# Patient Record
Sex: Female | Born: 1950 | Race: Black or African American | Hispanic: No | Marital: Single | Smoking: Never smoker
Health system: Southern US, Community
[De-identification: ages and names within clinical notes are randomized; demographics above are authoritative.]

## PROBLEM LIST (undated history)

## (undated) DIAGNOSIS — R609 Edema, unspecified: Secondary | ICD-10-CM

## (undated) DIAGNOSIS — I10 Essential (primary) hypertension: Secondary | ICD-10-CM

## (undated) DIAGNOSIS — E785 Hyperlipidemia, unspecified: Secondary | ICD-10-CM

## (undated) DIAGNOSIS — E669 Obesity, unspecified: Secondary | ICD-10-CM

## (undated) HISTORY — PX: CATARACT EXTRACTION: SUR2

## (undated) HISTORY — DX: Hyperlipidemia, unspecified: E78.5

## (undated) HISTORY — DX: Essential (primary) hypertension: I10

## (undated) HISTORY — DX: Obesity, unspecified: E66.9

## (undated) HISTORY — DX: Edema, unspecified: R60.9

## (undated) HISTORY — PX: EYE SURGERY: SHX253

---

## 1998-09-21 ENCOUNTER — Ambulatory Visit (HOSPITAL_COMMUNITY): Admission: RE | Admit: 1998-09-21 | Discharge: 1998-09-21 | Payer: Self-pay

## 2002-10-17 ENCOUNTER — Ambulatory Visit (HOSPITAL_COMMUNITY): Admission: RE | Admit: 2002-10-17 | Discharge: 2002-10-17 | Payer: Self-pay | Admitting: Family Medicine

## 2003-12-12 ENCOUNTER — Ambulatory Visit (HOSPITAL_COMMUNITY): Admission: RE | Admit: 2003-12-12 | Discharge: 2003-12-12 | Payer: Self-pay | Admitting: Family Medicine

## 2004-01-07 ENCOUNTER — Ambulatory Visit: Payer: Self-pay | Admitting: Family Medicine

## 2004-01-09 ENCOUNTER — Ambulatory Visit: Payer: Self-pay | Admitting: Family Medicine

## 2004-02-04 ENCOUNTER — Ambulatory Visit: Payer: Self-pay | Admitting: *Deleted

## 2004-04-14 ENCOUNTER — Ambulatory Visit: Payer: Self-pay | Admitting: Family Medicine

## 2004-05-10 ENCOUNTER — Ambulatory Visit: Payer: Self-pay | Admitting: Family Medicine

## 2004-06-15 ENCOUNTER — Emergency Department (HOSPITAL_COMMUNITY): Admission: EM | Admit: 2004-06-15 | Discharge: 2004-06-15 | Payer: Self-pay | Admitting: Emergency Medicine

## 2004-06-18 ENCOUNTER — Ambulatory Visit: Payer: Self-pay | Admitting: Family Medicine

## 2004-08-04 ENCOUNTER — Ambulatory Visit: Payer: Self-pay | Admitting: Family Medicine

## 2005-01-03 ENCOUNTER — Ambulatory Visit: Payer: Self-pay | Admitting: Family Medicine

## 2005-01-04 ENCOUNTER — Ambulatory Visit: Payer: Self-pay | Admitting: Family Medicine

## 2005-02-21 ENCOUNTER — Ambulatory Visit: Payer: Self-pay | Admitting: Family Medicine

## 2005-05-02 ENCOUNTER — Ambulatory Visit: Payer: Self-pay | Admitting: Family Medicine

## 2005-05-11 ENCOUNTER — Ambulatory Visit (HOSPITAL_COMMUNITY): Admission: RE | Admit: 2005-05-11 | Discharge: 2005-05-11 | Payer: Self-pay | Admitting: Family Medicine

## 2005-07-11 ENCOUNTER — Ambulatory Visit: Payer: Self-pay | Admitting: Family Medicine

## 2005-08-23 ENCOUNTER — Ambulatory Visit: Payer: Self-pay | Admitting: Family Medicine

## 2005-11-04 ENCOUNTER — Ambulatory Visit: Payer: Self-pay | Admitting: Family Medicine

## 2006-01-02 ENCOUNTER — Ambulatory Visit: Payer: Self-pay | Admitting: Family Medicine

## 2006-03-22 ENCOUNTER — Ambulatory Visit: Payer: Self-pay | Admitting: Family Medicine

## 2006-05-30 ENCOUNTER — Encounter (INDEPENDENT_AMBULATORY_CARE_PROVIDER_SITE_OTHER): Payer: Self-pay | Admitting: Family Medicine

## 2006-05-30 ENCOUNTER — Ambulatory Visit: Payer: Self-pay | Admitting: Family Medicine

## 2006-05-31 ENCOUNTER — Encounter (INDEPENDENT_AMBULATORY_CARE_PROVIDER_SITE_OTHER): Payer: Self-pay | Admitting: Family Medicine

## 2006-06-20 ENCOUNTER — Ambulatory Visit: Payer: Self-pay | Admitting: Family Medicine

## 2006-06-29 ENCOUNTER — Ambulatory Visit (HOSPITAL_COMMUNITY): Admission: RE | Admit: 2006-06-29 | Discharge: 2006-06-29 | Payer: Self-pay | Admitting: Family Medicine

## 2006-07-22 ENCOUNTER — Emergency Department (HOSPITAL_COMMUNITY): Admission: EM | Admit: 2006-07-22 | Discharge: 2006-07-22 | Payer: Self-pay | Admitting: Emergency Medicine

## 2006-10-02 ENCOUNTER — Ambulatory Visit: Payer: Self-pay | Admitting: Internal Medicine

## 2006-10-09 ENCOUNTER — Encounter (INDEPENDENT_AMBULATORY_CARE_PROVIDER_SITE_OTHER): Payer: Self-pay | Admitting: Family Medicine

## 2006-10-09 DIAGNOSIS — I10 Essential (primary) hypertension: Secondary | ICD-10-CM | POA: Insufficient documentation

## 2006-10-09 DIAGNOSIS — E785 Hyperlipidemia, unspecified: Secondary | ICD-10-CM | POA: Insufficient documentation

## 2006-10-09 DIAGNOSIS — E119 Type 2 diabetes mellitus without complications: Secondary | ICD-10-CM

## 2006-10-09 DIAGNOSIS — G510 Bell's palsy: Secondary | ICD-10-CM | POA: Insufficient documentation

## 2006-10-09 DIAGNOSIS — D509 Iron deficiency anemia, unspecified: Secondary | ICD-10-CM | POA: Insufficient documentation

## 2006-10-09 DIAGNOSIS — E1142 Type 2 diabetes mellitus with diabetic polyneuropathy: Secondary | ICD-10-CM | POA: Insufficient documentation

## 2006-10-09 DIAGNOSIS — E669 Obesity, unspecified: Secondary | ICD-10-CM | POA: Insufficient documentation

## 2006-12-04 ENCOUNTER — Ambulatory Visit: Payer: Self-pay | Admitting: Internal Medicine

## 2006-12-04 ENCOUNTER — Encounter (INDEPENDENT_AMBULATORY_CARE_PROVIDER_SITE_OTHER): Payer: Self-pay | Admitting: Family Medicine

## 2006-12-04 LAB — CONVERTED CEMR LAB
BUN: 16 mg/dL (ref 6–23)
CO2: 22 meq/L (ref 19–32)
Calcium: 9.3 mg/dL (ref 8.4–10.5)
Chloride: 107 meq/L (ref 96–112)
Cholesterol: 149 mg/dL (ref 0–200)
Creatinine, Ser: 0.8 mg/dL (ref 0.40–1.20)
HDL: 79 mg/dL (ref >39)
LDL Cholesterol: 63 mg/dL (ref 0–99)
Total CHOL/HDL Ratio: 1.9
VLDL: 7 mg/dL (ref 0–40)

## 2007-01-10 ENCOUNTER — Encounter (INDEPENDENT_AMBULATORY_CARE_PROVIDER_SITE_OTHER): Payer: Self-pay | Admitting: *Deleted

## 2007-04-16 ENCOUNTER — Ambulatory Visit: Payer: Self-pay | Admitting: Internal Medicine

## 2007-04-16 ENCOUNTER — Encounter (INDEPENDENT_AMBULATORY_CARE_PROVIDER_SITE_OTHER): Payer: Self-pay | Admitting: Family Medicine

## 2007-04-16 LAB — CONVERTED CEMR LAB
BUN: 13 mg/dL (ref 6–23)
CO2: 23 meq/L (ref 19–32)
Cholesterol: 170 mg/dL (ref 0–200)
Glucose, Bld: 105 mg/dL — ABNORMAL HIGH (ref 70–99)
HDL: 83 mg/dL (ref >39)
Potassium: 4.1 meq/L (ref 3.5–5.3)
Sodium: 138 meq/L (ref 135–145)
Total CHOL/HDL Ratio: 2

## 2007-05-11 ENCOUNTER — Encounter (INDEPENDENT_AMBULATORY_CARE_PROVIDER_SITE_OTHER): Payer: Self-pay | Admitting: Internal Medicine

## 2007-05-11 ENCOUNTER — Ambulatory Visit: Payer: Self-pay | Admitting: Family Medicine

## 2007-07-16 ENCOUNTER — Ambulatory Visit (HOSPITAL_COMMUNITY): Admission: RE | Admit: 2007-07-16 | Discharge: 2007-07-16 | Payer: Self-pay | Admitting: Family Medicine

## 2007-08-29 ENCOUNTER — Ambulatory Visit: Payer: Self-pay | Admitting: Internal Medicine

## 2008-02-27 ENCOUNTER — Ambulatory Visit: Payer: Self-pay | Admitting: Internal Medicine

## 2008-02-27 LAB — CONVERTED CEMR LAB
BUN: 14 mg/dL (ref 6–23)
CO2: 24 meq/L (ref 19–32)
Creatinine, Ser: 0.73 mg/dL (ref 0.40–1.20)
Potassium: 4 meq/L (ref 3.5–5.3)

## 2008-08-07 ENCOUNTER — Ambulatory Visit (HOSPITAL_COMMUNITY): Admission: RE | Admit: 2008-08-07 | Discharge: 2008-08-07 | Payer: Self-pay | Admitting: Internal Medicine

## 2008-09-19 ENCOUNTER — Ambulatory Visit: Payer: Self-pay | Admitting: Family Medicine

## 2008-10-28 ENCOUNTER — Ambulatory Visit: Payer: Self-pay | Admitting: Internal Medicine

## 2008-12-15 ENCOUNTER — Ambulatory Visit: Payer: Self-pay | Admitting: Internal Medicine

## 2008-12-15 ENCOUNTER — Encounter (INDEPENDENT_AMBULATORY_CARE_PROVIDER_SITE_OTHER): Payer: Self-pay | Admitting: Adult Health

## 2008-12-15 LAB — CONVERTED CEMR LAB
Albumin: 4 g/dL (ref 3.5–5.2)
Alkaline Phosphatase: 74 units/L (ref 39–117)
BUN: 11 mg/dL (ref 6–23)
CO2: 23 meq/L (ref 19–32)
Chlamydia, DNA Probe: NEGATIVE
Creatinine, Ser: 0.85 mg/dL (ref 0.40–1.20)
Eosinophils Relative: 1 % (ref 0–5)
HDL: 76 mg/dL (ref >39)
MCV: 80.9 fL (ref 78.0–100.0)
Monocytes Relative: 6 % (ref 3–12)
Neutro Abs: 2.7 10*3/uL (ref 1.7–7.7)
Neutrophils Relative %: 55 % (ref 43–77)
Platelets: 269 10*3/uL (ref 150–400)
RBC: 4.6 M/uL (ref 3.87–5.11)
RDW: 16.5 % — ABNORMAL HIGH (ref 11.5–15.5)
Total Bilirubin: 0.3 mg/dL (ref 0.3–1.2)
Total Protein: 7.7 g/dL (ref 6.0–8.3)

## 2009-02-25 ENCOUNTER — Ambulatory Visit: Payer: Self-pay | Admitting: Internal Medicine

## 2009-04-08 ENCOUNTER — Ambulatory Visit: Payer: Self-pay | Admitting: Internal Medicine

## 2009-05-13 ENCOUNTER — Ambulatory Visit: Payer: Self-pay | Admitting: Family Medicine

## 2009-06-08 ENCOUNTER — Ambulatory Visit: Payer: Self-pay | Admitting: Internal Medicine

## 2009-06-08 ENCOUNTER — Encounter (INDEPENDENT_AMBULATORY_CARE_PROVIDER_SITE_OTHER): Payer: Self-pay | Admitting: Adult Health

## 2009-06-08 LAB — CONVERTED CEMR LAB
ALT: 17 units/L (ref 0–35)
AST: 17 units/L (ref 0–37)
Albumin: 4.3 g/dL (ref 3.5–5.2)
Calcium: 10.1 mg/dL (ref 8.4–10.5)
Chloride: 100 meq/L (ref 96–112)
Cholesterol: 149 mg/dL (ref 0–200)
Creatinine, Ser: 0.86 mg/dL (ref 0.40–1.20)
Glucose, Bld: 140 mg/dL — ABNORMAL HIGH (ref 70–99)
HDL: 69 mg/dL (ref >39)
Hgb A1c MFr Bld: 9.6 % — ABNORMAL HIGH (ref 4.6–6.1)
LDL Cholesterol: 67 mg/dL (ref 0–99)
Sodium: 139 meq/L (ref 135–145)
Total Protein: 7.9 g/dL (ref 6.0–8.3)
Triglycerides: 66 mg/dL (ref <150)
VLDL: 13 mg/dL (ref 0–40)

## 2009-07-07 ENCOUNTER — Ambulatory Visit: Payer: Self-pay | Admitting: Internal Medicine

## 2009-09-02 ENCOUNTER — Ambulatory Visit: Payer: Self-pay | Admitting: Internal Medicine

## 2009-09-04 ENCOUNTER — Ambulatory Visit (HOSPITAL_COMMUNITY): Admission: RE | Admit: 2009-09-04 | Discharge: 2009-09-04 | Payer: Self-pay | Admitting: Internal Medicine

## 2009-10-13 ENCOUNTER — Ambulatory Visit: Payer: Self-pay | Admitting: Internal Medicine

## 2009-11-10 ENCOUNTER — Ambulatory Visit: Payer: Self-pay | Admitting: Internal Medicine

## 2009-11-10 ENCOUNTER — Encounter (INDEPENDENT_AMBULATORY_CARE_PROVIDER_SITE_OTHER): Payer: Self-pay | Admitting: Adult Health

## 2009-11-10 LAB — CONVERTED CEMR LAB: Microalb, Ur: 0.5 mg/dL (ref 0.00–1.89)

## 2010-07-08 ENCOUNTER — Encounter (INDEPENDENT_AMBULATORY_CARE_PROVIDER_SITE_OTHER): Payer: Self-pay | Admitting: *Deleted

## 2010-07-08 LAB — CONVERTED CEMR LAB
BUN: 15 mg/dL (ref 6–23)
Basophils Absolute: 0 10*3/uL (ref 0.0–0.1)
CO2: 26 meq/L (ref 19–32)
Calcium: 9.7 mg/dL (ref 8.4–10.5)
Creatinine, Ser: 0.87 mg/dL (ref 0.40–1.20)
Eosinophils Relative: 1 % (ref 0–5)
HCT: 37.5 % (ref 36.0–46.0)
HDL: 76 mg/dL (ref >39)
Lymphocytes Relative: 38 % (ref 12–46)
MCHC: 32 g/dL (ref 30.0–36.0)
Monocytes Absolute: 0.5 10*3/uL (ref 0.1–1.0)
Potassium: 4.5 meq/L (ref 3.5–5.3)
RDW: 15.6 % — ABNORMAL HIGH (ref 11.5–15.5)
TSH: 0.78 microintl units/mL (ref 0.350–4.500)
Total Bilirubin: 0.4 mg/dL (ref 0.3–1.2)
Total Protein: 7.8 g/dL (ref 6.0–8.3)
VLDL: 11 mg/dL (ref 0–40)

## 2010-09-27 ENCOUNTER — Other Ambulatory Visit (HOSPITAL_COMMUNITY): Payer: Self-pay | Admitting: Family Medicine

## 2010-09-27 DIAGNOSIS — Z1231 Encounter for screening mammogram for malignant neoplasm of breast: Secondary | ICD-10-CM

## 2010-10-07 ENCOUNTER — Ambulatory Visit (HOSPITAL_COMMUNITY): Payer: Self-pay

## 2010-10-20 ENCOUNTER — Ambulatory Visit (HOSPITAL_COMMUNITY)
Admission: RE | Admit: 2010-10-20 | Discharge: 2010-10-20 | Disposition: A | Discharge status: Home / Self Care | Payer: Self-pay | Source: Ambulatory Visit | Attending: Family Medicine | Admitting: Family Medicine

## 2010-10-20 DIAGNOSIS — Z1231 Encounter for screening mammogram for malignant neoplasm of breast: Secondary | ICD-10-CM | POA: Insufficient documentation

## 2011-08-07 ENCOUNTER — Emergency Department (HOSPITAL_COMMUNITY): Payer: Self-pay

## 2011-08-07 ENCOUNTER — Emergency Department (HOSPITAL_COMMUNITY)
Admission: EM | Admit: 2011-08-07 | Discharge: 2011-08-07 | Disposition: A | Discharge status: Home / Self Care | Payer: Self-pay | Attending: Emergency Medicine | Admitting: Emergency Medicine

## 2011-08-07 ENCOUNTER — Encounter (HOSPITAL_COMMUNITY): Payer: Self-pay | Admitting: *Deleted

## 2011-08-07 DIAGNOSIS — I10 Essential (primary) hypertension: Secondary | ICD-10-CM | POA: Insufficient documentation

## 2011-08-07 DIAGNOSIS — M25551 Pain in right hip: Secondary | ICD-10-CM

## 2011-08-07 DIAGNOSIS — M25559 Pain in unspecified hip: Secondary | ICD-10-CM | POA: Insufficient documentation

## 2011-08-07 DIAGNOSIS — E119 Type 2 diabetes mellitus without complications: Secondary | ICD-10-CM | POA: Insufficient documentation

## 2011-08-07 HISTORY — DX: Essential (primary) hypertension: I10

## 2011-08-07 LAB — GLUCOSE, CAPILLARY: Glucose-Capillary: 284 mg/dL — ABNORMAL HIGH (ref 70–99)

## 2011-08-07 MED ORDER — IBUPROFEN 800 MG PO TABS: 800.0000 mg | ORAL_TABLET | Freq: Three times a day (TID) | ORAL | Status: AC | PRN

## 2011-08-07 MED ORDER — DIAZEPAM 5 MG PO TABS: 5.0000 mg | ORAL_TABLET | Freq: Three times a day (TID) | ORAL | Status: AC | PRN

## 2011-08-07 NOTE — ED Provider Notes (Signed)
History     CSN: [id number]  Arrival date & time 08/07/11  1016   First MD Initiated Contact with Patient 08/07/11 1221      Chief Complaint  Patient presents with  . Hip Pain    (Consider location/radiation/quality/duration/timing/severity/associated sxs/prior treatment) HPI Comments: Patient reports right hip pain that began while walking 4 days ago.  States the pain is exacerbated with walking and putting pressure on her leg.  States if she is standing still it does not hurt.  Pain is described as sharp and 10/10.  She has taken aspirin without improvement.  Denies fall or other injury.  Has never been diagnosed with osteoporosis.  Denies back pain, abdominal pain, loss of control of bowel or bladder, weakness or numbness of the legs.   Patient is a 61 y.o. female presenting with hip pain. The history is provided by the patient.  Hip Pain Pertinent negatives include no abdominal pain, chest pain, fever, numbness, vomiting or weakness.    Past Medical History  Diagnosis Date  . Diabetes mellitus   . Hypertension     Past Surgical History  Procedure Date  . Cesarean section     No family history on file.  History  Substance Use Topics  . Smoking status: Never Smoker   . Smokeless tobacco: Not on file  . Alcohol Use: No    OB History    Grav Para Term Preterm Abortions TAB SAB Ect Mult Living                  Review of Systems  Constitutional: Negative for fever.  Respiratory: Negative for shortness of breath.   Cardiovascular: Negative for chest pain.  Gastrointestinal: Negative for vomiting, abdominal pain and diarrhea.  Musculoskeletal: Negative for back pain.  Neurological: Negative for weakness and numbness.    Allergies  Hydrochlorothiazide  Home Medications   Current Outpatient Rx  Name Route Sig Dispense Refill  . ASPIRIN EC 81 MG PO TBEC Oral Take 81 mg by mouth daily.    . FUROSEMIDE 40 MG PO TABS Oral Take 40 mg by mouth daily.    Marland Kitchen  GLIPIZIDE ER 10 MG PO TB24 Oral Take 10 mg by mouth daily.    Marland Kitchen LOSARTAN POTASSIUM 100 MG PO TABS Oral Take 100 mg by mouth daily.    Marland Kitchen METFORMIN HCL 1000 MG PO TABS Oral Take 1,000 mg by mouth 2 (two) times daily with a meal.    . METOPROLOL SUCCINATE ER 100 MG PO TB24 Oral Take 100 mg by mouth daily. Take with or immediately following a meal.    . ROSUVASTATIN CALCIUM 5 MG PO TABS Oral Take 5 mg by mouth daily.    Marland Kitchen TERAZOSIN HCL 1 MG PO CAPS Oral Take 1 mg by mouth at bedtime.      BP 175/80  Pulse 88  Temp(Src) 98.6 F (37 C) (Oral)  Resp 20  SpO2 99%  Physical Exam  Nursing note and vitals reviewed. Constitutional: She is oriented to person, place, and time. She appears well-developed and well-nourished.  HENT:  Head: Normocephalic and atraumatic.  Neck: Neck supple.  Pulmonary/Chest: Effort normal.  Musculoskeletal: She exhibits no edema.       Right hip: She exhibits tenderness and bony tenderness. She exhibits no crepitus and no deformity.       Legs:      Right hip tender to palpation laterally.  Exam is limited due to patient's body habitus.  Distal pulses intact,  sensation intact.  Pt stands with no pain or difficulty, pain with flexing hip and walking forward.  Neurological: She is alert and oriented to person, place, and time.    ED Course  Procedures (including critical care time)  Labs Reviewed  GLUCOSE, CAPILLARY - Abnormal; Notable for the following:    Glucose-Capillary 284 (*)    All other components within normal limits   Dg Hip Complete Right  08/07/2011  *RADIOLOGY REPORT*  Clinical Data: Right hip pain.  RIGHT HIP - COMPLETE 2+ VIEW  Comparison: None.  Findings: The right hip is located.  No acute bone or soft tissue abnormality is evident.  Mild degenerative changes are noted in the SI joints bilaterally.  IMPRESSION:  1.  Negative right hip. 2.  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  Original Report Authenticated By: Jamesetta Orleans.  MATTERN, M.D.   Ct Hip Right Wo Contrast  08/07/2011  *RADIOLOGY REPORT*  Clinical Data: Right hip pain with limited weightbearing.  No known injury.  CT OF THE RIGHT HIP WITHOUT CONTRAST  Technique:  Multidetector CT imaging was performed according to the standard protocol. Multiplanar CT image reconstructions were also generated.  Comparison: Radiographs same date.  Findings: Study is limited to the right hip and inferior right hemi pelvis.  The mineralization and alignment are normal.  There is no evidence of acute fracture or dislocation.  There is no evidence of femoral head osteonecrosis.  The hip joint spaces appear adequately maintained.  There are enthesophytes at the greater and lesser trochanters.  Degenerative changes at the symphysis pubis and right sacroiliac joint are partially imaged.  No significant hip joint effusion or periarticular soft tissue abnormality is identified.  IMPRESSION:  1.  No acute right hip osseous findings. 2.  Mild degenerative changes at the symphysis pubis and sacroiliac joints, incompletely imaged.  Further evaluation with MRI should be considered if the patient has persistent unexplained pain, preferred modality in the atraumatic setting.  Original Report Authenticated By: Gerrianne Scale, M.D.     1. Right hip pain       MDM  Afebrile patient with right hip pain x 4 days.  Patient is able to stand and bear weight but has pain with walking and moving around.  Xray and CT of her hip are negative.  Patient has had no known injury.  Likely muscle strain or ligamentous strain.  Pt refused pain medication in the ED.  I d/c her home with ibuprofen and valium, orthopedic and PCP follow up.  Discussed return precautions, discussed fall risk with medications, advised her to avoid staying completely sedentary.  Patient verbalizes understanding and agrees with plan.          Dillard Cannon [place], [place] 08/07/11 1605

## 2011-08-07 NOTE — ED Notes (Signed)
Pt reports right sided hip pain that 10/10 when she puts weight on her right leg. Pt denies pain without movement or in a sitting or laying position.  Pt denies history of injury. Pain began on Thursday.  Pt denies history of back issues or moving/lifting heavy objects. Pt reports a history of pain to leg that was sometimes associated with swelling, but leg is currently not swollen or red.

## 2011-08-07 NOTE — Discharge Instructions (Signed)
Read the information below.  Please call the orthopedist for a follow up appointment.  Return to the ER if you have worsening pain, fevers, or weakness or numbness of your leg.  You may return to the ER at any time for worsening condition or any new symptoms that concern you.  Arthralgia Arthralgia is joint pain. A joint is a place where two bones meet. Joint pain can happen for many reasons. The joint can be bruised, stiff, infected, or weak from aging. Pain usually goes away after resting and taking medicine for soreness.  HOME CARE  Rest the joint as told by your doctor.   Keep the sore joint raised (elevated) for the first 24 hours.   Put ice on the joint area.   Put ice in a plastic bag.   Place a towel between your skin and the bag.   Leave the ice on for 15 to 20 minutes, 3 to 4 times a day.   Wear your splint, casting, elastic bandage, or sling as told by your doctor.   Only take medicine as told by your doctor. Do not take aspirin.   Use crutches as told by your doctor. Do not put weight on the joint until told to by your doctor.  GET HELP RIGHT AWAY IF:   You have bruising, puffiness (swelling), or more pain.   Your fingers or toes turn blue or start to lose feeling (numb).   Your medicine does not lessen the pain.   Your pain becomes severe.   You have a temperature by mouth above 102 F (38.9 C), not controlled by medicine.   You cannot move or use the joint.  MAKE SURE YOU:   Understand these instructions.   Will watch your condition.   Will get help right away if you are not doing well or get worse.  Document Released: 03/30/2009 Document Revised: 03/31/2011 Document Reviewed: 03/30/2009 [facility] Patient Information 2012 [place], [place].  Hip Pain The hips join the upper legs to the lower pelvis. The bones, cartilage, tendons, and muscles of the hip joint perform a lot of work each day holding your body weight and allowing you to move around. Hip pain is  a common symptom. It can range from a minor ache to severe pain on 1 or both hips. Pain may be felt on the inside of the hip joint near the groin, or the outside near the buttocks and upper thigh. There may be swelling or stiffness as well. It occurs more often when a person walks or performs activity. There are many reasons hip pain can develop. CAUSES  It is important to work with your caregiver to identify the cause since many conditions can impact the bones, cartilage, muscles, and tendons of the hips. Causes for hip pain include:  Broken (fractured) bones.   Separation of the thighbone from the hip socket (dislocation).   Torn cartilage of the hip joint.   Swelling (inflammation) of a tendon (tendonitis), the sac within the hip joint (bursitis), or a joint.   A weakening in the abdominal wall (hernia), affecting the nerves to the hip.   Arthritis in the hip joint or lining of the hip joint.   Pinched nerves in the back, hip, or upper thigh.   A bulging disc in the spine (herniated disc).   Rarely, bone infection or cancer.  DIAGNOSIS  The location of your hip pain will help your caregiver understand what may be causing the pain. A diagnosis is based  on your medical history, your symptoms, results from your physical exam, and results from diagnostic tests. Diagnostic tests may include X-ray exams, a computerized magnetic scan (magnetic resonance imaging, MRI), or bone scan. TREATMENT  Treatment will depend on the cause of your hip pain. Treatment may include:  Limiting activities and resting until symptoms improve.   Crutches or other walking supports (a cane or brace).   Ice, elevation, and compression.   Physical therapy or home exercises.   Shoe inserts or special shoes.   Losing weight.   Medications to reduce pain.   Undergoing surgery.  HOME CARE INSTRUCTIONS   Only take over-the-counter or prescription medicines for pain, discomfort, or fever as directed by your  caregiver.   Put ice on the injured area:   Put ice in a plastic bag.   Place a towel between your skin and the bag.   Leave the ice on for 15 to 20 minutes at a time, 3 to 4 times a day.   Keep your leg raised (elevated) when possible to lessen swelling.   Avoid activities that cause pain.   Follow specific exercises as directed by your caregiver.   Sleep with a pillow between your legs on your most comfortable side.   Record how often you have hip pain, the location of the pain, and what it feels like. This information may be helpful to you and your caregiver.   Ask your caregiver about returning to work or sports and whether you should drive.   Follow up with your caregiver for further exams, therapy, or testing as directed.  SEEK MEDICAL CARE IF:   Your pain or swelling continues or worsens after 1 week.   You are feeling unwell or have chills.   You have increasing difficulty with walking.   You have a loss of sensation or other new symptoms.   You have questions or concerns.  SEEK IMMEDIATE MEDICAL CARE IF:   You cannot put weight on the affected hip.   You have fallen.   You have a sudden increase in pain and swelling in your hip.   You have a fever.  MAKE SURE YOU:   Understand these instructions.   Will watch your condition.   Will get help right away if you are not doing well or get worse.  Document Released: 09/29/2009 Document Revised: 03/31/2011 Document Reviewed: 09/29/2009 [facility] Patient Information 2012 [place], [place].

## 2011-08-09 NOTE — ED Provider Notes (Signed)
Medical screening examination/treatment/procedure(s) were performed by non-physician practitioner and as supervising physician I was immediately available for consultation/collaboration.   Geoffery Lyons, MD 08/09/11 [phone]

## 2011-11-14 ENCOUNTER — Other Ambulatory Visit (HOSPITAL_COMMUNITY): Payer: Self-pay | Admitting: Family Medicine

## 2011-11-14 DIAGNOSIS — Z1231 Encounter for screening mammogram for malignant neoplasm of breast: Secondary | ICD-10-CM

## 2011-12-02 ENCOUNTER — Ambulatory Visit (HOSPITAL_COMMUNITY)
Admission: RE | Admit: 2011-12-02 | Discharge: 2011-12-02 | Disposition: A | Discharge status: Home / Self Care | Payer: Self-pay | Source: Ambulatory Visit | Attending: Family Medicine | Admitting: Family Medicine

## 2011-12-02 DIAGNOSIS — Z1231 Encounter for screening mammogram for malignant neoplasm of breast: Secondary | ICD-10-CM | POA: Insufficient documentation

## 2012-04-16 ENCOUNTER — Encounter (HOSPITAL_COMMUNITY): Payer: Self-pay

## 2012-04-16 ENCOUNTER — Emergency Department (HOSPITAL_COMMUNITY)
Admission: EM | Admit: 2012-04-16 | Discharge: 2012-04-16 | Disposition: A | Discharge status: Home / Self Care | Payer: No Typology Code available for payment source | Source: Home / Self Care

## 2012-04-16 DIAGNOSIS — E119 Type 2 diabetes mellitus without complications: Secondary | ICD-10-CM

## 2012-04-16 DIAGNOSIS — E785 Hyperlipidemia, unspecified: Secondary | ICD-10-CM

## 2012-04-16 DIAGNOSIS — I10 Essential (primary) hypertension: Secondary | ICD-10-CM

## 2012-04-16 DIAGNOSIS — E669 Obesity, unspecified: Secondary | ICD-10-CM

## 2012-04-16 MED ORDER — CLONIDINE HCL 0.1 MG PO TABS
ORAL_TABLET | ORAL | Status: AC
  Filled 2012-04-16: qty 1

## 2012-04-16 MED ORDER — ASPIRIN EC 81 MG PO TBEC: 81.0000 mg | DELAYED_RELEASE_TABLET | Freq: Every day | ORAL | Status: DC

## 2012-04-16 MED ORDER — FUROSEMIDE 40 MG PO TABS: 40.0000 mg | ORAL_TABLET | Freq: Every day | ORAL | Status: DC

## 2012-04-16 MED ORDER — TERAZOSIN HCL 1 MG PO CAPS: 2.0000 mg | ORAL_CAPSULE | Freq: Every day | ORAL | Status: DC

## 2012-04-16 MED ORDER — METFORMIN HCL 1000 MG PO TABS: 1000.0000 mg | ORAL_TABLET | Freq: Two times a day (BID) | ORAL | Status: DC

## 2012-04-16 MED ORDER — ASPIRIN EC 81 MG PO TBEC: 81.0000 mg | DELAYED_RELEASE_TABLET | Freq: Every day | ORAL | Status: AC

## 2012-04-16 MED ORDER — BENAZEPRIL HCL 40 MG PO TABS: 40.0000 mg | ORAL_TABLET | Freq: Every day | ORAL | Status: DC

## 2012-04-16 MED ORDER — METOPROLOL TARTRATE 50 MG PO TABS: 50.0000 mg | ORAL_TABLET | Freq: Two times a day (BID) | ORAL | Status: DC

## 2012-04-16 MED ORDER — CLONIDINE HCL 0.1 MG PO TABS
0.1000 mg | ORAL_TABLET | Freq: Once | ORAL | Status: AC
  Administered 2012-04-16: 0.1 mg via ORAL

## 2012-04-16 NOTE — ED Provider Notes (Addendum)
Patient Demographics  Ann Gomez, is a 61 y.o. female  WUJ:[id number]  NFA:[id number]  DOB - [birth year]  Chief Complaint  Patient presents with  . Hypertension        Subjective:   Ann Gomez today is here for a follow up vist/to establish primary care. Patient is supposed to take 4 antihypertensive medications, but she ran out of one and only takes 3 (not clear which she ran out of). Patient has No headache, No chest pain, No abdominal pain - No Nausea, No new weakness tingling or numbness, No Cough - SOB.   Objective:    Filed Vitals:   04/16/12 1032  BP: 206/93  Pulse: 70  Temp: 98 F (36.7 C)  TempSrc: Oral  Resp: 19  SpO2: 100%     ALLERGIES:   Allergies  Allergen Reactions  . Hydrochlorothiazide Other (See Comments)    dizzy    PAST MEDICAL HISTORY: Past Medical History  Diagnosis Date  . Diabetes mellitus   . Hypertension     PAST SURGICAL HISTORY: Past Surgical History  Procedure Date  . Cesarean section     FAMILY HISTORY: No family history of coronary artery disease or CVA Mother-bronchial asthma  MEDICATIONS AT HOME: Prior to Admission medications   Medication Sig Start Date End Date Taking? Authorizing Provider  aspirin EC 81 MG tablet Take 1 tablet (81 mg total) by mouth daily. 04/16/12   Shanker Levora Dredge, MD  benazepril (LOTENSIN) 40 MG tablet Take 1 tablet (40 mg total) by mouth daily. 04/16/12   Shanker Levora Dredge, MD  furosemide (LASIX) 40 MG tablet Take 1 tablet (40 mg total) by mouth daily. 04/16/12   Shanker Levora Dredge, MD  metFORMIN (GLUCOPHAGE) 1000 MG tablet Take 1 tablet (1,000 mg total) by mouth 2 (two) times daily with a meal. 04/16/12   Shanker Levora Dredge, MD  metoprolol (LOPRESSOR) 50 MG tablet Take 1 tablet (50 mg total) by mouth 2 (two) times daily. 04/16/12   Shanker Levora Dredge, MD  terazosin (HYTRIN) 1 MG capsule Take 2 capsules (2 mg total) by mouth at bedtime. 04/16/12   Shanker Levora Dredge, MD    REVIEW OF SYSTEMS:   Constitutional:   No   Fevers, chills, fatigue.  HEENT:    No headaches, Sore throat,   Cardio-vascular: No chest pain,  Orthopnea, swelling in lower extremities, anasarca, palpitations  GI:  No abdominal pain, nausea, vomiting, diarrhea  Resp: No shortness of breath,  No coughing up of blood.No cough.No wheezing.  Skin:  no rash or lesions.  GU:  no dysuria, change in color of urine, no urgency or frequency.  No flank pain.  Musculoskeletal: No joint pain or swelling.  No decreased range of motion.  No back pain.  Psych: No change in mood or affect. No depression or anxiety.  No memory loss.   Exam  General appearance :Awake, alert, not in any distress. Speech Clear. Not toxic Looking HEENT: Atraumatic and Normocephalic, pupils equally reactive to light and accomodation Neck: supple, no JVD. No cervical lymphadenopathy.  Chest:Good air entry bilaterally, no added sounds  CVS: S1 S2 regular, no murmurs.  Abdomen: Bowel sounds present, Non tender and not distended with no gaurding, rigidity or rebound. Extremities: B/L Lower Ext shows no edema, both legs are warm to touch Neurology: Awake alert, and oriented X 3, CN II-XII intact, Non focal Skin:No Rash Wounds:N/A    Data Review   CBC No results found for this basename: WBC:5,HGB:5,HCT:5,PLT:5,MCV:5,MCH:5,MCHC:5,RDW:5,NEUTRABS:5,LYMPHSABS:5,MONOABS:5,EOSABS:5,BASOSABS:5,BANDABS:5,BANDSABD:5 in the  last 168 hours  Chemistries   No results found for this basename: NA:5,K:5,CL:5,CO2:5,GLUCOSE:5,BUN:5,CREATININE:5,GFRCGP,:5,CALCIUM:5,MG:5,AST:5,ALT:5,ALKPHOS:5,BILITOT:5 in the last 168 hours ------------------------------------------------------------------------------------------------------------------ No results found for this basename: HGBA1C:2 in the last 72 hours ------------------------------------------------------------------------------------------------------------------ No results found for this  basename: CHOL:2,HDL:2,LDLCALC:2,TRIG:2,CHOLHDL:2,LDLDIRECT:2 in the last 72 hours ------------------------------------------------------------------------------------------------------------------ No results found for this basename: TSH,T4TOTAL,FREET3,T3FREE,THYROIDAB in the last 72 hours ------------------------------------------------------------------------------------------------------------------ No results found for this basename: VITAMINB12:2,FOLATE:2,FERRITIN:2,TIBC:2,IRON:2,RETICCTPCT:2 in the last 72 hours  Coagulation profile  No results found for this basename: INR:5,PROTIME:5 in the last 168 hours    Assessment & Plan   #1 uncontrolled hypertension - She's only taking three out of four for medications - Will give one dose of clonidine-recheck of BP- systolic blood pressure down in the 170s. She will discharge. - Patient on numerous medications-that are not on the 4 dollar list, we'll change her medications are the following  - Benazepril 40 mg daily (previously on Cozaar)  - Metoprolol 50 mg twice daily ( previously on Toprol-XL 100 mg daily)   - Terazosin 2 mg daily (previously on 1 mg)  - Lasix 40 mg daily Followup in one week - to recheck BP and further adjust medications   #2 diabetes - Continue with Lantus and metformin. Patient claims she has enough supply of Lantus last a month or so. - Check A1c next visit  #3 dyslipidemia pravastatin 40 mg daily (previously on Crestor 5 mg daily-calledd pharmacy- Equivalent dosing of Prilosec that is 40 mg of Pravachol)  Followup in one week to recheck blood pressure. Hopefully she can get her labs prior to next visit   Follow-up Information    Follow up with [facility]. Schedule an appointment as soon as possible for a visit in 7 days.          Maretta Bees, MD 04/16/12 1116  Dewayne Shorter Levora Dredge, MD 04/16/12 1229

## 2012-04-16 NOTE — ED Notes (Signed)
Medication refill- history of hypertension and DM

## 2012-05-14 ENCOUNTER — Emergency Department (HOSPITAL_COMMUNITY)
Admission: EM | Admit: 2012-05-14 | Discharge: 2012-05-14 | Disposition: A | Discharge status: Home / Self Care | Payer: No Typology Code available for payment source | Source: Home / Self Care

## 2012-05-14 ENCOUNTER — Encounter (HOSPITAL_COMMUNITY): Payer: Self-pay

## 2012-05-14 NOTE — ED Notes (Signed)
Here for blood pressure check only

## 2013-03-09 ENCOUNTER — Other Ambulatory Visit: Payer: Self-pay | Admitting: Internal Medicine

## 2013-10-16 ENCOUNTER — Encounter: Payer: Self-pay | Admitting: Cardiology

## 2013-10-16 DIAGNOSIS — R609 Edema, unspecified: Secondary | ICD-10-CM | POA: Insufficient documentation

## 2013-10-16 DIAGNOSIS — I10 Essential (primary) hypertension: Secondary | ICD-10-CM | POA: Insufficient documentation

## 2013-10-23 ENCOUNTER — Ambulatory Visit: Payer: No Typology Code available for payment source | Admitting: *Deleted

## 2013-11-03 IMAGING — CT CT HIP*R* W/O CM
1 series · 15 of 32 positions shown, 19 images · non-contrast
Comparison: Radiographs same date.

CLINICAL DATA: Right hip pain with limited weightbearing.  No known
injury.

CT OF THE RIGHT HIP WITHOUT CONTRAST
TECHNIQUE: Multidetector CT imaging was performed according to the
standard protocol. Multiplanar CT image reconstructions were also
generated.

[Series 3: hip st · axial · 0.46mm/px · z∈[+887,+1077]mm · 15 of 43 slices shown, 19 images]
[im 3/43  soft-tissue]
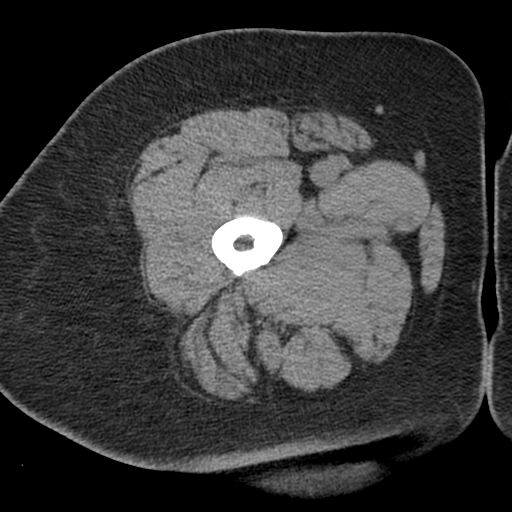
[im 3/43  bone]
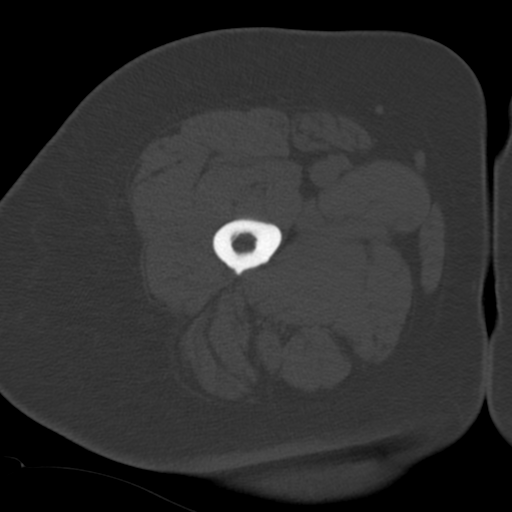
[im 6/43  soft-tissue]
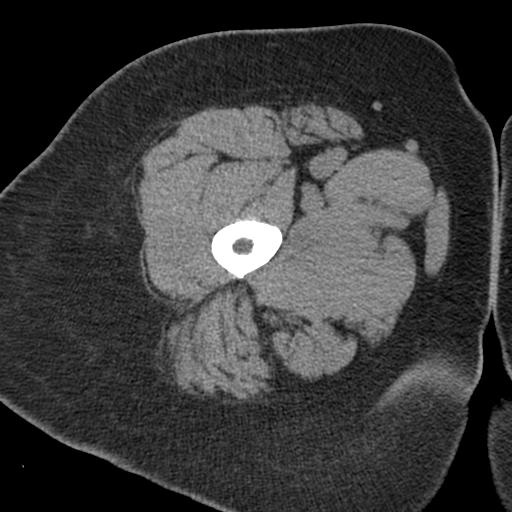
[im 9/43  soft-tissue]
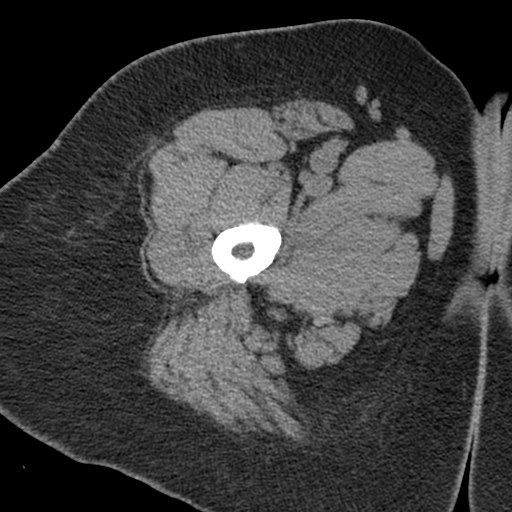
[im 13/43  soft-tissue]
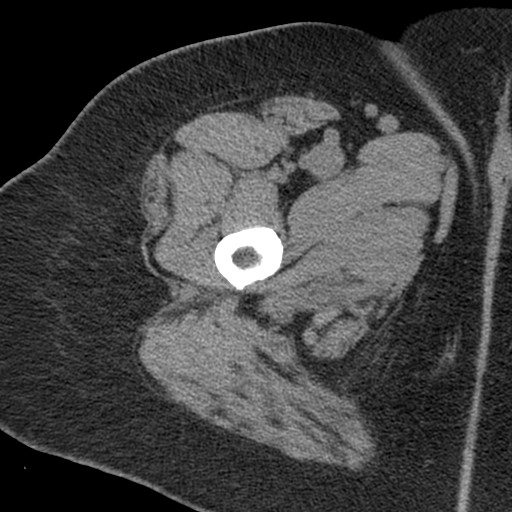
[im 15/43  soft-tissue]
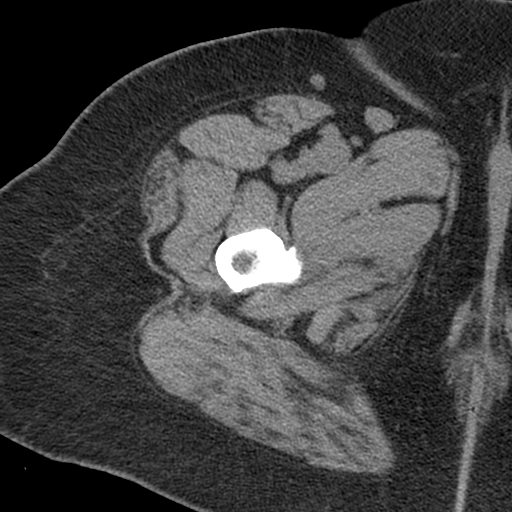
[im 18/43  soft-tissue]
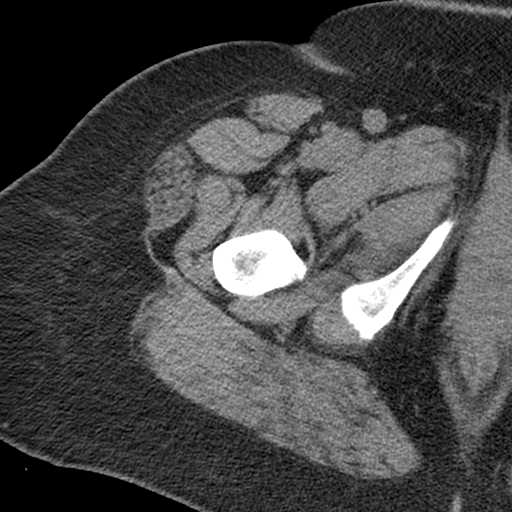
[im 22/43  soft-tissue]
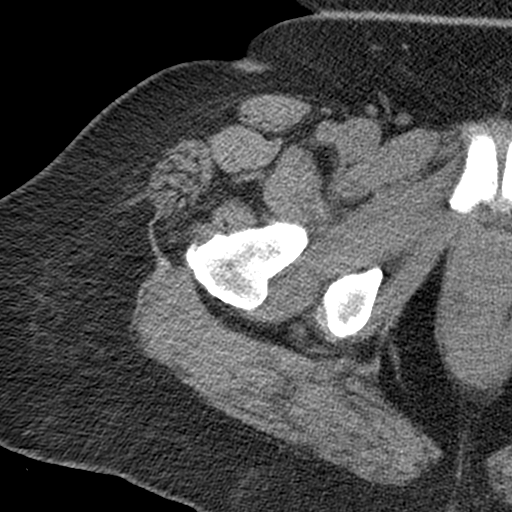
[im 25/43  soft-tissue]
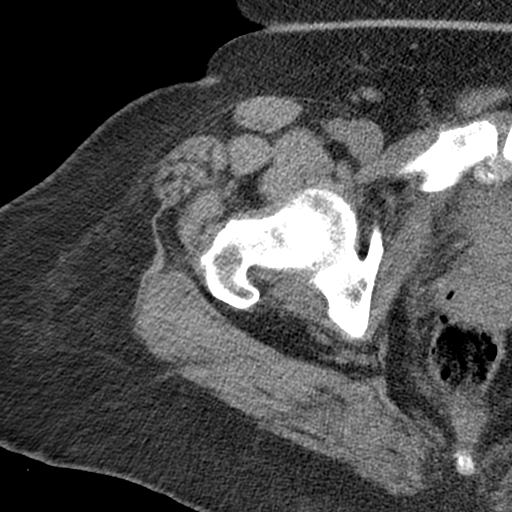
[im 28/43  soft-tissue]
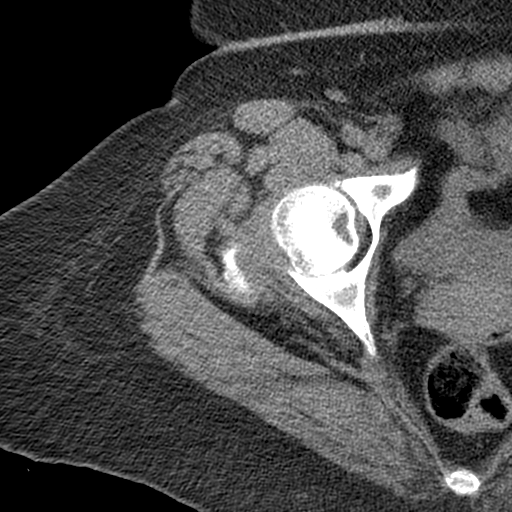
[im 28/43  bone]
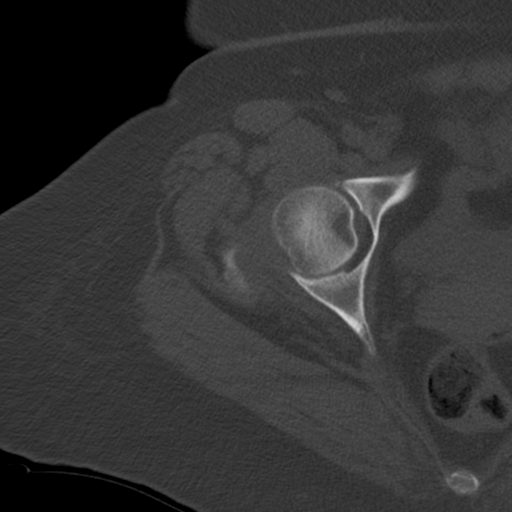
[im 30/43  soft-tissue]
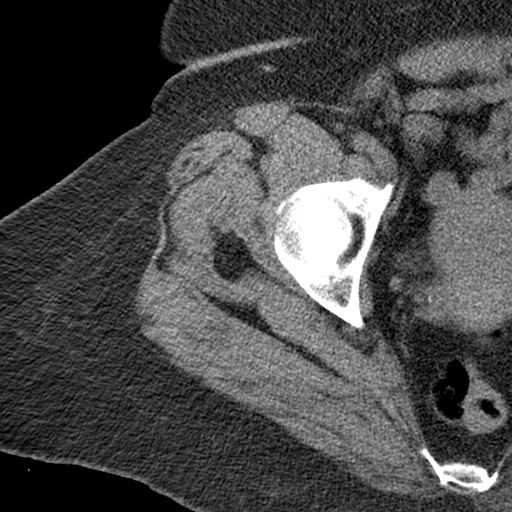
[im 34/43  soft-tissue]
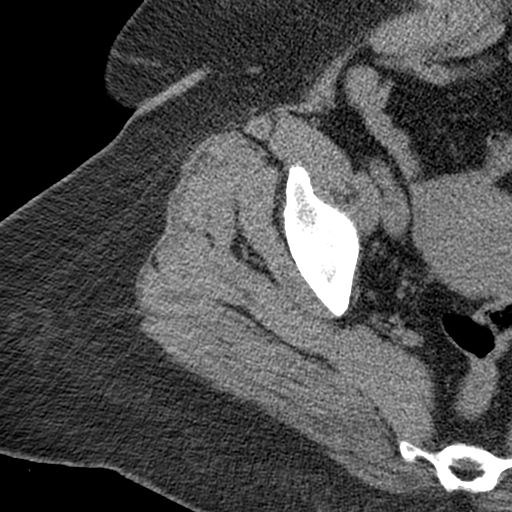
[im 37/43  soft-tissue]
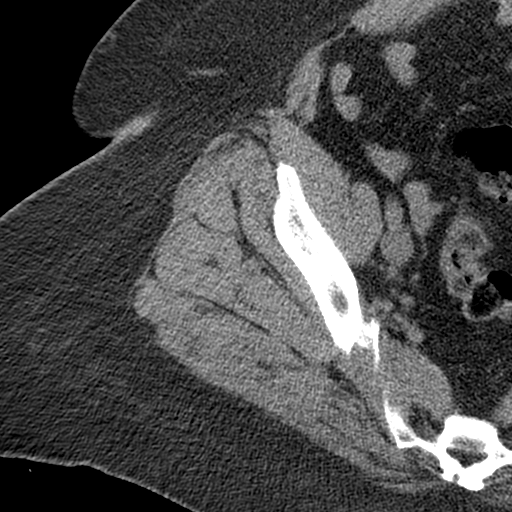
[im 37/43  lung]
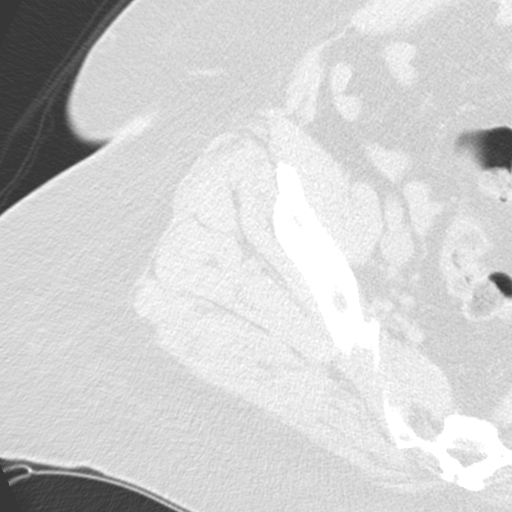
[im 38/43  lung]
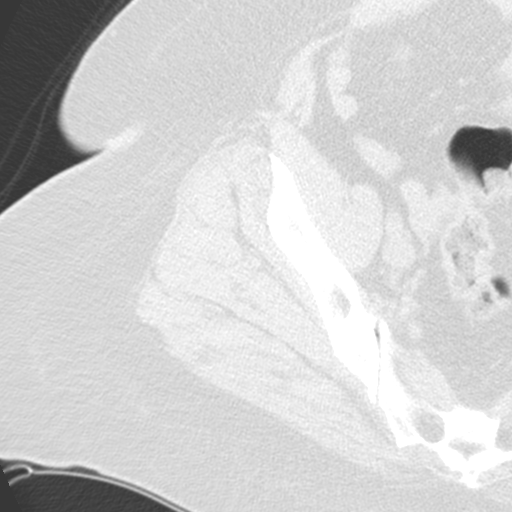
[im 40/43  soft-tissue]
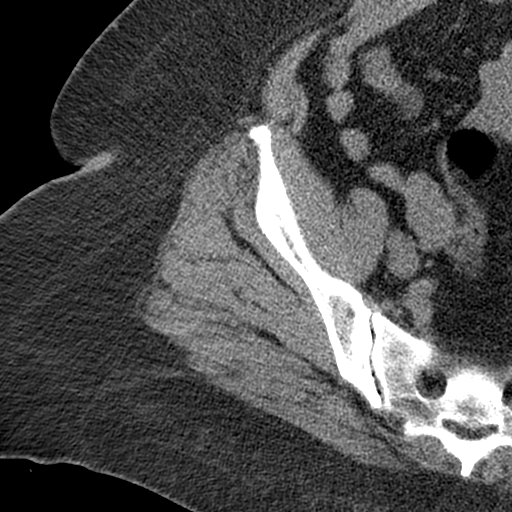
[im 40/43  lung]
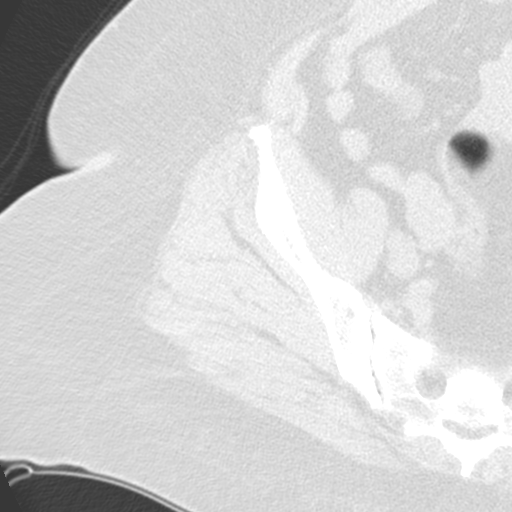
[im 41/43  lung]
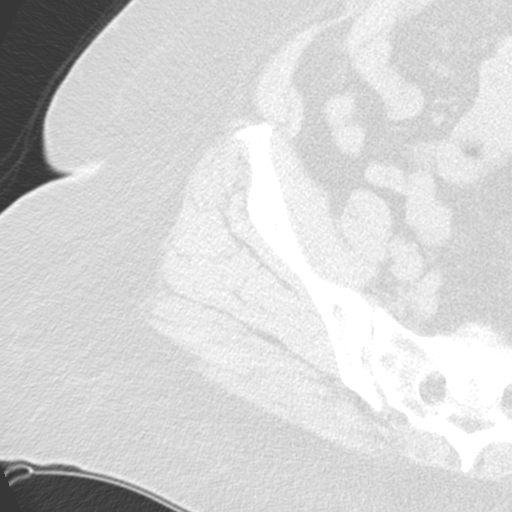

[15 of 32 positions shown; findings below may reference images not displayed]

FINDINGS: Study is limited to the right hip and inferior right hemi
pelvis.  The mineralization and alignment are normal.  There is no
evidence of acute fracture or dislocation.  There is no evidence of
femoral head osteonecrosis.  The hip joint spaces appear adequately
maintained.  There are enthesophytes at the greater and lesser
trochanters.

Degenerative changes at the symphysis pubis and right sacroiliac
joint are partially imaged.

No significant hip joint effusion or periarticular soft tissue
abnormality is identified.
IMPRESSION: 1.  No acute right hip osseous findings.
2.  Mild degenerative changes at the symphysis pubis and sacroiliac
joints, incompletely imaged.

Further evaluation with MRI should be considered if the patient has
persistent unexplained pain, preferred modality in the atraumatic
setting.

## 2013-12-04 ENCOUNTER — Ambulatory Visit: Payer: No Typology Code available for payment source | Admitting: *Deleted

## 2014-01-02 ENCOUNTER — Ambulatory Visit: Payer: No Typology Code available for payment source | Admitting: Dietician

## 2015-07-12 ENCOUNTER — Encounter (HOSPITAL_COMMUNITY): Payer: Self-pay | Admitting: Adult Health

## 2015-07-12 ENCOUNTER — Emergency Department (HOSPITAL_COMMUNITY)
Admission: EM | Admit: 2015-07-12 | Discharge: 2015-07-12 | Disposition: A | Discharge status: Home / Self Care | Payer: BLUE CROSS/BLUE SHIELD | Attending: Physician Assistant | Admitting: Physician Assistant

## 2015-07-12 DIAGNOSIS — E119 Type 2 diabetes mellitus without complications: Secondary | ICD-10-CM | POA: Insufficient documentation

## 2015-07-12 DIAGNOSIS — Z7984 Long term (current) use of oral hypoglycemic drugs: Secondary | ICD-10-CM | POA: Diagnosis not present

## 2015-07-12 DIAGNOSIS — I10 Essential (primary) hypertension: Secondary | ICD-10-CM | POA: Insufficient documentation

## 2015-07-12 DIAGNOSIS — Z794 Long term (current) use of insulin: Secondary | ICD-10-CM | POA: Insufficient documentation

## 2015-07-12 DIAGNOSIS — Z79899 Other long term (current) drug therapy: Secondary | ICD-10-CM | POA: Diagnosis not present

## 2015-07-12 DIAGNOSIS — R197 Diarrhea, unspecified: Secondary | ICD-10-CM | POA: Insufficient documentation

## 2015-07-12 DIAGNOSIS — E669 Obesity, unspecified: Secondary | ICD-10-CM | POA: Diagnosis not present

## 2015-07-12 LAB — CBC
HEMATOCRIT: 38.7 % (ref 36.0–46.0)
HEMOGLOBIN: 12.7 g/dL (ref 12.0–15.0)
MCH: 26.8 pg (ref 26.0–34.0)
MCHC: 32.8 g/dL (ref 30.0–36.0)
MCV: 81.6 fL (ref 78.0–100.0)
Platelets: 182 10*3/uL (ref 150–400)
RBC: 4.74 MIL/uL (ref 3.87–5.11)
RDW: 12.9 % (ref 11.5–15.5)
WBC: 6.3 10*3/uL (ref 4.0–10.5)

## 2015-07-12 LAB — COMPREHENSIVE METABOLIC PANEL
ALBUMIN: 2.9 g/dL — AB (ref 3.5–5.0)
ALT: 14 U/L (ref 14–54)
ANION GAP: 12 (ref 5–15)
AST: 15 U/L (ref 15–41)
Alkaline Phosphatase: 64 U/L (ref 38–126)
BILIRUBIN TOTAL: 0.4 mg/dL (ref 0.3–1.2)
BUN: 8 mg/dL (ref 6–20)
CO2: 25 mmol/L (ref 22–32)
Calcium: 9.3 mg/dL (ref 8.9–10.3)
Chloride: 96 mmol/L — ABNORMAL LOW (ref 101–111)
Creatinine, Ser: 0.7 mg/dL (ref 0.44–1.00)
GFR calc non Af Amer: >60 mL/min (ref >60)
GLUCOSE: 307 mg/dL — AB (ref 65–99)
POTASSIUM: 3.6 mmol/L (ref 3.5–5.1)
Sodium: 133 mmol/L — ABNORMAL LOW (ref 135–145)
TOTAL PROTEIN: 7.5 g/dL (ref 6.5–8.1)

## 2015-07-12 LAB — URINALYSIS, ROUTINE W REFLEX MICROSCOPIC
Bilirubin Urine: NEGATIVE
Glucose, UA: >1000 mg/dL — AB
Hgb urine dipstick: NEGATIVE
Ketones, ur: 15 mg/dL — AB
LEUKOCYTES UA: NEGATIVE
NITRITE: NEGATIVE
PH: 5.5 (ref 5.0–8.0)
Protein, ur: 30 mg/dL — AB
SPECIFIC GRAVITY, URINE: 1.034 — AB (ref 1.005–1.030)

## 2015-07-12 LAB — URINE MICROSCOPIC-ADD ON
Bacteria, UA: NONE SEEN
RBC / HPF: NONE SEEN RBC/hpf (ref 0–5)
WBC UA: NONE SEEN WBC/hpf (ref 0–5)

## 2015-07-12 LAB — LIPASE, BLOOD: Lipase: 19 U/L (ref 11–51)

## 2015-07-12 MED ORDER — LOPERAMIDE HCL 2 MG PO CAPS: 2.0000 mg | ORAL_CAPSULE | Freq: Two times a day (BID) | ORAL | Status: DC | PRN

## 2015-07-12 MED ORDER — ONDANSETRON HCL 4 MG PO TABS: 4.0000 mg | ORAL_TABLET | Freq: Three times a day (TID) | ORAL | Status: DC | PRN

## 2015-07-12 MED ORDER — ONDANSETRON HCL 4 MG PO TABS
4.0000 mg | ORAL_TABLET | Freq: Once | ORAL | Status: AC
  Administered 2015-07-12: 4 mg via ORAL
  Filled 2015-07-12: qty 1

## 2015-07-12 NOTE — ED Notes (Signed)
Pt requesting to leave prior to giving a stool sample.  Stable.  Denies pain.

## 2015-07-12 NOTE — ED Provider Notes (Signed)
CSN: [id number]     Arrival date & time 07/12/15  1046 History   First MD Initiated Contact with Patient 07/12/15 1317     Chief Complaint  Patient presents with  . Diarrhea     (Consider location/radiation/quality/duration/timing/severity/associated sxs/prior Treatment) HPI  Patient is 65 year old female with history of diabetes hypertension and obesity. She is presenting for a work note for work. Patient had diarrhea for the last week. Patient report that when she eats she has diarrhea. Patient works in a daycare. Concern for noravirus. Patient has no vomiting. No nausea. Patient has no abdominal pain. No fevers noted and has been able take normal by mouth.  Patient has no blood in her stool.  Past Medical History  Diagnosis Date  . Diabetes mellitus   . Hypertension   . Essential hypertension, benign   . Obesity, unspecified   . Edema    Past Surgical History  Procedure Laterality Date  . Cesarean section     History reviewed. No pertinent family history. Social History  Substance Use Topics  . Smoking status: Never Smoker   . Smokeless tobacco: None  . Alcohol Use: No   OB History    No data available     Review of Systems  Constitutional: Negative for fever and fatigue.  HENT: Negative for congestion.   Eyes: Negative for discharge.  Respiratory: Negative for cough.   Cardiovascular: Negative for chest pain.  Gastrointestinal: Positive for diarrhea. Negative for nausea, vomiting, constipation and abdominal distention.  Genitourinary: Negative for dysuria.  Musculoskeletal: Negative for joint swelling.  Skin: Negative for rash.  Psychiatric/Behavioral: Negative for agitation.      Allergies  Hydrochlorothiazide  Home Medications   Prior to Admission medications   Medication Sig Start Date End Date Taking? Authorizing Provider  aspirin EC 81 MG tablet Take 1 tablet (81 mg total) by mouth daily. 04/16/12   Shanker Levora Dredge, MD  benazepril (LOTENSIN) 40  MG tablet Take 1 tablet (40 mg total) by mouth daily. 04/16/12   Shanker Levora Dredge, MD  furosemide (LASIX) 40 MG tablet Take 1 tablet (40 mg total) by mouth daily. 04/16/12   Shanker Levora Dredge, MD  insulin glargine (LANTUS) 100 UNIT/ML injection Inject 30 Units into the skin every morning. 25 units in the pm    Historical Provider, MD  loperamide (IMODIUM) 2 MG capsule Take 1 capsule (2 mg total) by mouth 2 (two) times daily as needed for diarrhea or loose stools. 07/12/15   Melecio Cueto Lyn Lateya Dauria, MD  metFORMIN (GLUCOPHAGE) 1000 MG tablet Take 1 tablet (1,000 mg total) by mouth 2 (two) times daily with a meal. 04/16/12   Shanker Levora Dredge, MD  metoprolol (LOPRESSOR) 50 MG tablet Take 1 tablet (50 mg total) by mouth 2 (two) times daily. 04/16/12   Shanker Levora Dredge, MD  ondansetron (ZOFRAN) 4 MG tablet Take 1 tablet (4 mg total) by mouth every 8 (eight) hours as needed for nausea or vomiting. 07/12/15   Eaden Hettinger Lyn Shuan Statzer, MD  terazosin (HYTRIN) 1 MG capsule Take 2 capsules (2 mg total) by mouth at bedtime. 04/16/12   Shanker Levora Dredge, MD   BP 130/76 mmHg  Pulse 68  Temp(Src) 98.1 F (36.7 C) (Oral)  Resp 18  Wt 236 lb (107.049 kg)  SpO2 99% Physical Exam  Constitutional: She is oriented to person, place, and time. She appears well-developed and well-nourished.  HENT:  Head: Normocephalic and atraumatic.  Moist mucous membranes.  Eyes: Conjunctivae are normal. Right  eye exhibits no discharge.  Neck: Neck supple.  Cardiovascular: Normal rate, regular rhythm and normal heart sounds.   No murmur heard. Pulmonary/Chest: Effort normal and breath sounds normal. She has no wheezes. She has no rales.  Abdominal: Soft. She exhibits no distension. There is no tenderness.  No abdominal tenderness.  Musculoskeletal: Normal range of motion. She exhibits no edema.  Neurological: She is oriented to person, place, and time. No cranial nerve deficit.  Skin: Skin is warm and dry. No rash noted. She is  not diaphoretic.  Psychiatric: She has a normal mood and affect. Her behavior is normal.  Nursing note and vitals reviewed.   ED Course  Procedures (including critical care time) Labs Review Labs Reviewed  COMPREHENSIVE METABOLIC PANEL - Abnormal; Notable for the following:    Sodium 133 (*)    Chloride 96 (*)    Glucose, Bld 307 (*)    Albumin 2.9 (*)    All other components within normal limits  URINALYSIS, ROUTINE W REFLEX MICROSCOPIC (NOT AT [facility]) - Abnormal; Notable for the following:    Specific Gravity, Urine 1.034 (*)    Glucose, UA >1000 (*)    Ketones, ur 15 (*)    Protein, ur 30 (*)    All other components within normal limits  URINE MICROSCOPIC-ADD ON - Abnormal; Notable for the following:    Squamous Epithelial / LPF 0-5 (*)    All other components within normal limits  GASTROINTESTINAL PANEL BY PCR, STOOL (REPLACES STOOL CULTURE)  LIPASE, BLOOD  CBC    Imaging Review No results found. I have personally reviewed and evaluated these images and lab results as part of my medical decision-making.   EKG Interpretation None      MDM   Final diagnoses:  Diarrhea, unspecified type    Patient is a very pleasant obese 65 year old female with history of diabetes presenting with diarrhea for the last 4-5 days. Patient reports that she works in a daycare. 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 She's had no nausea no vomiting. No blood in her stools. Patient's been able take by mouth adequately. Patient has not had any diarrhea since arrival here, 3 hours.  We will give Zofran, have patient eat. Will have patient try to collect stool here and sent for cultures and C. Difficile.  I suspect that she likely has norovirus given that she works in a daycare setting. We will give her many days off work and have her follow-up with the primary care physicianand give her cup for stool to collect and deliver to primary care physician to  better elucidate whether there is any needed antibiotics treatment for this diarrhea.   Patient's duaghter demanding to leave before adequete PO challenge or collection of stool. I think this is safe given normal vitals and plans to follow up with PCP.     Ramy Greth Randall AnLyn Alisia Vanengen, MD 07/14/15 1510

## 2015-07-12 NOTE — Discharge Instructions (Signed)
We wanted you to stay to collect your stool and send it. However you would like to go.. Please follow-up with your primary care physician. Please come back with any blood in your stools, or increase in stooling. Or feelings of dehydration. Please bring stool to your primary to have it tested.

## 2015-07-12 NOTE — ED Notes (Signed)
Presents with diarrhea since Tuesday and nausea. denis vomiting. Diarrhea is constant and occurs right after eating. Unsure how many times going a day but "its a lot" alert and oreinted. Denies fevers. Denies blood in stools.

## 2019-05-08 ENCOUNTER — Ambulatory Visit: Payer: Medicare Other | Attending: Internal Medicine

## 2019-05-08 ENCOUNTER — Ambulatory Visit: Payer: Self-pay | Attending: Internal Medicine

## 2019-05-08 DIAGNOSIS — Z20822 Contact with and (suspected) exposure to covid-19: Secondary | ICD-10-CM

## 2019-05-08 DIAGNOSIS — U071 COVID-19: Secondary | ICD-10-CM | POA: Insufficient documentation

## 2019-05-09 LAB — NOVEL CORONAVIRUS, NAA: SARS-CoV-2, NAA: DETECTED — AB

## 2019-05-10 ENCOUNTER — Telehealth: Payer: Self-pay | Admitting: Nurse Practitioner

## 2019-05-10 NOTE — Telephone Encounter (Signed)
Called to Discuss with patient about Covid symptoms and the use of bamlanivimab, a monoclonal antibody infusion for those with mild to moderate Covid symptoms and at a high risk of hospitalization.     Pt is qualified for this infusion at the [facility] infusion center due to co-morbid conditions and/or a member of an at-risk group.     Patient states that she is not having any symptoms at this time. Symptoms tier reviewed as well as criteria for ending isolation. Preventative practices reviewed. Patient verbalized understanding.    Patient advised to call back if she does develop symptoms and decides that she does want to get infusion. Callback number to the infusion center given. Patient advised to go to Urgent care or ED with severe symptoms.

## 2019-05-23 ENCOUNTER — Telehealth: Payer: Self-pay | Admitting: *Deleted

## 2019-05-23 NOTE — Telephone Encounter (Signed)
Phone call to pt.  Discussed that a letter can be mailed to her home that indicates date of positive COVID test and the quarantine guidelines.  (Pt. stated she does not have an email, internet access, or a cell phone.)  Advised the letter will be mailed tomorrow.  Also advised the letter will strictly outline the quarantine guidelines and will not actually give authorization to return to work.  Advised as long as she has met the criteria to come out of quarantine, she should be able to resume her normal activity.  Pt. Denied having any symptoms.  Stated she has quarantined since 05/08/19.  Pt. Agreed with plan to mail letter to her home.

## 2019-05-23 NOTE — Telephone Encounter (Signed)
Patient called and is requesting a letter that will allow her to go back to work, after testing positive  . The patient states she spoke with someone on 05/19/19 that agreed to mail her this information ,please call at [phone]

## 2019-05-24 NOTE — Telephone Encounter (Signed)
Per pt. request, mailed letter containing positive test date with quarantine guidelines, and COVID test result to her home address.

## 2019-06-24 ENCOUNTER — Ambulatory Visit: Payer: Medicare Other | Attending: Internal Medicine

## 2019-12-19 ENCOUNTER — Encounter (INDEPENDENT_AMBULATORY_CARE_PROVIDER_SITE_OTHER): Payer: Medicare Other | Admitting: Ophthalmology

## 2020-01-08 ENCOUNTER — Encounter (INDEPENDENT_AMBULATORY_CARE_PROVIDER_SITE_OTHER): Payer: Self-pay | Admitting: Ophthalmology

## 2020-01-08 ENCOUNTER — Ambulatory Visit (INDEPENDENT_AMBULATORY_CARE_PROVIDER_SITE_OTHER): Payer: Medicare Other | Admitting: Ophthalmology

## 2020-01-08 ENCOUNTER — Other Ambulatory Visit: Payer: Self-pay

## 2020-01-08 DIAGNOSIS — H401132 Primary open-angle glaucoma, bilateral, moderate stage: Secondary | ICD-10-CM

## 2020-01-08 DIAGNOSIS — E113511 Type 2 diabetes mellitus with proliferative diabetic retinopathy with macular edema, right eye: Secondary | ICD-10-CM

## 2020-01-08 DIAGNOSIS — Z794 Long term (current) use of insulin: Secondary | ICD-10-CM

## 2020-01-08 DIAGNOSIS — E083512 Diabetes mellitus due to underlying condition with proliferative diabetic retinopathy with macular edema, left eye: Secondary | ICD-10-CM

## 2020-01-08 DIAGNOSIS — E113512 Type 2 diabetes mellitus with proliferative diabetic retinopathy with macular edema, left eye: Secondary | ICD-10-CM | POA: Diagnosis not present

## 2020-01-08 DIAGNOSIS — E113391 Type 2 diabetes mellitus with moderate nonproliferative diabetic retinopathy without macular edema, right eye: Secondary | ICD-10-CM

## 2020-01-08 DIAGNOSIS — E083591 Diabetes mellitus due to underlying condition with proliferative diabetic retinopathy without macular edema, right eye: Secondary | ICD-10-CM

## 2020-01-08 DIAGNOSIS — E113312 Type 2 diabetes mellitus with moderate nonproliferative diabetic retinopathy with macular edema, left eye: Secondary | ICD-10-CM | POA: Insufficient documentation

## 2020-01-08 MED ORDER — BEVACIZUMAB CHEMO INJECTION 1.25MG/0.05ML SYRINGE FOR KALEIDOSCOPE
1.2500 mg | INTRAVITREAL | Status: AC | PRN
  Administered 2020-01-08: 1.25 mg via INTRAVITREAL

## 2020-01-08 NOTE — Assessment & Plan Note (Signed)
Need to schedule intravitreal Avastin OD for CSME noncenter involved

## 2020-01-08 NOTE — Assessment & Plan Note (Signed)
Need intravitreal Avastin to halt CSME and treat underlying neovascularization of the optic nerve.   Reports poor blood sugar control in recent months.  And "do I need to restart my insulin" per her primary care physician in order to answer this important question

## 2020-01-08 NOTE — Progress Notes (Signed)
01/08/2020     CHIEF COMPLAINT Patient presents for Retina Follow Up   HISTORY OF PRESENT ILLNESS: Ann Gomez is a 69 y.o. female who presents to the clinic today for:   HPI    Retina Follow Up    Patient presents with  Diabetic Retinopathy.  In both eyes.  Severity is moderate.  Duration of 2 years.  Since onset it is stable.  I, the attending physician,  performed the HPI with the patient and updated documentation appropriately.          Comments    2 Year NPDR f\u OU. OCT  Pt states no changes in vision. Pt using gtts prescribed by Dr. Dione Booze. Denies any complaints.       Last edited by Elyse Jarvis on 01/08/2020  9:25 AM. (History)      Referring physician: No referring provider defined for this encounter.  HISTORICAL INFORMATION:   Selected notes from the MEDICAL RECORD NUMBER    Lab Results  Component Value Date   HGBA1C 9.6 (H) 06/08/2009     CURRENT MEDICATIONS: Current Outpatient Medications (Ophthalmic Drugs)  Medication Sig  . dorzolamide-timolol (COSOPT) 22.3-6.8 MG/ML ophthalmic solution 1 drop 2 (two) times daily.  Marland Kitchen latanoprost (XALATAN) 0.005 % ophthalmic solution 1 drop daily.   No current facility-administered medications for this visit. (Ophthalmic Drugs)   Current Outpatient Medications (Other)  Medication Sig  . amLODipine (NORVASC) 10 MG tablet Take 10 mg by mouth daily.  Marland Kitchen aspirin EC 81 MG tablet Take 1 tablet (81 mg total) by mouth daily.  . benazepril (LOTENSIN) 40 MG tablet Take 1 tablet (40 mg total) by mouth daily.  . furosemide (LASIX) 40 MG tablet Take 1 tablet (40 mg total) by mouth daily.  . insulin glargine (LANTUS) 100 UNIT/ML injection Inject 30 Units into the skin every morning. 25 units in the pm  . loperamide (IMODIUM) 2 MG capsule Take 1 capsule (2 mg total) by mouth 2 (two) times daily as needed for diarrhea or loose stools.  Marland Kitchen losartan (COZAAR) 100 MG tablet Take 100 mg by mouth daily.  . metFORMIN (GLUCOPHAGE)  1000 MG tablet Take 1 tablet (1,000 mg total) by mouth 2 (two) times daily with a meal.  . metoprolol (LOPRESSOR) 50 MG tablet Take 1 tablet (50 mg total) by mouth 2 (two) times daily.  . ondansetron (ZOFRAN) 4 MG tablet Take 1 tablet (4 mg total) by mouth every 8 (eight) hours as needed for nausea or vomiting.  . pravastatin (PRAVACHOL) 20 MG tablet Take 20 mg by mouth at bedtime.  Marland Kitchen terazosin (HYTRIN) 1 MG capsule Take 2 capsules (2 mg total) by mouth at bedtime.   No current facility-administered medications for this visit. (Other)      REVIEW OF SYSTEMS:    ALLERGIES Allergies  Allergen Reactions  . Hydrochlorothiazide Other (See Comments)    dizzy    PAST MEDICAL HISTORY Past Medical History:  Diagnosis Date  . Diabetes mellitus   . Edema   . Essential hypertension, benign   . Hypertension   . Obesity, unspecified    Past Surgical History:  Procedure Laterality Date  . CESAREAN SECTION      FAMILY HISTORY History reviewed. No pertinent family history.  SOCIAL HISTORY Social History   Tobacco Use  . Smoking status: Never Smoker  Substance Use Topics  . Alcohol use: No  . Drug use: No         OPHTHALMIC EXAM:  Base Eye Exam    Visual Acuity (Snellen - Linear)      Right Left   Dist [place] 20/20 -2 20/40 -2   Dist ph [place]  NI       Tonometry (Tonopen, 9:30 AM)      Right Left   Pressure 28 21       Pupils      Pupils Dark Light Shape React APD   Right PERRL 3 2 Round Slow None   Left PERRL 3 2 Round Slow None       Visual Fields (Counting fingers)      Left Right    Full Full       Neuro/Psych    Mood/Affect: Normal       Dilation    Left eye: 1.0% Mydriacyl, 2.5% Phenylephrine @ 9:30 AM        Slit Lamp and Fundus Exam    External Exam      Right Left   External Normal Normal       Slit Lamp Exam      Right Left   Lids/Lashes Normal Normal   Conjunctiva/Sclera White and quiet White and quiet   Cornea Clear Clear   Anterior  Chamber Deep and quiet Deep and quiet   Iris Round and reactive Round and reactive   Anterior Vitreous Normal Normal       Fundus Exam      Right Left   Posterior Vitreous Normal Normal   Disc Neovascularization Neovascularization   C/D Ratio 0.45 0.45   Macula Microaneurysms, Mild clinically significant macular edema Microaneurysms, Mild clinically significant macular edema   Vessels PDR-active PDR-active   Periphery Dot-blot hemorrhage Dot-blot hemorrhage          IMAGING AND PROCEDURES  Imaging and Procedures for 01/08/20  OCT, Retina - OU - Both Eyes       Right Eye Quality was good. Scan locations included subfoveal. Central Foveal Thickness: 245. Progression has worsened.   Left Eye Central Foveal Thickness: 384. Progression has worsened.   Notes Massive new CSME  superior to the fovea OD,  OS, with epiretinal membrane and CSME Retinal thickening       Intravitreal Injection, Pharmacologic Agent - OS - Left Eye       Time Out 01/08/2020. 11:26 AM. Confirmed correct patient, procedure, site, and patient consented.   Anesthesia Topical anesthesia was used. Anesthetic medications included Akten 3.5%.   Procedure Preparation included Tobramycin 0.3%, 10% betadine to eyelids, 5% betadine to ocular surface. A supplied needle was used.   Injection:  1.25 mg Bevacizumab (AVASTIN) SOLN   NDC: [address]-4, Lot: [id number]   Route: Intravitreal, Site: Left Eye, Waste: 0 mg  Post-op Post injection exam found visual acuity of at least counting fingers. The patient tolerated the procedure well. There were no complications. The patient received written and verbal post procedure care education. Post injection medications were not given.                 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   Reports poor blood sugar control in recent months.  And "do I need to restart my insulin" per her primary care physician in order to answer this important question  Proliferative diabetic retinopathy of right eye (HCC) Need to schedule intravitreal Avastin OD for CSME  noncenter involved      ICD-10-CM   1. 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 Primary open angle glaucoma of both eyes, moderate stage  H40.1132   5. Proliferative diabetic retinopathy of right eye with macular edema associated with type 2 diabetes mellitus (HCC)  E11.3511     1.  Benefits reviewed with the patient including profound vision loss without therapy for neovascularization of the optic nerve in each eye which is high risk characteristics for PDR.  Moreover the active CSME can become permanent without therapy.  Discussed at length the nature of injection of intravitreal antivegF medications to halt this progression.  She understands that these injections may be required every 5 to 6 weeks until the disease is under control in both eyes.  She also understands that laser photocoagulation may be delivered later on to more permanently halt the disease and also hopefully decrease the number of injections in the future.  2.  3.  Ophthalmic Meds Ordered this visit:  Meds ordered this encounter    Medications  . Bevacizumab (AVASTIN) SOLN 1.25 mg       Return in about 1 week (around 01/15/2020), or Injection intravitreal Avastin OS operative today, for AVASTIN OCT, OD.  There are no Patient Instructions on file for this visit.   Explained the diagnoses, plan, and follow up with the patient and they expressed understanding.  Patient expressed understanding of the importance of proper follow up care.   Alford Highland Demetrius Barrell M.D. Diseases & Surgery of the Retina and Vitreous [facility] 01/08/20     Abbreviations: M myopia (nearsighted); A astigmatism; H hyperopia (farsighted); P presbyopia; Mrx spectacle prescription;  CTL contact lenses; OD right eye; OS left eye; OU both eyes  XT exotropia; ET esotropia; PEK punctate epithelial keratitis; PEE punctate epithelial erosions; DES dry eye syndrome; MGD meibomian gland dysfunction; ATs artificial tears; PFAT's preservative free artificial tears; NSC nuclear sclerotic cataract; PSC posterior subcapsular cataract; ERM epi-retinal membrane; PVD posterior vitreous detachment; RD retinal detachment; DM diabetes mellitus; DR diabetic retinopathy; NPDR non-proliferative diabetic retinopathy; PDR proliferative diabetic retinopathy; CSME clinically significant macular edema; DME diabetic macular edema; dbh dot blot hemorrhages; CWS cotton wool spot; POAG primary open angle glaucoma; C/D cup-to-disc ratio; HVF humphrey visual field; GVF goldmann visual field; OCT optical coherence tomography; IOP intraocular pressure; BRVO Branch retinal vein occlusion; CRVO central retinal vein occlusion; CRAO central retinal artery occlusion; BRAO branch retinal artery occlusion; RT retinal tear; SB scleral buckle; PPV pars plana vitrectomy; VH Vitreous hemorrhage; PRP panretinal laser photocoagulation; IVK intravitreal kenalog; VMT vitreomacular traction; MH Macular hole;  NVD neovascularization of the disc; NVE neovascularization elsewhere; AREDS age  related eye disease study; ARMD age related macular degeneration; POAG primary open angle glaucoma; EBMD epithelial/anterior basement membrane dystrophy; ACIOL anterior chamber intraocular lens; IOL intraocular lens; PCIOL posterior chamber intraocular lens; Phaco/IOL phacoemulsification with  intraocular lens placement; [place] photorefractive keratectomy; LASIK laser assisted in situ keratomileusis; HTN hypertension; DM diabetes mellitus; COPD chronic obstructive pulmonary disease

## 2020-01-14 ENCOUNTER — Ambulatory Visit (INDEPENDENT_AMBULATORY_CARE_PROVIDER_SITE_OTHER): Payer: Medicare Other | Admitting: Ophthalmology

## 2020-01-14 ENCOUNTER — Other Ambulatory Visit: Payer: Self-pay

## 2020-01-14 ENCOUNTER — Encounter (INDEPENDENT_AMBULATORY_CARE_PROVIDER_SITE_OTHER): Payer: Self-pay | Admitting: Ophthalmology

## 2020-01-14 DIAGNOSIS — E113511 Type 2 diabetes mellitus with proliferative diabetic retinopathy with macular edema, right eye: Secondary | ICD-10-CM | POA: Diagnosis not present

## 2020-01-14 DIAGNOSIS — E113391 Type 2 diabetes mellitus with moderate nonproliferative diabetic retinopathy without macular edema, right eye: Secondary | ICD-10-CM

## 2020-01-14 DIAGNOSIS — E113512 Type 2 diabetes mellitus with proliferative diabetic retinopathy with macular edema, left eye: Secondary | ICD-10-CM

## 2020-01-14 DIAGNOSIS — E113312 Type 2 diabetes mellitus with moderate nonproliferative diabetic retinopathy with macular edema, left eye: Secondary | ICD-10-CM

## 2020-01-14 DIAGNOSIS — Z794 Long term (current) use of insulin: Secondary | ICD-10-CM | POA: Diagnosis not present

## 2020-01-14 MED ORDER — BEVACIZUMAB CHEMO INJECTION 1.25MG/0.05ML SYRINGE FOR KALEIDOSCOPE
1.2500 mg | INTRAVITREAL | Status: AC | PRN
  Administered 2020-01-14: 1.25 mg via INTRAVITREAL

## 2020-01-14 NOTE — Progress Notes (Signed)
01/14/2020     CHIEF COMPLAINT Patient presents for Retina Follow Up   HISTORY OF PRESENT ILLNESS: Ann Gomez is a 69 y.o. female who presents to the clinic today for:   HPI    Retina Follow Up    Patient presents with  Diabetic Retinopathy.  In right eye.  Severity is moderate.  Duration of 1 week.  Since onset it is stable.  I, the attending physician,  performed the HPI with the patient and updated documentation appropriately.          Comments    1 Week NPDR f\u OD. Avastin OD. OCT  Pt feels vision has improved since last week. Denies any complaints. BGL: did not check       Last edited by Elyse Jarvis on 01/14/2020  9:07 AM. (History)      Referring physician: No referring provider defined for this encounter.  HISTORICAL INFORMATION:   Selected notes from the MEDICAL RECORD NUMBER    Lab Results  Component Value Date   HGBA1C 9.6 (H) 06/08/2009     CURRENT MEDICATIONS: Current Outpatient Medications (Ophthalmic Drugs)  Medication Sig  . dorzolamide-timolol (COSOPT) 22.3-6.8 MG/ML ophthalmic solution 1 drop 2 (two) times daily.  Marland Kitchen latanoprost (XALATAN) 0.005 % ophthalmic solution 1 drop daily.   No current facility-administered medications for this visit. (Ophthalmic Drugs)   Current Outpatient Medications (Other)  Medication Sig  . amLODipine (NORVASC) 10 MG tablet Take 10 mg by mouth daily.  Marland Kitchen aspirin EC 81 MG tablet Take 1 tablet (81 mg total) by mouth daily.  . benazepril (LOTENSIN) 40 MG tablet Take 1 tablet (40 mg total) by mouth daily.  . furosemide (LASIX) 40 MG tablet Take 1 tablet (40 mg total) by mouth daily.  . insulin glargine (LANTUS) 100 UNIT/ML injection Inject 30 Units into the skin every morning. 25 units in the pm  . loperamide (IMODIUM) 2 MG capsule Take 1 capsule (2 mg total) by mouth 2 (two) times daily as needed for diarrhea or loose stools.  Marland Kitchen losartan (COZAAR) 100 MG tablet Take 100 mg by mouth daily.  . metFORMIN  (GLUCOPHAGE) 1000 MG tablet Take 1 tablet (1,000 mg total) by mouth 2 (two) times daily with a meal.  . metoprolol (LOPRESSOR) 50 MG tablet Take 1 tablet (50 mg total) by mouth 2 (two) times daily.  . ondansetron (ZOFRAN) 4 MG tablet Take 1 tablet (4 mg total) by mouth every 8 (eight) hours as needed for nausea or vomiting.  . pravastatin (PRAVACHOL) 20 MG tablet Take 20 mg by mouth at bedtime.  Marland Kitchen terazosin (HYTRIN) 1 MG capsule Take 2 capsules (2 mg total) by mouth at bedtime.   No current facility-administered medications for this visit. (Other)      REVIEW OF SYSTEMS: ROS    Positive for: Endocrine   Last edited by Elyse Jarvis on 01/14/2020  9:07 AM. (History)       ALLERGIES Allergies  Allergen Reactions  . Hydrochlorothiazide Other (See Comments)    dizzy    PAST MEDICAL HISTORY Past Medical History:  Diagnosis Date  . Diabetes mellitus   . Edema   . Essential hypertension, benign   . Hypertension   . Obesity, unspecified    Past Surgical History:  Procedure Laterality Date  . CESAREAN SECTION      FAMILY HISTORY History reviewed. No pertinent family history.  SOCIAL HISTORY Social History   Tobacco Use  . Smoking status: Never Smoker  Substance Use Topics  . Alcohol use: No  . Drug use: No         OPHTHALMIC EXAM: Base Eye Exam    Visual Acuity (Snellen - Linear)      Right Left   Dist [place] 20/25 -2 20/40   Dist ph [place]  20/30 -1       Tonometry (Tonopen, 9:12 AM)      Right Left   Pressure 22 22       Pupils      Dark Light Shape React APD   Right 3 2 Round Sluggish None   Left 3 2 Round Slow None       Neuro/Psych    Mood/Affect: Normal       Dilation    Right eye: 1.0% Mydriacyl, 2.5% Phenylephrine @ 9:12 AM        Slit Lamp and Fundus Exam    External Exam      Right Left   External Normal Normal       Slit Lamp Exam      Right Left   Lids/Lashes Normal Normal   Conjunctiva/Sclera White and quiet White and quiet     Cornea Clear Clear   Anterior Chamber Deep and quiet Deep and quiet   Iris Round and reactive Round and reactive   Anterior Vitreous Normal Normal       Fundus Exam      Right Left   Posterior Vitreous Normal    Disc Neovascularization    C/D Ratio 0.45    Macula Microaneurysms, Mild clinically significant macular edema    Vessels PDR-active    Periphery Dot-blot hemorrhage           IMAGING AND PROCEDURES  Imaging and Procedures for 01/14/20  OCT, Retina - OU - Both Eyes       Right Eye Quality was good. Scan locations included subfoveal. Central Foveal Thickness: 265. Progression has been stable.   Left Eye Quality was good. Scan locations included subfoveal. Central Foveal Thickness: 300. Progression has improved. Findings include vitreomacular adhesion .   Notes OD, CSME superior to the fovea.  OS, much improved CSME 5 days post intravitreal Avastin.  Residual vitreal macular adhesion is noted incidentally       Intravitreal Injection, Pharmacologic Agent - OD - Right Eye       Time Out 01/14/2020. 9:57 AM. Confirmed correct patient, procedure, site, and patient consented.   Anesthesia Topical anesthesia was used. Anesthetic medications included Akten 3.5%.   Procedure Preparation included Tobramycin 0.3%, 10% betadine to eyelids, 5% betadine to ocular surface. A supplied needle was used.   Injection:  1.25 mg Bevacizumab (AVASTIN) SOLN   NDC: [address]-5, Lot: [id number]   Route: Intravitreal, Site: Right Eye, Waste: 0 mg  Post-op Post injection exam found visual acuity of at least counting fingers. The patient tolerated the procedure well. There were no complications. The patient received written and verbal post procedure care education. Post injection medications were not given.                 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 type 2 diabetes mellitus (HCC) CSME OS has  improved rather remarkably in 5 days status post intravitreal Avastin, will continue to monitor  Diabetic macular edema of right eye with proliferative retinopathy associated with type 2 diabetes mellitus (HCC) We will commence intravitreal Avastin OD today for CSME  OD PRP in the future  to calm the peripheral retinal nonperfusion      ICD-10-CM   1. Diabetic macular edema of right eye with proliferative retinopathy associated with type 2 diabetes mellitus (HCC)  Y40.3474 Intravitreal Injection, Pharmacologic Agent - OD - Right Eye    Bevacizumab (AVASTIN) SOLN 1.25 mg  2. 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 Controlled type 2 diabetes mellitus with left eye affected by moderate nonproliferative retinopathy and macular edema, with long-term current use of insulin (HCC)  Q59.5638    Z79.4   4. 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  Will commence with intravitreal Avastin OD today for active CSME with PDR  2.  OS, vastly improved 5 days status post injection intravitreal Avastin  3.  Each eye will need need PRP in the future  Ophthalmic Meds Ordered this visit:  Meds ordered this encounter  Medications  . Bevacizumab (AVASTIN) SOLN 1.25 mg       Return in about 5 weeks (around 02/18/2020) for dilate, OD, AVASTIN OCT.  There are no Patient Instructions on file for this visit.   Explained the diagnoses, plan, and follow up with the patient and they expressed understanding.  Patient expressed understanding of the importance of proper follow up care.   Alford Highland Sayf Kerner M.D. Diseases & Surgery of the Retina and Vitreous [facility] 01/14/20     Abbreviations: M myopia (nearsighted); A astigmatism; H  hyperopia (farsighted); P presbyopia; Mrx spectacle prescription;  CTL contact lenses; OD right eye; OS left eye; OU both eyes  XT exotropia; ET esotropia; PEK punctate epithelial keratitis; PEE punctate epithelial erosions; DES dry eye syndrome; MGD meibomian gland dysfunction; ATs artificial tears; PFAT's preservative free artificial tears; NSC nuclear sclerotic cataract; PSC posterior subcapsular cataract; ERM epi-retinal membrane; PVD posterior vitreous detachment; RD retinal detachment; DM diabetes mellitus; DR diabetic retinopathy; NPDR non-proliferative diabetic retinopathy; PDR proliferative diabetic retinopathy; CSME clinically significant macular edema; DME diabetic macular edema; dbh dot blot hemorrhages; CWS cotton wool spot; POAG primary open angle glaucoma; C/D cup-to-disc ratio; HVF humphrey visual field; GVF goldmann visual field; OCT optical coherence tomography; IOP intraocular pressure; BRVO Branch retinal vein occlusion; CRVO central retinal vein occlusion; CRAO central retinal artery occlusion; BRAO branch retinal artery occlusion; RT retinal tear; SB scleral buckle; PPV pars plana vitrectomy; VH Vitreous hemorrhage; PRP panretinal laser photocoagulation; IVK intravitreal kenalog; VMT vitreomacular traction; MH Macular hole;  NVD neovascularization of the disc; NVE neovascularization elsewhere; AREDS age related eye disease study; ARMD age related macular degeneration; POAG primary open angle glaucoma; EBMD epithelial/anterior basement membrane dystrophy; ACIOL anterior chamber intraocular lens; IOL intraocular lens; PCIOL posterior chamber intraocular lens; Phaco/IOL phacoemulsification with intraocular lens placement; PRK photorefractive keratectomy; LASIK laser assisted in situ keratomileusis; HTN hypertension; DM diabetes mellitus; COPD chronic obstructive pulmonary disease

## 2020-01-14 NOTE — Assessment & Plan Note (Signed)
CSME OS has improved rather remarkably in 5 days status post intravitreal Avastin, will continue to monitor

## 2020-01-14 NOTE — Assessment & Plan Note (Signed)
We will commence intravitreal Avastin OD today for CSME  OD PRP in the future to calm the peripheral retinal nonperfusion

## 2020-01-14 NOTE — Patient Instructions (Signed)
Patient was asked to report if new onset visual acuity decline or distortions or floaters

## 2020-01-23 ENCOUNTER — Encounter (INDEPENDENT_AMBULATORY_CARE_PROVIDER_SITE_OTHER): Payer: Medicare Other | Admitting: Ophthalmology

## 2020-02-13 ENCOUNTER — Other Ambulatory Visit: Payer: Self-pay

## 2020-02-13 ENCOUNTER — Ambulatory Visit (INDEPENDENT_AMBULATORY_CARE_PROVIDER_SITE_OTHER): Payer: Medicare Other | Admitting: Ophthalmology

## 2020-02-13 ENCOUNTER — Encounter (INDEPENDENT_AMBULATORY_CARE_PROVIDER_SITE_OTHER): Payer: Self-pay | Admitting: Ophthalmology

## 2020-02-13 DIAGNOSIS — Z794 Long term (current) use of insulin: Secondary | ICD-10-CM

## 2020-02-13 DIAGNOSIS — H43822 Vitreomacular adhesion, left eye: Secondary | ICD-10-CM | POA: Diagnosis not present

## 2020-02-13 DIAGNOSIS — E113512 Type 2 diabetes mellitus with proliferative diabetic retinopathy with macular edema, left eye: Secondary | ICD-10-CM | POA: Diagnosis not present

## 2020-02-13 DIAGNOSIS — E113511 Type 2 diabetes mellitus with proliferative diabetic retinopathy with macular edema, right eye: Secondary | ICD-10-CM | POA: Diagnosis not present

## 2020-02-13 DIAGNOSIS — E113312 Type 2 diabetes mellitus with moderate nonproliferative diabetic retinopathy with macular edema, left eye: Secondary | ICD-10-CM

## 2020-02-13 DIAGNOSIS — H35372 Puckering of macula, left eye: Secondary | ICD-10-CM

## 2020-02-13 MED ORDER — BEVACIZUMAB CHEMO INJECTION 1.25MG/0.05ML SYRINGE FOR KALEIDOSCOPE
1.2500 mg | INTRAVITREAL | Status: AC | PRN
  Administered 2020-02-13: 1.25 mg via INTRAVITREAL

## 2020-02-13 NOTE — Assessment & Plan Note (Signed)
Follow-up OD as scheduled °

## 2020-02-13 NOTE — Progress Notes (Signed)
02/13/2020     CHIEF COMPLAINT Patient presents for Retina Follow Up   HISTORY OF PRESENT ILLNESS: Ann Gomez is a 69 y.o. female who presents to the clinic today for:   HPI    Retina Follow Up    Patient presents with  Diabetic Retinopathy.  In left eye.  This started 5 weeks ago.  Severity is mild.  Duration of 5 weeks.  Since onset it is gradually improving.          Comments    5 Week Diabetic F/U OS, poss Avastin OS  Pt sts VA in both eyes is "great" and improved. Pt denies ocular pain, flashes of light, or floaters OU.  LBS: 190 "something" checked 4 days ago        Last edited by Ileana Roup, COA on 02/13/2020  8:50 AM. (History)      Referring physician: No referring provider defined for this encounter.  HISTORICAL INFORMATION:   Selected notes from the MEDICAL RECORD NUMBER    Lab Results  Component Value Date   HGBA1C 9.6 (H) 06/08/2009     CURRENT MEDICATIONS: Current Outpatient Medications (Ophthalmic Drugs)  Medication Sig  . dorzolamide-timolol (COSOPT) 22.3-6.8 MG/ML ophthalmic solution 1 drop 2 (two) times daily.  Marland Kitchen latanoprost (XALATAN) 0.005 % ophthalmic solution 1 drop daily.   No current facility-administered medications for this visit. (Ophthalmic Drugs)   Current Outpatient Medications (Other)  Medication Sig  . amLODipine (NORVASC) 10 MG tablet Take 10 mg by mouth daily.  Marland Kitchen aspirin EC 81 MG tablet Take 1 tablet (81 mg total) by mouth daily.  . benazepril (LOTENSIN) 40 MG tablet Take 1 tablet (40 mg total) by mouth daily.  . furosemide (LASIX) 40 MG tablet Take 1 tablet (40 mg total) by mouth daily.  . insulin glargine (LANTUS) 100 UNIT/ML injection Inject 30 Units into the skin every morning. 25 units in the pm  . loperamide (IMODIUM) 2 MG capsule Take 1 capsule (2 mg total) by mouth 2 (two) times daily as needed for diarrhea or loose stools.  Marland Kitchen losartan (COZAAR) 100 MG tablet Take 100 mg by mouth daily.  . metFORMIN  (GLUCOPHAGE) 1000 MG tablet Take 1 tablet (1,000 mg total) by mouth 2 (two) times daily with a meal.  . metoprolol (LOPRESSOR) 50 MG tablet Take 1 tablet (50 mg total) by mouth 2 (two) times daily.  . ondansetron (ZOFRAN) 4 MG tablet Take 1 tablet (4 mg total) by mouth every 8 (eight) hours as needed for nausea or vomiting.  . pravastatin (PRAVACHOL) 20 MG tablet Take 20 mg by mouth at bedtime.  Marland Kitchen terazosin (HYTRIN) 1 MG capsule Take 2 capsules (2 mg total) by mouth at bedtime.   No current facility-administered medications for this visit. (Other)      REVIEW OF SYSTEMS:    ALLERGIES Allergies  Allergen Reactions  . Hydrochlorothiazide Other (See Comments)    dizzy    PAST MEDICAL HISTORY Past Medical History:  Diagnosis Date  . Diabetes mellitus   . Edema   . Essential hypertension, benign   . Hypertension   . Obesity, unspecified    Past Surgical History:  Procedure Laterality Date  . CESAREAN SECTION      FAMILY HISTORY History reviewed. No pertinent family history.  SOCIAL HISTORY Social History   Tobacco Use  . Smoking status: Never Smoker  . Smokeless tobacco: Never Used  Substance Use Topics  . Alcohol use: No  . Drug use:  No         OPHTHALMIC EXAM:  Base Eye Exam    Visual Acuity (ETDRS)      Right Left   Dist [place] 20/25 +1 20/30 -1   Dist ph [place]  20/30 +2       Tonometry (Tonopen, 8:47 AM)      Right Left   Pressure 24 23       Pupils      Pupils Dark Light Shape React APD   Right PERRL 3 2 Round Sluggish None   Left PERRL 3 2 Round Sluggish None       Visual Fields (Counting fingers)      Left Right    Full Full       Extraocular Movement      Right Left    Full Full       Neuro/Psych    Mood/Affect: Normal       Dilation    Left eye: 1.0% Mydriacyl, 2.5% Phenylephrine @ 8:54 AM        Slit Lamp and Fundus Exam    External Exam      Right Left   External Normal Normal       Slit Lamp Exam      Right Left    Lids/Lashes Normal Normal   Conjunctiva/Sclera White and quiet White and quiet   Cornea Clear Clear   Anterior Chamber Deep and quiet Deep and quiet   Iris Round and reactive Round and reactive   Anterior Vitreous Normal Normal       Fundus Exam      Right Left   Posterior Vitreous  Normal   Disc  Neovascularization   C/D Ratio  0.45   Macula  Microaneurysms, Mild clinically significant macular edema   Vessels  PDR-active   Periphery  Dot-blot hemorrhage          IMAGING AND PROCEDURES  Imaging and Procedures for 02/13/20  OCT, Retina - OU - Both Eyes       Right Eye Quality was good. Scan locations included subfoveal. Progression has improved. Findings include abnormal foveal contour, cystoid macular edema.   Left Eye Quality was good. Scan locations included subfoveal. Central Foveal Thickness: 303. Progression has improved. Findings include vitreomacular adhesion , vitreous traction, epiretinal membrane, cystoid macular edema, abnormal foveal contour.   Notes OD, much less CSME superior to the fovea, will consider focal laser treatment to the residual edema to decrease treatment burden.  OS, much less CSME as compared to 6 weeks prior, vitreal retinal traction on the macula as well as epiretinal membrane do persist however.  We will repeat injection Avastin OS today and examination in 7 weeks OS       Intravitreal Injection, Pharmacologic Agent - OS - Left Eye       Time Out 02/13/2020. 9:51 AM. Confirmed correct patient, procedure, site, and patient consented.   Anesthesia Topical anesthesia was used. Anesthetic medications included Akten 3.5%.   Procedure Preparation included Tobramycin 0.3%, 10% betadine to eyelids, 5% betadine to ocular surface. A supplied needle was used.   Injection:  1.25 mg Bevacizumab (AVASTIN) SOLN   NDC: 70360-001-02, Lot: [id number]   Route: Intravitreal, Site: Left Eye, Waste: 0 mg  Post-op Post injection exam found visual  acuity of at least counting fingers. The patient tolerated the procedure well. There were no complications. The patient received written and verbal post procedure care education. Post injection medications were not given.  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 type 2 diabetes mellitus (HCC) OS, much less CSME as compared to 6 weeks prior, vitreal retinal traction on the macula as well as epiretinal membrane do persist however.  We will repeat injection Avastin OS today and examination in 7 weeks OS  Diabetic macular edema of right eye with proliferative retinopathy associated with type 2 diabetes mellitus (HCC) Follow-up OD as scheduled  Vitreomacular adhesion of left eye We will continue to monitor  Macular pucker, left eye OS, minor overall at this time will observe      ICD-10-CM   1. Controlled type 2 diabetes mellitus with left eye affected by moderate nonproliferative retinopathy and macular edema, with long-term current use of insulin (HCC)  E11.3312 OCT, Retina - OU - Both Eyes   Z79.4 Intravitreal Injection, Pharmacologic Agent - OS - Left Eye    Bevacizumab (AVASTIN) SOLN 1.25 mg  2. Proliferative diabetic retinopathy of left eye with macular edema associated with type 2 diabetes mellitus (HCC)  K93.8182   3. 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 Vitreomacular adhesion of left eye  H43.822   5. Macular pucker, left eye  H35.372     1.  CSME OS vastly improved status post intravitreal antivegF (Avastin #1 ).  2.  Vitreal macular traction and epiretinal membrane left eye do persist.  Will repeat injection Avastin today and examination left eye in 7 weeks  3.  OD follow-up as scheduled  Ophthalmic Meds Ordered this visit:  Meds ordered this encounter  Medications  . Bevacizumab (AVASTIN) SOLN 1.25 mg         Return in about 7 weeks (around 04/02/2020) for dilate, OS, AVASTIN OCT, COLOR FP.  Patient Instructions  Contact the office promptly for new onset visual acuity declines or difficulties    Explained the diagnoses, plan, and follow up with the patient and they expressed understanding.  Patient expressed understanding of the importance of proper follow up care.   Alford Highland Ferris Tally M.D. Diseases & Surgery of the Retina and Vitreous [facility] 02/13/20     Abbreviations: M myopia (nearsighted); A astigmatism; H hyperopia (farsighted); P presbyopia; Mrx spectacle prescription;  CTL contact lenses; OD right eye; OS left eye; OU both eyes  XT exotropia; ET esotropia; PEK punctate epithelial keratitis; PEE punctate epithelial erosions; DES dry eye syndrome; MGD meibomian gland dysfunction; ATs artificial tears; PFAT's preservative free artificial tears; NSC nuclear sclerotic cataract; PSC posterior subcapsular cataract; ERM epi-retinal membrane; PVD posterior vitreous detachment; RD retinal detachment; DM diabetes mellitus; DR diabetic retinopathy; NPDR non-proliferative diabetic retinopathy; PDR proliferative diabetic retinopathy; CSME clinically significant macular edema; DME diabetic macular edema; dbh dot blot hemorrhages; CWS cotton wool spot; POAG primary open angle glaucoma; C/D cup-to-disc ratio; HVF humphrey visual field; GVF goldmann visual field; OCT optical coherence tomography; IOP intraocular pressure; BRVO Branch retinal vein occlusion; CRVO central retinal vein occlusion; CRAO central retinal artery occlusion; BRAO branch retinal artery occlusion; RT retinal tear; SB scleral buckle; PPV pars plana vitrectomy; VH Vitreous hemorrhage; PRP panretinal laser photocoagulation; IVK intravitreal kenalog; VMT vitreomacular traction; MH Macular hole;  NVD neovascularization of the disc; NVE neovascularization elsewhere; AREDS age related eye disease study; ARMD age related  macular degeneration; POAG primary open angle glaucoma; EBMD epithelial/anterior basement membrane dystrophy; ACIOL anterior chamber intraocular lens; IOL intraocular lens; PCIOL posterior chamber intraocular  lens; Phaco/IOL phacoemulsification with intraocular lens placement; [place] photorefractive keratectomy; LASIK laser assisted in situ keratomileusis; HTN hypertension; DM diabetes mellitus; COPD chronic obstructive pulmonary disease

## 2020-02-13 NOTE — Assessment & Plan Note (Signed)
OS, much less CSME as compared to 6 weeks prior, vitreal retinal traction on the macula as well as epiretinal membrane do persist however.  We will repeat injection Avastin OS today and examination in 7 weeks OS

## 2020-02-13 NOTE — Assessment & Plan Note (Signed)
We will continue to monitor.

## 2020-02-13 NOTE — Assessment & Plan Note (Signed)
OS, minor overall at this time will observe

## 2020-02-13 NOTE — Patient Instructions (Signed)
Contact the office promptly for new onset visual acuity declines or difficulties

## 2020-02-18 ENCOUNTER — Encounter (INDEPENDENT_AMBULATORY_CARE_PROVIDER_SITE_OTHER): Payer: Medicare Other | Admitting: Ophthalmology

## 2020-02-27 ENCOUNTER — Other Ambulatory Visit: Payer: Self-pay

## 2020-02-27 ENCOUNTER — Encounter (INDEPENDENT_AMBULATORY_CARE_PROVIDER_SITE_OTHER): Payer: Self-pay | Admitting: Ophthalmology

## 2020-02-27 ENCOUNTER — Ambulatory Visit (INDEPENDENT_AMBULATORY_CARE_PROVIDER_SITE_OTHER): Payer: Medicare Other | Admitting: Ophthalmology

## 2020-02-27 DIAGNOSIS — E113511 Type 2 diabetes mellitus with proliferative diabetic retinopathy with macular edema, right eye: Secondary | ICD-10-CM

## 2020-02-27 DIAGNOSIS — E113512 Type 2 diabetes mellitus with proliferative diabetic retinopathy with macular edema, left eye: Secondary | ICD-10-CM | POA: Diagnosis not present

## 2020-02-27 MED ORDER — BEVACIZUMAB CHEMO INJECTION 1.25MG/0.05ML SYRINGE FOR KALEIDOSCOPE
1.2500 mg | INTRAVITREAL | Status: AC | PRN
  Administered 2020-02-27: 1.25 mg via INTRAVITREAL

## 2020-02-27 NOTE — Progress Notes (Signed)
02/27/2020     CHIEF COMPLAINT Patient presents for Retina Follow Up   HISTORY OF PRESENT ILLNESS: Ann Gomez is a 69 y.o. female who presents to the clinic today for:   HPI    Retina Follow Up    Patient presents with  Diabetic Retinopathy.  In right eye.  Severity is moderate.  Duration of 6 weeks.  Since onset it is stable.          Comments    6 Week PDR f\u OD. Possible Avastin OD. OCT  Pt states no changes in vision. Denies any complaints. BGL: did not check       Last edited by Elyse Jarvis on 02/27/2020  9:43 AM. (History)      Referring physician: No referring provider defined for this encounter.  HISTORICAL INFORMATION:   Selected notes from the MEDICAL RECORD NUMBER    Lab Results  Component Value Date   HGBA1C 9.6 (H) 06/08/2009     CURRENT MEDICATIONS: Current Outpatient Medications (Ophthalmic Drugs)  Medication Sig  . dorzolamide-timolol (COSOPT) 22.3-6.8 MG/ML ophthalmic solution 1 drop 2 (two) times daily.  Marland Kitchen latanoprost (XALATAN) 0.005 % ophthalmic solution 1 drop daily.   No current facility-administered medications for this visit. (Ophthalmic Drugs)   Current Outpatient Medications (Other)  Medication Sig  . amLODipine (NORVASC) 10 MG tablet Take 10 mg by mouth daily.  Marland Kitchen aspirin EC 81 MG tablet Take 1 tablet (81 mg total) by mouth daily.  . benazepril (LOTENSIN) 40 MG tablet Take 1 tablet (40 mg total) by mouth daily.  . furosemide (LASIX) 40 MG tablet Take 1 tablet (40 mg total) by mouth daily.  . insulin glargine (LANTUS) 100 UNIT/ML injection Inject 30 Units into the skin every morning. 25 units in the pm  . loperamide (IMODIUM) 2 MG capsule Take 1 capsule (2 mg total) by mouth 2 (two) times daily as needed for diarrhea or loose stools.  Marland Kitchen losartan (COZAAR) 100 MG tablet Take 100 mg by mouth daily.  . metFORMIN (GLUCOPHAGE) 1000 MG tablet Take 1 tablet (1,000 mg total) by mouth 2 (two) times daily with a meal.  . metoprolol  (LOPRESSOR) 50 MG tablet Take 1 tablet (50 mg total) by mouth 2 (two) times daily.  . ondansetron (ZOFRAN) 4 MG tablet Take 1 tablet (4 mg total) by mouth every 8 (eight) hours as needed for nausea or vomiting.  . pravastatin (PRAVACHOL) 20 MG tablet Take 20 mg by mouth at bedtime.  Marland Kitchen terazosin (HYTRIN) 1 MG capsule Take 2 capsules (2 mg total) by mouth at bedtime.   No current facility-administered medications for this visit. (Other)      REVIEW OF SYSTEMS: ROS    Positive for: Endocrine   Last edited by Elyse Jarvis on 02/27/2020  9:43 AM. (History)       ALLERGIES Allergies  Allergen Reactions  . Hydrochlorothiazide Other (See Comments)    dizzy    PAST MEDICAL HISTORY Past Medical History:  Diagnosis Date  . Diabetes mellitus   . Edema   . Essential hypertension, benign   . Hypertension   . Obesity, unspecified    Past Surgical History:  Procedure Laterality Date  . CESAREAN SECTION      FAMILY HISTORY History reviewed. No pertinent family history.  SOCIAL HISTORY Social History   Tobacco Use  . Smoking status: Never Smoker  . Smokeless tobacco: Never Used  Substance Use Topics  . Alcohol use: No  .  Drug use: No         OPHTHALMIC EXAM:  Base Eye Exam    Visual Acuity (Snellen - Linear)      Right Left   Dist [place] 20/25 + 20/25 -1       Tonometry (Tonopen, 9:48 AM)      Right Left   Pressure 21 23       Pupils      Pupils Dark Light Shape React APD   Right PERRL 3 2 Round Sluggish None   Left PERRL 3 2 Round Sluggish None       Visual Fields (Counting fingers)      Left Right    Full Full       Neuro/Psych    Mood/Affect: Normal       Dilation    Right eye: 1.0% Mydriacyl, 2.5% Phenylephrine @ 9:48 AM        Slit Lamp and Fundus Exam    External Exam      Right Left   External Normal Normal       Slit Lamp Exam      Right Left   Lids/Lashes Normal Normal   Conjunctiva/Sclera White and quiet White and quiet    Cornea Clear Clear   Anterior Chamber Deep and quiet Deep and quiet   Iris Round and reactive Round and reactive   Lens Centered posterior chamber intraocular lens Centered posterior chamber intraocular lens   Anterior Vitreous Normal Normal       Fundus Exam      Right Left   Posterior Vitreous Normal    Disc Neovascularization    C/D Ratio 0.45    Macula Microaneurysms, Mild clinically significant macular edema    Vessels PDR-quiet    Periphery Dot-blot hemorrhage           IMAGING AND PROCEDURES  Imaging and Procedures for 02/27/20  OCT, Retina - OU - Both Eyes       Right Eye Quality was good. Scan locations included subfoveal. Central Foveal Thickness: 250. Progression has improved.   Left Eye Quality was good. Scan locations included subfoveal. Central Foveal Thickness: 291.   Notes CSME superior the fovea continues to improve nicely on intravitreal Avastin, currently 5-week interval.  We will repeat injection today, and follow-up in 2 to 3 weeks for focal laser treatment to increase the treatment burden of noncenter involved CSME       Intravitreal Injection, Pharmacologic Agent - OD - Right Eye       Time Out 02/27/2020. 10:57 AM. Confirmed correct patient, procedure, site, and patient consented.   Anesthesia Topical anesthesia was used. Anesthetic medications included [place] 3.5%.   Procedure Preparation included Tobramycin 0.3%, 10% betadine to eyelids, 5% betadine to ocular surface. A supplied needle was used.   Injection:  1.25 mg Bevacizumab (AVASTIN) SOLN   NDC: 70360-001-02, Lot: [id number]   Route: Intravitreal, Site: Right Eye, Waste: 0 mg  Post-op Post injection exam found visual acuity of at least counting fingers. The patient tolerated the procedure well. There were no complications. The patient received written and verbal post procedure care education. Post injection medications were not given.                  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  CSME OD vastly improved on intravitreal Avastin.  We will repeat injection today follow-up in 2 weeks for focal laser superior to the fovea right eye for noncenter involved etiology of CSME in order to  decrease treatment burden  2.  3.  Ophthalmic Meds Ordered this visit:  Meds ordered this encounter  Medications  . Bevacizumab (AVASTIN) SOLN 1.25 mg       Return in about 2 weeks (around 03/12/2020) for dilate, OCT, DILATE OU, FOCAL, OD.  There are no Patient Instructions on file for this visit.   Explained the diagnoses, plan, and follow up with the patient and they expressed understanding.  Patient expressed understanding of the importance of proper follow up care.   Alford Highland Truett Mcfarlan M.D. Diseases & Surgery of the Retina and Vitreous [facility] 02/27/20     Abbreviations: M myopia (nearsighted); A astigmatism; H hyperopia (farsighted); P presbyopia; Mrx spectacle prescription;  CTL contact lenses; OD right  eye; OS left eye; OU both eyes  XT exotropia; ET esotropia; PEK punctate epithelial keratitis; PEE punctate epithelial erosions; DES dry eye syndrome; MGD meibomian gland dysfunction; ATs artificial tears; PFAT's preservative free artificial tears; NSC nuclear sclerotic cataract; PSC posterior subcapsular cataract; ERM epi-retinal membrane; PVD posterior vitreous detachment; RD retinal detachment; DM diabetes mellitus; DR diabetic retinopathy; NPDR non-proliferative diabetic retinopathy; PDR proliferative diabetic retinopathy; CSME clinically significant macular edema; DME diabetic macular edema; dbh dot blot hemorrhages; CWS cotton wool spot; POAG primary open angle glaucoma; C/D cup-to-disc ratio; HVF humphrey visual field; GVF goldmann visual field; OCT optical coherence tomography; IOP intraocular pressure; BRVO Branch retinal vein occlusion; CRVO central retinal vein occlusion; CRAO central retinal artery occlusion; BRAO branch retinal artery occlusion; RT retinal tear; SB scleral buckle; PPV pars plana vitrectomy; VH Vitreous hemorrhage; PRP panretinal laser photocoagulation; IVK intravitreal kenalog; VMT vitreomacular traction; MH Macular hole;  NVD neovascularization of the disc; NVE neovascularization elsewhere; AREDS age related eye disease study; ARMD age related macular degeneration; POAG primary open angle glaucoma; EBMD epithelial/anterior basement membrane dystrophy; ACIOL anterior chamber intraocular lens; IOL intraocular lens; PCIOL posterior chamber intraocular lens; Phaco/IOL phacoemulsification with intraocular lens placement; PRK photorefractive keratectomy; LASIK laser assisted in situ keratomileusis; HTN hypertension; DM diabetes mellitus; COPD chronic obstructive pulmonary disease

## 2020-02-27 NOTE — Assessment & Plan Note (Signed)
CSME superior the fovea continues to improve nicely on intravitreal Avastin, currently 5-week interval.  We will repeat injection today, and follow-up in 2 to 3 weeks for focal laser treatment to increase the treatment burden of noncenter involved CSME

## 2020-02-27 NOTE — Assessment & Plan Note (Signed)
No active CSME left eye at this time, will observe

## 2020-03-12 ENCOUNTER — Ambulatory Visit (INDEPENDENT_AMBULATORY_CARE_PROVIDER_SITE_OTHER): Payer: Medicare Other | Admitting: Ophthalmology

## 2020-03-12 ENCOUNTER — Encounter (INDEPENDENT_AMBULATORY_CARE_PROVIDER_SITE_OTHER): Payer: Self-pay | Admitting: Ophthalmology

## 2020-03-12 ENCOUNTER — Other Ambulatory Visit: Payer: Self-pay

## 2020-03-12 DIAGNOSIS — E113511 Type 2 diabetes mellitus with proliferative diabetic retinopathy with macular edema, right eye: Secondary | ICD-10-CM | POA: Diagnosis not present

## 2020-03-12 DIAGNOSIS — E113512 Type 2 diabetes mellitus with proliferative diabetic retinopathy with macular edema, left eye: Secondary | ICD-10-CM

## 2020-03-12 NOTE — Assessment & Plan Note (Signed)
Follow-up OS for CSME as scheduled

## 2020-03-12 NOTE — Progress Notes (Signed)
03/12/2020     CHIEF COMPLAINT Patient presents for Retina Follow Up   HISTORY OF PRESENT ILLNESS: Ann Gomez is a 69 y.o. female who presents to the clinic today for:   HPI    Retina Follow Up    Patient presents with  Diabetic Retinopathy.  In both eyes.  This started 2 weeks ago.  Severity is mild.  Duration of 2 weeks.  Since onset it is stable.          Comments    2 Week F/U OU, Focal OD  Pt denies noticeable changes to [place] OU since last visit. Pt denies ocular pain, flashes of light, or floaters OU.  LBS: did not check       Last edited by Ileana Roup, COA on 03/12/2020  9:32 AM. (History)      Referring physician: Sherral Hammers, FNP [phone] [address] [place],  [place] [address]  HISTORICAL INFORMATION:   Selected notes from the MEDICAL RECORD NUMBER    Lab Results  Component Value Date   HGBA1C 9.6 (H) 06/08/2009     CURRENT MEDICATIONS: Current Outpatient Medications (Ophthalmic Drugs)  Medication Sig  . dorzolamide-timolol (COSOPT) 22.3-6.8 MG/ML ophthalmic solution 1 drop 2 (two) times daily.  Marland Kitchen latanoprost (XALATAN) 0.005 % ophthalmic solution 1 drop daily.   No current facility-administered medications for this visit. (Ophthalmic Drugs)   Current Outpatient Medications (Other)  Medication Sig  . amLODipine (NORVASC) 10 MG tablet Take 10 mg by mouth daily.  Marland Kitchen aspirin EC 81 MG tablet Take 1 tablet (81 mg total) by mouth daily.  . benazepril (LOTENSIN) 40 MG tablet Take 1 tablet (40 mg total) by mouth daily.  . furosemide (LASIX) 40 MG tablet Take 1 tablet (40 mg total) by mouth daily.  . insulin glargine (LANTUS) 100 UNIT/ML injection Inject 30 Units into the skin every morning. 25 units in the pm  . loperamide (IMODIUM) 2 MG capsule Take 1 capsule (2 mg total) by mouth 2 (two) times daily as needed for diarrhea or loose stools.  Marland Kitchen losartan (COZAAR) 100 MG tablet Take 100 mg by mouth daily.  . metFORMIN (GLUCOPHAGE) 1000 MG tablet  Take 1 tablet (1,000 mg total) by mouth 2 (two) times daily with a meal.  . metoprolol (LOPRESSOR) 50 MG tablet Take 1 tablet (50 mg total) by mouth 2 (two) times daily.  . ondansetron (ZOFRAN) 4 MG tablet Take 1 tablet (4 mg total) by mouth every 8 (eight) hours as needed for nausea or vomiting.  . pravastatin (PRAVACHOL) 20 MG tablet Take 20 mg by mouth at bedtime.  Marland Kitchen terazosin (HYTRIN) 1 MG capsule Take 2 capsules (2 mg total) by mouth at bedtime.   No current facility-administered medications for this visit. (Other)      REVIEW OF SYSTEMS:    ALLERGIES Allergies  Allergen Reactions  . Hydrochlorothiazide Other (See Comments)    dizzy    PAST MEDICAL HISTORY Past Medical History:  Diagnosis Date  . Diabetes mellitus   . Edema   . Essential hypertension, benign   . Hypertension   . Obesity, unspecified    Past Surgical History:  Procedure Laterality Date  . CESAREAN SECTION      FAMILY HISTORY History reviewed. No pertinent family history.  SOCIAL HISTORY Social History   Tobacco Use  . Smoking status: Never Smoker  . Smokeless tobacco: Never Used  Substance Use Topics  . Alcohol use: No  . Drug use: No  OPHTHALMIC EXAM: Base Eye Exam    Visual Acuity (ETDRS)      Right Left   Dist [place] 20/20 20/25 +2       Tonometry (Tonopen, 9:32 AM)      Right Left   Pressure 28 21       Tonometry #2 (Tonopen, 9:37 AM)      Right Left   Pressure 29        Tonometry #3 (Tonopen, 9:38 AM)      Right Left   Pressure 17        Tonometry Comments   Pt holding breath       Pupils      Pupils Dark Light Shape React APD   Right PERRL 3 2 Round Sluggish None   Left PERRL 3 2 Round Sluggish None       Visual Fields (Counting fingers)      Left Right    Full Full       Extraocular Movement      Right Left    Full Full       Neuro/Psych    Oriented x3: Yes   Mood/Affect: Normal       Dilation    Both eyes: 1.0% Mydriacyl, 2.5%  Phenylephrine @ 9:37 AM        Slit Lamp and Fundus Exam    External Exam      Right Left   External Normal Normal       Slit Lamp Exam      Right Left   Lids/Lashes Normal Normal   Conjunctiva/Sclera White and quiet White and quiet   Cornea Clear Clear   Anterior Chamber Deep and quiet Deep and quiet   Iris Round and reactive Round and reactive   Lens Centered posterior chamber intraocular lens Centered posterior chamber intraocular lens   Anterior Vitreous Normal Normal       Fundus Exam      Right Left   Posterior Vitreous Normal    Disc Neovascularization    C/D Ratio 0.45    Macula Microaneurysms, Mild clinically significant macular edema    Vessels PDR-quiet    Periphery Dot-blot hemorrhage           IMAGING AND PROCEDURES  Imaging and Procedures for 03/12/20  OCT, Retina - OU - Both Eyes       Right Eye Quality was good. Scan locations included subfoveal. Central Foveal Thickness: 253. Progression has improved. Findings include abnormal foveal contour.   Left Eye Quality was good. Scan locations included subfoveal. Central Foveal Thickness: 284. Progression has been stable. Findings include normal foveal contour.   Notes CSME OD much improved superior to the fovea, will deliver focal laser treatment today To this region to prevent noncentral involved CSME recurrence  OS remained stable       Focal Laser - OD - Right Eye       Time Out Confirmed correct patient, procedure, site, and patient consented.   Anesthesia Topical anesthesia was used. Anesthetic medications included Proparacaine 0.5%.   Laser Information The type of laser was diode. Color was yellow. The duration in seconds was 0.1. The spot size was 100 microns. Laser power was 50. Total spots was 104.   Post-op The patient tolerated the procedure well. There were no complications. The patient received written and verbal post procedure care education.                   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 Focal laser applied OD today superiorly, without complication and attempt to decrease treatment burden and visits  2. Left eye is scheduled approximately 1 month for consideration of Avastin OS  3.  Ophthalmic Meds Ordered this visit:  No orders of the defined types were placed in this encounter.      Return for As scheduled, dilate, OS, AVASTIN OCT.  There are no Patient Instructions on file for this visit.   Explained the diagnoses, plan, and follow up with the patient and they expressed understanding.  Patient expressed understanding of the importance of proper follow up care.   Alford Highland Robet Crutchfield M.D. Diseases & Surgery of the Retina and Vitreous [facility] 03/12/20     Abbreviations: M myopia (nearsighted); A astigmatism; H hyperopia (farsighted); P presbyopia; Mrx spectacle prescription;  CTL contact lenses; OD right eye; OS left eye; OU both eyes  XT exotropia; ET esotropia; PEK punctate epithelial keratitis; PEE punctate epithelial erosions; DES dry eye syndrome; MGD meibomian gland dysfunction; ATs artificial tears; PFAT's  preservative free artificial tears; NSC nuclear sclerotic cataract; PSC posterior subcapsular cataract; ERM epi-retinal membrane; PVD posterior vitreous detachment; RD retinal detachment; DM diabetes mellitus; DR diabetic retinopathy; NPDR non-proliferative diabetic retinopathy; PDR proliferative diabetic retinopathy; CSME clinically significant macular edema; DME diabetic macular edema; dbh dot blot hemorrhages; CWS cotton wool spot; POAG primary open angle glaucoma; C/D cup-to-disc ratio; HVF humphrey visual field; GVF goldmann visual field; OCT optical coherence tomography; IOP intraocular pressure; BRVO Branch retinal vein occlusion; CRVO central retinal vein occlusion; CRAO central retinal artery occlusion; BRAO branch retinal artery occlusion; RT retinal tear; SB scleral buckle; PPV pars plana vitrectomy; VH Vitreous hemorrhage; PRP panretinal laser photocoagulation; IVK intravitreal kenalog; VMT vitreomacular traction; MH Macular hole;  NVD neovascularization of the disc; NVE neovascularization elsewhere; AREDS age related eye disease study; ARMD age related macular degeneration; POAG primary open angle glaucoma; EBMD epithelial/anterior basement membrane dystrophy; ACIOL anterior chamber intraocular lens; IOL intraocular lens; PCIOL posterior chamber intraocular lens; Phaco/IOL phacoemulsification with intraocular lens placement; PRK photorefractive keratectomy; LASIK laser assisted in situ keratomileusis; HTN hypertension; DM diabetes mellitus; COPD chronic obstructive pulmonary disease

## 2020-03-12 NOTE — Assessment & Plan Note (Signed)
CSME component of disease superior to the fovea improved now 2 weeks post intravitreal Avastin will deliver focal laser today to prevent recurrence of CSME and to decrease treatment burden

## 2020-03-16 ENCOUNTER — Other Ambulatory Visit: Payer: Self-pay

## 2020-03-16 ENCOUNTER — Encounter (HOSPITAL_COMMUNITY): Payer: Self-pay | Admitting: Emergency Medicine

## 2020-03-16 ENCOUNTER — Ambulatory Visit (HOSPITAL_COMMUNITY)
Admission: EM | Admit: 2020-03-16 | Discharge: 2020-03-16 | Disposition: A | Discharge status: Home / Self Care | Payer: Medicare Other | Attending: Emergency Medicine | Admitting: Emergency Medicine

## 2020-03-16 ENCOUNTER — Ambulatory Visit (INDEPENDENT_AMBULATORY_CARE_PROVIDER_SITE_OTHER): Payer: Medicare Other

## 2020-03-16 DIAGNOSIS — S4991XA Unspecified injury of right shoulder and upper arm, initial encounter: Secondary | ICD-10-CM | POA: Diagnosis not present

## 2020-03-16 DIAGNOSIS — W19XXXA Unspecified fall, initial encounter: Secondary | ICD-10-CM

## 2020-03-16 DIAGNOSIS — S42294A Other nondisplaced fracture of upper end of right humerus, initial encounter for closed fracture: Secondary | ICD-10-CM

## 2020-03-16 NOTE — ED Triage Notes (Signed)
Pt presents with right shoulder pain after a fall off a curb today.

## 2020-03-16 NOTE — Discharge Instructions (Addendum)
May use ice and tylenol as needed for pain  Will need to follow up with ortho with in the next few weeks  If pain becomes worse may need to re xray  Keep splint on and elevated to help with swelling

## 2020-03-16 NOTE — ED Provider Notes (Signed)
[facility]    CSN: [id number] Arrival date & time: 03/16/20  1614      History   Chief Complaint Chief Complaint  Patient presents with   Fall    HPI Ann Gomez is a 69 y.o. female.   Larey Seat earlier today while walking him. States that she tripped on shoes and fell hit rt upper arm. Denies any loc, did not hit her head. Went back to work after the fall. Has pain to rt arm not able to lift without using lt arm to assist. Pain when touching upper part of arm . Did not take anything pta.      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      Home Medications    Prior to Admission medications   Medication Sig Start Date End Date Taking? Authorizing Provider  amLODipine (NORVASC) 10 MG tablet Take 10 mg by mouth daily. 11/28/19   [provider]  aspirin EC 81 MG tablet Take 1 tablet (81 mg total) by mouth daily. 04/16/12   Ghimire, Werner Lean, MD  benazepril (LOTENSIN) 40 MG tablet Take 1 tablet (40 mg total) by mouth daily. 04/16/12    Ghimire, Werner Lean, MD  dorzolamide-timolol (COSOPT) 22.3-6.8 MG/ML ophthalmic solution 1 drop 2 (two) times daily. 11/28/19   [provider]  furosemide (LASIX) 40 MG tablet Take 1 tablet (40 mg total) by mouth daily. 04/16/12   Ghimire, Werner Lean, MD  insulin glargine (LANTUS) 100 UNIT/ML injection Inject 30 Units into the skin every morning. 25 units in the pm    [provider]  latanoprost (XALATAN) 0.005 % ophthalmic solution 1 drop daily. 12/17/19   [provider]  loperamide (IMODIUM) 2 MG capsule Take 1 capsule (2 mg total) by mouth 2 (two) times daily as needed for diarrhea or loose stools. 07/12/15   Mackuen, Courteney Lyn, MD  losartan (COZAAR) 100 MG tablet Take 100 mg by mouth daily. 11/28/19   [provider]  metFORMIN (GLUCOPHAGE) 1000 MG tablet Take 1 tablet (1,000 mg total) by mouth 2 (two) times daily with a meal. 04/16/12   Ghimire, Werner Lean, MD  metoprolol (LOPRESSOR) 50 MG tablet Take 1 tablet (50 mg total) by mouth 2 (two) times daily. 04/16/12   Ghimire, Werner Lean, MD  ondansetron (ZOFRAN) 4 MG tablet Take 1 tablet (4 mg total) by mouth every 8 (eight) hours as needed for nausea or vomiting. 07/12/15   Mackuen, Courteney Lyn, MD  pravastatin (PRAVACHOL) 20 MG  tablet Take 20 mg by mouth at bedtime. 11/28/19   [provider]  terazosin (HYTRIN) 1 MG capsule Take 2 capsules (2 mg total) by mouth at bedtime. 04/16/12   Ghimire, Werner Lean, MD  glipiZIDE (GLUCOTROL XL) 10 MG 24 hr tablet Take 10 mg by mouth daily.  04/16/12  [provider]  metoprolol succinate (TOPROL-XL) 100 MG 24 hr tablet Take 100 mg by mouth daily. Take with or immediately following a meal.  04/16/12  [provider]  rosuvastatin (CRESTOR) 5 MG tablet Take 5 mg by mouth daily.  04/16/12  [provider]    Family History Family History  Family history unknown: Yes    Social History Social History   Tobacco Use   Smoking status:  Never Smoker   Smokeless tobacco: Never Used  Substance Use Topics   Alcohol use: No   Drug use: No     Allergies   Hydrochlorothiazide   Review of Systems Review of Systems  Constitutional: Negative.   Respiratory: Negative.   Cardiovascular: Negative.   Gastrointestinal: Negative.   Musculoskeletal: Positive for joint swelling.       Rt upper arm pain , not able to lift   Skin: Negative.   Neurological: Negative.      Physical Exam Triage Vital Signs ED Triage Vitals  Enc Vitals Group     BP 03/16/20 1745 (!) 156/101     Pulse Rate 03/16/20 1745 100     Resp 03/16/20 1745 18     Temp 03/16/20 1745 98.7 F (37.1 C)     Temp Source 03/16/20 1745 Oral     SpO2 03/16/20 1745 100 %     Weight --      Height --      Head Circumference --      Peak Flow --      Pain Score 03/16/20 1744 8     Pain Loc --      Pain Edu? --      Excl. in GC? --    No data found.  Updated Vital Signs BP (!) 156/101 (BP Location: Left Arm)    Pulse 100    Temp 98.7 F (37.1 C) (Oral)    Resp 18    SpO2 100%   Visual Acuity     Physical Exam Eyes:     Pupils: Pupils are equal, round, and reactive to light.  Cardiovascular:     Rate and Rhythm: Normal rate.  Pulmonary:     Effort: Pulmonary effort is normal.  Musculoskeletal:        General: Tenderness and signs of injury present.     Comments: Decrease rom, not able to lift lateral, no obvious deformity, pain  To palpation. Strong pulses.   Skin:    General: Skin is warm.     Capillary Refill: Capillary refill takes less than 2 seconds.  Neurological:     General: No focal deficit present.     Mental Status: She is alert.      UC Treatments / Results  Labs (all labs ordered are listed, but only abnormal results are displayed) Labs Reviewed - No data to display  EKG   Radiology DG Shoulder Right  Result Date: 03/16/2020 CLINICAL DATA:  Fall EXAM: RIGHT SHOULDER - 2+ VIEW COMPARISON:  None. FINDINGS: AC  joint is intact. Acute comminuted fracture involving the right humeral neck and greater tuberosity with displaced greater tuberosity fracture fragments. Humeral head appears to project over  glenoid. Slight inferior migration of the humeral head may be due to positioning or possible effusion. Moderate glenohumeral degenerative change with questionable small fracture fragments at the inferior glenoid. IMPRESSION: Acute comminuted and displaced fracture involving the right humeral neck and greater tuberosity. Slight inferior migration of the humeral head may be due to positioning or possible effusion. Questionable small fracture fragments adjacent to the inferior glenoid Electronically Signed   By: Jasmine Pang M.D.   On: 03/16/2020 19:08   DG Humerus Right  Result Date: 03/16/2020 CLINICAL DATA:  Pain EXAM: RIGHT HUMERUS - 2+ VIEW COMPARISON:  None. FINDINGS: There is an acute, comminuted fracture of the proximal right humerus. The fracture appears to be impacted likely through the surgical neck and involves the greater tuberosity. There are advanced degenerative changes of the right glenohumeral joint with inferior subluxation of the humeral head. No definite evidence for frank dislocation. IMPRESSION: 1. Acute, comminuted fracture of the proximal right humerus. 2. Advanced degenerative changes of the right glenohumeral joint with inferior subluxation of the humeral head. Electronically Signed   By: Katherine Mantle M.D.   On: 03/16/2020 19:07    Procedures Procedures (including critical care time)  Medications Ordered in UC Medications - No data to display  Initial Impression / Assessment and Plan / UC Course  I have reviewed the triage vital signs and the nursing notes.  Pertinent labs & imaging results that were available during my care of the patient were reviewed by me and considered in my medical decision making (see chart for details).     May use ice and tylenol as needed for pain    Will need to follow up with ortho with in the next few weeks  If pain becomes worse may need to re xray  Keep splint on and elevated to help with swelling  Final Clinical Impressions(s) / UC Diagnoses   Final diagnoses:  None   Discharge Instructions   None    ED Prescriptions    None     PDMP not reviewed this encounter.   Coralyn Mark, NP 03/21/20 1134

## 2020-03-30 DIAGNOSIS — S42201A Unspecified fracture of upper end of right humerus, initial encounter for closed fracture: Secondary | ICD-10-CM | POA: Insufficient documentation

## 2020-04-02 ENCOUNTER — Encounter (INDEPENDENT_AMBULATORY_CARE_PROVIDER_SITE_OTHER): Payer: Medicare Other | Admitting: Ophthalmology

## 2020-05-05 ENCOUNTER — Ambulatory Visit (INDEPENDENT_AMBULATORY_CARE_PROVIDER_SITE_OTHER): Payer: Medicare Other | Admitting: Ophthalmology

## 2020-05-05 ENCOUNTER — Other Ambulatory Visit: Payer: Self-pay

## 2020-05-05 ENCOUNTER — Encounter (INDEPENDENT_AMBULATORY_CARE_PROVIDER_SITE_OTHER): Payer: Self-pay | Admitting: Ophthalmology

## 2020-05-05 DIAGNOSIS — E113512 Type 2 diabetes mellitus with proliferative diabetic retinopathy with macular edema, left eye: Secondary | ICD-10-CM

## 2020-05-05 DIAGNOSIS — H43822 Vitreomacular adhesion, left eye: Secondary | ICD-10-CM | POA: Diagnosis not present

## 2020-05-05 DIAGNOSIS — H35372 Puckering of macula, left eye: Secondary | ICD-10-CM | POA: Diagnosis not present

## 2020-05-05 DIAGNOSIS — E113511 Type 2 diabetes mellitus with proliferative diabetic retinopathy with macular edema, right eye: Secondary | ICD-10-CM

## 2020-05-05 MED ORDER — BEVACIZUMAB 2.5 MG/0.1ML IZ SOSY
2.5000 mg | PREFILLED_SYRINGE | INTRAVITREAL | Status: AC | PRN
  Administered 2020-05-05: 2.5 mg via INTRAVITREAL

## 2020-05-05 NOTE — Assessment & Plan Note (Signed)
Not visually impactful at this time.  We will continue to monitor

## 2020-05-05 NOTE — Assessment & Plan Note (Addendum)
Minor OS, stable,  no impact on acuity

## 2020-05-05 NOTE — Assessment & Plan Note (Signed)
Noncenter involved CSME, improved some 2 months post focal laser treatment we will continue to observe

## 2020-05-05 NOTE — Assessment & Plan Note (Signed)
Center involved CSME, improved and stable currently 11-week interval.  We will continue to treat and extend interval follow-up now to 3 months

## 2020-05-05 NOTE — Progress Notes (Signed)
05/05/2020     CHIEF COMPLAINT Patient presents for Retina Follow Up (12 Week PDR f\u OS. Possible Avastin OS. OCT/Pt states vision has been stable. Denies any changes. Pt missed last appt due to a fall. Using gtts as directed/BGL: did not check)   HISTORY OF PRESENT ILLNESS: Ann Gomez is a 70 y.o. female who presents to the clinic today for:   HPI    Retina Follow Up    Patient presents with  Diabetic Retinopathy.  In left eye.  Severity is moderate.  Duration of 12 weeks.  Since onset it is stable.  I, the attending physician,  performed the HPI with the patient and updated documentation appropriately. Additional comments: 12 Week PDR f\u OS. Possible Avastin OS. OCT Pt states vision has been stable. Denies any changes. Pt missed last appt due to a fall. Using gtts as directed BGL: did not check       Last edited by Elyse Jarvis on 05/05/2020  9:05 AM. (History)      Referring physician: Sherral Hammers, FNP [address] [place],  [place] [address]  HISTORICAL INFORMATION:   Selected notes from the MEDICAL RECORD NUMBER    Lab Results  Component Value Date   HGBA1C 9.6 (H) 06/08/2009     CURRENT MEDICATIONS: Current Outpatient Medications (Ophthalmic Drugs)  Medication Sig  . dorzolamide-timolol (COSOPT) 22.3-6.8 MG/ML ophthalmic solution 1 drop 2 (two) times daily.  Marland Kitchen latanoprost (XALATAN) 0.005 % ophthalmic solution 1 drop daily.   No current facility-administered medications for this visit. (Ophthalmic Drugs)   Current Outpatient Medications (Other)  Medication Sig  . amLODipine (NORVASC) 10 MG tablet Take 10 mg by mouth daily.  Marland Kitchen aspirin EC 81 MG tablet Take 1 tablet (81 mg total) by mouth daily.  . benazepril (LOTENSIN) 40 MG tablet Take 1 tablet (40 mg total) by mouth daily.  . furosemide (LASIX) 40 MG tablet Take 1 tablet (40 mg total) by mouth daily.  . insulin glargine (LANTUS) 100 UNIT/ML injection Inject 30 Units into the skin every  morning. 25 units in the pm  . loperamide (IMODIUM) 2 MG capsule Take 1 capsule (2 mg total) by mouth 2 (two) times daily as needed for diarrhea or loose stools.  Marland Kitchen losartan (COZAAR) 100 MG tablet Take 100 mg by mouth daily.  . metFORMIN (GLUCOPHAGE) 1000 MG tablet Take 1 tablet (1,000 mg total) by mouth 2 (two) times daily with a meal.  . metoprolol (LOPRESSOR) 50 MG tablet Take 1 tablet (50 mg total) by mouth 2 (two) times daily.  . ondansetron (ZOFRAN) 4 MG tablet Take 1 tablet (4 mg total) by mouth every 8 (eight) hours as needed for nausea or vomiting.  . pravastatin (PRAVACHOL) 20 MG tablet Take 20 mg by mouth at bedtime.  Marland Kitchen terazosin (HYTRIN) 1 MG capsule Take 2 capsules (2 mg total) by mouth at bedtime.   No current facility-administered medications for this visit. (Other)      REVIEW OF SYSTEMS: ROS    Positive for: Endocrine   Last edited by Elyse Jarvis on 05/05/2020  8:58 AM. (History)       ALLERGIES Allergies  Allergen Reactions  . Hydrochlorothiazide Other (See Comments)    dizzy    PAST MEDICAL HISTORY Past Medical History:  Diagnosis Date  . Diabetes mellitus   . Edema   . Essential hypertension, benign   . Hypertension   . Obesity, unspecified    Past Surgical History:  Procedure Laterality Date  . CESAREAN SECTION      FAMILY HISTORY Family History  Family history unknown: Yes    SOCIAL HISTORY Social History   Tobacco Use  . Smoking status: Never Smoker  . Smokeless tobacco: Never Used  Substance Use Topics  . Alcohol use: No  . Drug use: No         OPHTHALMIC EXAM:  Base Eye Exam    Visual Acuity (Snellen - Linear)      Right Left   Dist [place] 20/25 -1 20/40 -1   Dist ph [place]  20/40 +2       Tonometry (Tonopen, 9:05 AM)      Right Left   Pressure 17 24       Pupils      Pupils Dark Light Shape React APD   Right PERRL 3 3 Round Minimal None   Left PERRL 3 3 Round Minimal None       Visual Fields (Counting fingers)       Left Right    Full Full       Neuro/Psych    Mood/Affect: Normal       Dilation    Left eye: 1.0% Mydriacyl, 2.5% Phenylephrine @ 9:05 AM        Slit Lamp and Fundus Exam    External Exam      Right Left   External Normal Normal       Slit Lamp Exam      Right Left   Lids/Lashes Normal Normal   Conjunctiva/Sclera White and quiet White and quiet   Cornea Clear Clear   Anterior Chamber Deep and quiet Deep and quiet   Iris Round and reactive Round and reactive   Anterior Vitreous Normal Normal       Fundus Exam      Right Left   Posterior Vitreous  Normal   Disc  Neovascularization   C/D Ratio  0.45   Macula  Microaneurysms, Mild clinically significant macular edema   Vessels  PDR-active   Periphery  Dot-blot hemorrhage          IMAGING AND PROCEDURES  Imaging and Procedures for 05/05/20  OCT, Retina - OU - Both Eyes       Right Eye Quality was good. Scan locations included subfoveal. Progression has improved. Findings include abnormal foveal contour.   Left Eye Quality was good. Scan locations included subfoveal.   Notes Focal CSME OD superior to the fovea has improved slightly post recent focal laser November 2021.  OS with juxta foveal and center involved CSME Stable at current interval postinjection intravitreal Avastin, 11 weeks previous.         Intravitreal Injection, Pharmacologic Agent - OS - Left Eye       Time Out 05/05/2020. 9:36 AM. Confirmed correct patient, procedure, site, and patient consented.   Anesthesia Topical anesthesia was used. Anesthetic medications included Akten 3.5%.   Procedure Preparation included Tobramycin 0.3%, 10% betadine to eyelids, 5% betadine to ocular surface. A supplied needle was used.   Injection:  2.5 mg Bevacizumab (AVASTIN) 2.5mg /0.63mL SOSY   NDC: 47096-283-66, Lot: [id number]   Route: Intravitreal, Site: Left Eye  Post-op Post injection exam found visual acuity of at least counting fingers.  The patient tolerated the procedure well. There were no complications. The patient received written and verbal post procedure care education. Post injection medications were not given.  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  We will continue to treat and extend interval follow-up now to 3 months  Vitreomacular adhesion of left eye Not visually impactful at this time.  We will continue to monitor  Macular pucker, left eye Minor OS, stable,  no impact on acuity      ICD-10-CM   1. 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 Vitreomacular adhesion of left eye  H43.822   4. Macular pucker, left eye  H35.372     1.  2.  3.  Ophthalmic Meds Ordered this visit:  Meds ordered this encounter  Medications  . bevacizumab (AVASTIN) SOSY 2.5 mg       Return in about 3 months (around 08/03/2020) for DILATE OU, AVASTIN OCT, OS.  There are no Patient Instructions on file for this visit.   Explained the diagnoses, plan, and follow up with the patient and they expressed understanding.  Patient expressed understanding of the importance of proper follow up care.   Alford Highland Evelette Hollern M.D. Diseases & Surgery of the Retina and Vit[facility]ous [facility] 05/05/20     Abbreviations: M myopia (nearsighted); A astigmatism; H hyperopia (farsighted); P presbyopia; Mrx spectacle prescription;  CTL contact lenses; OD right eye; OS left eye; OU both eyes  XT exotropia; ET esotropia; PEK punctate epithelial keratitis; PEE punctate epithelial erosions; DES dry eye syndrome; MGD meibomian gland dysfunction; ATs artificial tears; PFAT's preservative free artificial tears; NSC nuclear sclerotic cataract; PSC posterior subcapsular cataract; ERM epi-retinal membrane; PVD posterior vitreous detachment; RD retinal detachment; DM diabetes mellitus; DR diabetic retinopathy; NPDR non-proliferative diabetic retinopathy; PDR proliferative diabetic retinopathy; CSME clinically significant macular edema; DME diabetic macular edema; dbh dot blot hemorrhages; CWS cotton wool spot; POAG primary open angle glaucoma; C/D cup-to-disc ratio; HVF humphrey visual field; GVF goldmann visual field; OCT optical coherence tomography; IOP intraocular pressure; BRVO Branch retinal vein occlusion; CRVO central retinal vein occlusion; CRAO central retinal artery occlusion; BRAO branch retinal artery occlusion; RT retinal tear; SB scleral buckle; PPV pars plana vitrectomy; VH Vitreous hemorrhage; PRP panretinal laser photocoagulation; IVK intravitreal kenalog; VMT vitreomacular traction; MH Macular hole;  NVD neovascularization of the disc; NVE neovascularization elsewhere; AREDS age related eye disease study; ARMD age related macular degeneration; POAG primary open angle glaucoma; EBMD epithelial/anterior basement membrane dystrophy; ACIOL anterior chamber intraocular lens; IOL intraocular lens; PCIOL posterior chamber intraocular lens; Phaco/IOL phacoemulsification with intraocular lens placement; PRK photorefractive keratectomy; LASIK laser assisted in situ keratomileusis; HTN hypertension; DM diabetes mellitus; COPD chronic obstructive pulmonary disease

## 2020-07-01 ENCOUNTER — Emergency Department (HOSPITAL_COMMUNITY): Payer: Medicare Other

## 2020-07-01 ENCOUNTER — Emergency Department (HOSPITAL_COMMUNITY)
Admission: EM | Admit: 2020-07-01 | Discharge: 2020-07-01 | Disposition: A | Discharge status: Home / Self Care | Payer: Medicare Other | Attending: Emergency Medicine | Admitting: Emergency Medicine

## 2020-07-01 ENCOUNTER — Encounter (HOSPITAL_COMMUNITY): Payer: Self-pay

## 2020-07-01 DIAGNOSIS — W19XXXA Unspecified fall, initial encounter: Secondary | ICD-10-CM | POA: Insufficient documentation

## 2020-07-01 DIAGNOSIS — Z794 Long term (current) use of insulin: Secondary | ICD-10-CM | POA: Insufficient documentation

## 2020-07-01 DIAGNOSIS — I10 Essential (primary) hypertension: Secondary | ICD-10-CM | POA: Diagnosis not present

## 2020-07-01 DIAGNOSIS — R55 Syncope and collapse: Secondary | ICD-10-CM | POA: Insufficient documentation

## 2020-07-01 DIAGNOSIS — Z7982 Long term (current) use of aspirin: Secondary | ICD-10-CM | POA: Insufficient documentation

## 2020-07-01 DIAGNOSIS — E113513 Type 2 diabetes mellitus with proliferative diabetic retinopathy with macular edema, bilateral: Secondary | ICD-10-CM | POA: Insufficient documentation

## 2020-07-01 DIAGNOSIS — Z7984 Long term (current) use of oral hypoglycemic drugs: Secondary | ICD-10-CM | POA: Insufficient documentation

## 2020-07-01 DIAGNOSIS — Z79899 Other long term (current) drug therapy: Secondary | ICD-10-CM | POA: Insufficient documentation

## 2020-07-01 DIAGNOSIS — Y990 Civilian activity done for income or pay: Secondary | ICD-10-CM | POA: Insufficient documentation

## 2020-07-01 DIAGNOSIS — R739 Hyperglycemia, unspecified: Secondary | ICD-10-CM

## 2020-07-01 DIAGNOSIS — E1165 Type 2 diabetes mellitus with hyperglycemia: Secondary | ICD-10-CM | POA: Insufficient documentation

## 2020-07-01 LAB — BASIC METABOLIC PANEL
Anion gap: 6 (ref 5–15)
BUN: 15 mg/dL (ref 8–23)
CO2: 22 mmol/L (ref 22–32)
Calcium: 9.2 mg/dL (ref 8.9–10.3)
Chloride: 105 mmol/L (ref 98–111)
Creatinine, Ser: 0.95 mg/dL (ref 0.44–1.00)
GFR, Estimated: >60 mL/min (ref >60)
Glucose, Bld: 260 mg/dL — ABNORMAL HIGH (ref 70–99)
Potassium: 4.2 mmol/L (ref 3.5–5.1)
Sodium: 133 mmol/L — ABNORMAL LOW (ref 135–145)

## 2020-07-01 LAB — CBC WITH DIFFERENTIAL/PLATELET
Abs Immature Granulocytes: 0.01 10*3/uL (ref 0.00–0.07)
Basophils Absolute: 0 10*3/uL (ref 0.0–0.1)
Basophils Relative: 0 %
Eosinophils Absolute: 0.1 10*3/uL (ref 0.0–0.5)
Eosinophils Relative: 2 %
HCT: 36.8 % (ref 36.0–46.0)
Hemoglobin: 11.7 g/dL — ABNORMAL LOW (ref 12.0–15.0)
Immature Granulocytes: 0 %
Lymphocytes Relative: 32 %
Lymphs Abs: 1.4 10*3/uL (ref 0.7–4.0)
MCH: 27.3 pg (ref 26.0–34.0)
MCHC: 31.8 g/dL (ref 30.0–36.0)
MCV: 85.8 fL (ref 80.0–100.0)
Monocytes Absolute: 0.5 10*3/uL (ref 0.1–1.0)
Monocytes Relative: 11 %
Neutro Abs: 2.5 10*3/uL (ref 1.7–7.7)
Neutrophils Relative %: 55 %
Platelets: 221 10*3/uL (ref 150–400)
RBC: 4.29 MIL/uL (ref 3.87–5.11)
RDW: 13.6 % (ref 11.5–15.5)
WBC: 4.5 10*3/uL (ref 4.0–10.5)
nRBC: 0 % (ref 0.0–0.2)

## 2020-07-01 LAB — CBG MONITORING, ED
Glucose-Capillary: 248 mg/dL — ABNORMAL HIGH (ref 70–99)
Glucose-Capillary: 193 mg/dL — ABNORMAL HIGH (ref 70–99)

## 2020-07-01 MED ORDER — SODIUM CHLORIDE 0.9 % IV BOLUS
500.0000 mL | Freq: Once | INTRAVENOUS | Status: AC
  Administered 2020-07-01: 500 mL via INTRAVENOUS

## 2020-07-01 NOTE — ED Triage Notes (Signed)
Pt BIB [facility] d/t an unwitnessed fall at her work. She was found on the floor very confused. EMS reports she does not remember the fall, denies HA or blurred vision, her speech is clear, CBG was 420 and then 350 when checked again. BP was 230/120 & was irregular and A-Fib on the monitor with noted PVC's.

## 2020-07-01 NOTE — Discharge Instructions (Addendum)
Your blood sugar was elevated today.  Please continue take your blood sugar medications such as insulin.  Please follow-up closely with your primary care doctor.  Your blood pressure was also elevated today.  Your work-up today was reassuring.  I strongly recommend calling your primary care doctor's office either right this morning to schedule appointment within the next week or tomorrow morning first thing.

## 2020-07-01 NOTE — ED Provider Notes (Signed)
[facility] Provider Note   CSN: [id number] Arrival date & time: 07/01/20  1348     History Chief Complaint  Patient presents with  . Fall    Ann Gomez is a 70 y.o. female.  HPI Patient is a 70 year old female presented today with EMS after a witnessed fall at work.  She was found on the ground seemed somewhat dazed after her fall but was apparently at her mental baseline ANO x3 and found.  Per EMS she does not recall the fall or the events of the fall.  At time of my evaluation patient states that she continues to not recall the fall.  She states that she does not member having any chest pain, abdominal pain, lightheadedness, dizziness or any other symptoms but woke up on the floor feeling somewhat tired but not unwell.  She denies any current pain.  Per EMS she was in an irregular rhythm which their electronic EKG read interpreted as atrial fibrillation.  She has no history of this.  She denies any other symptoms presently other than feeling slightly fatigued.  She denies any headache, chest pain or shortness of breath.      Past Medical History:  Diagnosis Date  . Diabetes mellitus   . Edema   . Essential hypertension, benign   . Hypertension   . Obesity, unspecified     Patient Active Problem List   Diagnosis Date Noted  . Vitreomacular adhesion of left eye 02/13/2020  . Macular pucker, left eye 02/13/2020  . 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 Proliferative diabetic retinopathy of left eye with macular edema associated with type 2 diabetes mellitus (HCC) 01/08/2020  . Primary open angle glaucoma of both eyes, moderate stage 01/08/2020  . Essential hypertension, benign   . Edema   . DM 10/09/2006  . DYSLIPIDEMIA 10/09/2006  . OBESITY NOS 10/09/2006  . ANEMIA, IRON DEFICIENCY NOS 10/09/2006  . BELL'S PALSY 10/09/2006  .  HYPERTENSION, ESSENTIAL NOS 10/09/2006    Past Surgical History:  Procedure Laterality Date  . CESAREAN SECTION       OB History   No obstetric history on file.     Family History  Family history unknown: Yes    Social History   Tobacco Use  . Smoking status: Never Smoker  . Smokeless tobacco: Never Used  Substance Use Topics  . Alcohol use: No  . Drug use: No    Home Medications Prior to Admission medications   Medication Sig Start Date End Date Taking? Authorizing Provider  amLODipine (NORVASC) 10 MG tablet Take 10 mg by mouth daily. 11/28/19   [provider]  aspirin EC 81 MG tablet Take 1 tablet (81 mg total) by mouth daily. 04/16/12   Ghimire, Werner LeanShanker M, MD  benazepril (LOTENSIN) 40 MG tablet Take 1 tablet (40 mg total) by mouth daily. 04/16/12   Ghimire, Werner LeanShanker M, MD  dorzolamide-timolol (COSOPT) 22.3-6.8 MG/ML ophthalmic solution 1 drop 2 (two) times daily. 11/28/19   [provider]  furosemide (LASIX) 40 MG tablet Take 1 tablet (40 mg total) by mouth daily. 04/16/12   Ghimire, Werner LeanShanker M, MD  insulin glargine (LANTUS) 100 UNIT/ML injection Inject 30 Units into the skin every morning. 25 units in the pm    [provider]  latanoprost (XALATAN) 0.005 % ophthalmic solution 1 drop daily. 12/17/19   [provider]  loperamide (IMODIUM) 2  MG capsule Take 1 capsule (2 mg total) by mouth 2 (two) times daily as needed for diarrhea or loose stools. 07/12/15   Mackuen, Courteney Lyn, MD  losartan (COZAAR) 100 MG tablet Take 100 mg by mouth daily. 11/28/19   [provider]  metFORMIN (GLUCOPHAGE) 1000 MG tablet Take 1 tablet (1,000 mg total) by mouth 2 (two) times daily with a meal. 04/16/12   Ghimire, Werner Lean, MD  metoprolol (LOPRESSOR) 50 MG tablet Take 1 tablet (50 mg total) by mouth 2 (two) times daily. 04/16/12   Ghimire, Werner Lean, MD  ondansetron (ZOFRAN) 4 MG tablet Take 1 tablet (4 mg total) by mouth every 8 (eight) hours as  needed for nausea or vomiting. 07/12/15   Mackuen, Courteney Lyn, MD  pravastatin (PRAVACHOL) 20 MG tablet Take 20 mg by mouth at bedtime. 11/28/19   [provider]  terazosin (HYTRIN) 1 MG capsule Take 2 capsules (2 mg total) by mouth at bedtime. 04/16/12   Ghimire, Werner Lean, MD  glipiZIDE (GLUCOTROL XL) 10 MG 24 hr tablet Take 10 mg by mouth daily.  04/16/12  [provider]  metoprolol succinate (TOPROL-XL) 100 MG 24 hr tablet Take 100 mg by mouth daily. Take with or immediately following a meal.  04/16/12  [provider]  rosuvastatin (CRESTOR) 5 MG tablet Take 5 mg by mouth daily.  04/16/12  [provider]    Allergies    Hydrochlorothiazide  Review of Systems   Review of Systems  Constitutional: Negative for chills and fever.  HENT: Negative for congestion.   Eyes: Negative for pain.  Respiratory: Negative for cough and shortness of breath.   Cardiovascular: Negative for chest pain and leg swelling.  Gastrointestinal: Negative for abdominal pain and vomiting.  Genitourinary: Negative for dysuria.  Musculoskeletal: Negative for myalgias.  Skin: Negative for rash.  Neurological: Positive for syncope (does not recall). Negative for dizziness and headaches.    Physical Exam Updated Vital Signs BP (!) 184/99   Pulse 87   Temp 98.1 F (36.7 C)   Resp (!) 21   SpO2 100%   Physical Exam Vitals and nursing note reviewed.  Constitutional:      General: She is not in acute distress. HENT:     Nose: Nose normal.  Eyes:     General: No scleral icterus. Cardiovascular:     Rate and Rhythm: Normal rate and regular rhythm.     Pulses: Normal pulses.     Heart sounds: Normal heart sounds.  Pulmonary:     Effort: Pulmonary effort is normal. No respiratory distress.     Breath sounds: No wheezing.  Abdominal:     Palpations: Abdomen is soft.     Tenderness: There is no abdominal tenderness.  Musculoskeletal:     Cervical back: Normal range  of motion.     Right lower leg: No edema.     Left lower leg: No edema.     Comments: No bony tenderness over joints or long bones of the upper and lower extremities.     No neck or back midline tenderness, step-off, deformity, or bruising. Able to turn head left and right 45 degrees without difficulty.  Full range of motion of upper and lower extremity joints shown after palpation was conducted; with 5/5 symmetrical strength in upper and lower extremities. No chest wall tenderness, no facial or cranial tenderness.   Patient has intact sensation grossly in lower and upper extremities. Intact patellar and ankle reflexes. Patient  able to ambulate without difficulty.  Radial and DP pulses palpated BL.   Skin:    General: Skin is warm and dry.     Capillary Refill: Capillary refill takes less than 2 seconds.  Neurological:     Mental Status: She is alert. Mental status is at baseline.  Psychiatric:        Mood and Affect: Mood normal.        Behavior: Behavior normal.     ED Results / Procedures / Treatments   Labs (all labs ordered are listed, but only abnormal results are displayed) Labs Reviewed  BASIC METABOLIC PANEL - Abnormal; Notable for the following components:      Result Value   Sodium 133 (*)    Glucose, Bld 260 (*)    All other components within normal limits  CBC WITH DIFFERENTIAL/PLATELET - Abnormal; Notable for the following components:   Hemoglobin 11.7 (*)    All other components within normal limits  CBG MONITORING, ED - Abnormal; Notable for the following components:   Glucose-Capillary 248 (*)    All other components within normal limits  CBG MONITORING, ED - Abnormal; Notable for the following components:   Glucose-Capillary 193 (*)    All other components within normal limits    EKG EKG Interpretation  Date/Time:  Wednesday July 01 2020 13:58:38 EST Ventricular Rate:  90 PR Interval:    QRS Duration: 101 QT Interval:  363 QTC Calculation: 445 R  Axis:   33 Text Interpretation: Sinus rhythm Multiple ventricular premature complexes ST-t wave abnormality Artifact Abnormal ECG Confirmed by Gerhard Munch [phone]) on 07/01/2020 2:09:04 PM   Radiology CT Head Wo Contrast  Result Date: 07/01/2020 CLINICAL DATA:  Syncope EXAM: CT HEAD WITHOUT CONTRAST TECHNIQUE: Contiguous axial images were obtained from the base of the skull through the vertex without intravenous contrast. COMPARISON:  None. FINDINGS: Brain: There is no acute intracranial hemorrhage, mass effect, or edema. Gray-white differentiation is preserved. There is no extra-axial fluid collection. Ventricles and sulci are within normal limits in size and configuration. Minimal hypoattenuation in the supratentorial white matter is nonspecific but may reflect minor chronic microvascular ischemic changes. Vascular: There is atherosclerotic calcification at the skull base. Skull: Calvarium is unremarkable. Sinuses/Orbits: No acute finding. Other: None. IMPRESSION: No acute intracranial abnormality. Electronically Signed   By: Guadlupe Spanish M.D.   On: 07/01/2020 15:21   DG Chest Portable 1 View  Result Date: 07/01/2020 CLINICAL DATA:  Syncope EXAM: PORTABLE CHEST 1 VIEW COMPARISON:  None. FINDINGS: Minimal atelectasis at the left lung base. No pleural effusion. Top-normal heart size. Sequelae of prior right humerus fracture. IMPRESSION: Minimal atelectasis at the left lung base. Electronically Signed   By: Guadlupe Spanish M.D.   On: 07/01/2020 15:12    Procedures Procedures   Medications Ordered in ED Medications  sodium chloride 0.9 % bolus 500 mL (0 mLs Intravenous Stopped 07/01/20 1609)    ED Course  I have reviewed the triage vital signs and the nursing notes.  Pertinent labs & imaging results that were available during my care of the patient were reviewed by me and considered in my medical decision making (see chart for details).  Patient here after fall that occurred earlier today. It  appeared that she syncopized.  She has no significant history of cardiac disease or sudden cardiac death in her family.  She had no prodromal symptoms and has no complaints at this time.  She states she feels well.  Patient is  notably hyperglycemic but otherwise lab work is reassuring today.  Vital signs notable for hypertension.  She will follow up with her PCP for recheck of blood pressure and additional medications.  The cardiac monitor revealed NSR as interpreted by me.  The cardiac monitor was ordered secondary to the patient's history of syncope and to monitor the patient for dysrhythmia.   Clinical Course as of 07/02/20 1348  Wed Jul 01, 2020  1548 Sodium(!): 133 Sodium corrects to normal with hyperglycemia. [WF]  1548 CBC with Differential/Platelet(!) CBC without leukocytosis or anemia [WF]  1548 CT head without abnormality, chest x-ray without abnormality [WF]    Clinical Course User Index [WF] Gailen Shelter, PA   MDM Rules/Calculators/A&P                          I discussed this case with my attending physician who cosigned this note including patient's presenting symptoms, physical exam, and planned diagnostics and interventions. Attending physician stated agreement with plan or made changes to plan which were implemented.   Attending physician assessed patient at bedside.   Notably there was an episode recorded on pulse oximeter with a heart rate of 33 however I correlated the time stamp on this pulse oximeter reading with patient's cardiac monitor and her heart rate was greater than 60.  Patient discharge at this time.  Close return precautions given and follow-up instructions were given as well.  Patient is agreeable to plan. Has been monitored on telemetry without any abnormalities during her stay here.   Final Clinical Impression(s) / ED Diagnoses Final diagnoses:  Fall, initial encounter  Syncope, unspecified syncope type  Hyperglycemia    Rx / DC  Orders ED Discharge Orders    None       Gailen Shelter, [place] 07/02/20 2143    Gerhard Munch, MD 07/03/20 2250

## 2020-08-03 ENCOUNTER — Encounter (INDEPENDENT_AMBULATORY_CARE_PROVIDER_SITE_OTHER): Payer: Medicare Other | Admitting: Ophthalmology

## 2020-08-04 ENCOUNTER — Ambulatory Visit: Payer: Medicare Other | Admitting: Podiatry

## 2020-08-25 ENCOUNTER — Ambulatory Visit: Payer: Medicare Other | Admitting: Podiatry

## 2020-09-01 ENCOUNTER — Ambulatory Visit: Payer: Medicare Other | Admitting: Podiatry

## 2020-09-08 ENCOUNTER — Other Ambulatory Visit: Payer: Self-pay

## 2020-09-08 ENCOUNTER — Ambulatory Visit: Payer: Medicare Other | Admitting: Podiatry

## 2020-09-08 ENCOUNTER — Encounter: Payer: Self-pay | Admitting: Podiatry

## 2020-09-08 DIAGNOSIS — R52 Pain, unspecified: Secondary | ICD-10-CM | POA: Diagnosis not present

## 2020-09-08 DIAGNOSIS — M79674 Pain in right toe(s): Secondary | ICD-10-CM

## 2020-09-08 DIAGNOSIS — M79675 Pain in left toe(s): Secondary | ICD-10-CM

## 2020-09-08 DIAGNOSIS — L84 Corns and callosities: Secondary | ICD-10-CM

## 2020-09-08 DIAGNOSIS — B351 Tinea unguium: Secondary | ICD-10-CM

## 2020-09-08 DIAGNOSIS — E1142 Type 2 diabetes mellitus with diabetic polyneuropathy: Secondary | ICD-10-CM | POA: Diagnosis not present

## 2020-09-08 NOTE — Progress Notes (Signed)
  Subjective:  Patient ID: Ann Gomez, female    DOB: March 26, 1951,  MRN: [id number]  Chief Complaint  Patient presents with  . Diabetes      np - diabetic foot care, A1C  9.6    70 y.o. female presents with the above complaint. History confirmed with patient.  Her A1c is 9.6 she has been working it down.  She has significant swelling in both feet.  Her nails are thickened elongated and painful.  She is corns on both fifth toes  Objective:  Physical Exam: warm, good capillary refill, no trophic changes or ulcerative lesions and normal DP and PT pulses.  Loss of protective sensation and abnormal sensation.  Onychomycosis x10 with thickened elongated yellow nail plates with subungual debris and hyperkeratosis bilateral fifth PIPJ Assessment:   1. Pain due to onychomycosis of toenails of both feet   2. Type 2 diabetes mellitus with polyneuropathy (HCC)   3. Callus of foot   4. Pain      Plan:  Patient was evaluated and treated and all questions answered.  Patient educated on diabetes. Discussed proper diabetic foot care and discussed risks and complications of disease. Educated patient in depth on reasons to return to the office immediately should he/she discover anything concerning or new on the feet. All questions answered. Discussed proper shoes as well.   Discussed the etiology and treatment options for the condition in detail with the patient. Educated patient on the topical and oral treatment options for mycotic nails. Recommended debridement of the nails today. Sharp and mechanical debridement performed of all painful and mycotic nails today. Nails debrided in length and thickness using a nail nipper to level of comfort. Discussed treatment options including appropriate shoe gear. Follow up as needed for painful nails.  All symptomatic hyperkeratoses were safely debrided with a sterile #15 blade to patient's level of comfort without incident. We discussed preventative and  palliative care of these lesions including supportive and accommodative shoegear, padding, prefabricated and custom molded accommodative orthoses, use of a pumice stone and lotions/creams daily.   Return in about 3 months (around 12/09/2020) for at risk diabetic foot care.

## 2020-09-17 ENCOUNTER — Encounter (INDEPENDENT_AMBULATORY_CARE_PROVIDER_SITE_OTHER): Payer: Self-pay | Admitting: Ophthalmology

## 2020-09-17 ENCOUNTER — Other Ambulatory Visit: Payer: Self-pay

## 2020-09-17 ENCOUNTER — Ambulatory Visit (INDEPENDENT_AMBULATORY_CARE_PROVIDER_SITE_OTHER): Payer: Medicare Other | Admitting: Ophthalmology

## 2020-09-17 DIAGNOSIS — H35031 Hypertensive retinopathy, right eye: Secondary | ICD-10-CM

## 2020-09-17 DIAGNOSIS — E113511 Type 2 diabetes mellitus with proliferative diabetic retinopathy with macular edema, right eye: Secondary | ICD-10-CM | POA: Diagnosis not present

## 2020-09-17 DIAGNOSIS — H35372 Puckering of macula, left eye: Secondary | ICD-10-CM

## 2020-09-17 DIAGNOSIS — H43822 Vitreomacular adhesion, left eye: Secondary | ICD-10-CM | POA: Diagnosis not present

## 2020-09-17 DIAGNOSIS — E113512 Type 2 diabetes mellitus with proliferative diabetic retinopathy with macular edema, left eye: Secondary | ICD-10-CM

## 2020-09-17 MED ORDER — BEVACIZUMAB 2.5 MG/0.1ML IZ SOSY
2.5000 mg | PREFILLED_SYRINGE | INTRAVITREAL | Status: AC | PRN
  Administered 2020-09-17: 2.5 mg via INTRAVITREAL

## 2020-09-17 NOTE — Assessment & Plan Note (Signed)
Recommend improved blood pressure monitoring and control

## 2020-09-17 NOTE — Assessment & Plan Note (Signed)
Minor no impact on acuity 

## 2020-09-17 NOTE — Assessment & Plan Note (Signed)
Minor, not a major component of CME left eye

## 2020-09-17 NOTE — Progress Notes (Signed)
09/17/2020     CHIEF COMPLAINT Patient presents for Retina Follow Up (4 month fu ou and Avastin OS /Pt states, "My va is doing good but I do have trouble sometime with feeling like I have sand in my eyes."/Pt reports using Cosopt BID OU and Latanoprost QHS OU/A1C: unknown/LBS: 148)   HISTORY OF PRESENT ILLNESS: Ann Gomez is a 70 y.o. female who presents to the clinic today for:   HPI    Retina Follow Up    Diagnosis: Diabetic Retinopathy   Laterality: both eyes   Onset: 4 months ago   Severity: mild   Duration: 4 months   Course: stable   Comments: 4 month fu ou and Avastin OS  Pt states, "My va is doing good but I do have trouble sometime with feeling like I have sand in my eyes." Pt reports using Cosopt BID OU and Latanoprost QHS OU A1C: unknown LBS: 148       Last edited by Demetrios Loll, COA on 09/17/2020  9:01 AM. (History)      Referring physician: Sherral Hammers, FNP [address] [place],  [place] [address]  HISTORICAL INFORMATION:   Selected notes from the MEDICAL RECORD NUMBER    Lab Results  Component Value Date   HGBA1C 9.6 (H) 06/08/2009     CURRENT MEDICATIONS: Current Outpatient Medications (Ophthalmic Drugs)  Medication Sig  . dorzolamide-timolol (COSOPT) 22.3-6.8 MG/ML ophthalmic solution 1 drop 2 (two) times daily.  Marland Kitchen latanoprost (XALATAN) 0.005 % ophthalmic solution 1 drop daily.   No current facility-administered medications for this visit. (Ophthalmic Drugs)   Current Outpatient Medications (Other)  Medication Sig  . amLODipine (NORVASC) 10 MG tablet Take 10 mg by mouth daily.  Marland Kitchen aspirin EC 81 MG tablet Take 1 tablet (81 mg total) by mouth daily.  . benazepril (LOTENSIN) 40 MG tablet Take 1 tablet (40 mg total) by mouth daily.  . furosemide (LASIX) 40 MG tablet Take 1 tablet (40 mg total) by mouth daily.  . insulin glargine (LANTUS) 100 UNIT/ML injection Inject 30 Units into the skin every morning. 25 units in the pm  .  loperamide (IMODIUM) 2 MG capsule Take 1 capsule (2 mg total) by mouth 2 (two) times daily as needed for diarrhea or loose stools.  Marland Kitchen losartan (COZAAR) 100 MG tablet Take 100 mg by mouth daily.  . metFORMIN (GLUCOPHAGE) 1000 MG tablet Take 1 tablet (1,000 mg total) by mouth 2 (two) times daily with a meal.  . metoprolol (LOPRESSOR) 50 MG tablet Take 1 tablet (50 mg total) by mouth 2 (two) times daily.  . ondansetron (ZOFRAN) 4 MG tablet Take 1 tablet (4 mg total) by mouth every 8 (eight) hours as needed for nausea or vomiting.  . pravastatin (PRAVACHOL) 20 MG tablet Take 20 mg by mouth at bedtime.  Marland Kitchen terazosin (HYTRIN) 1 MG capsule Take 2 capsules (2 mg total) by mouth at bedtime.   No current facility-administered medications for this visit. (Other)      REVIEW OF SYSTEMS:    ALLERGIES Allergies  Allergen Reactions  . Hydrochlorothiazide Other (See Comments)    dizzy    PAST MEDICAL HISTORY Past Medical History:  Diagnosis Date  . Diabetes mellitus   . Edema   . Essential hypertension, benign   . Hypertension   . Obesity, unspecified    Past Surgical History:  Procedure Laterality Date  . CESAREAN SECTION      FAMILY HISTORY Family History  Family history unknown: Yes    SOCIAL HISTORY Social History   Tobacco Use  . Smoking status: Never Smoker  . Smokeless tobacco: Never Used  Substance Use Topics  . Alcohol use: No  . Drug use: No         OPHTHALMIC EXAM:  Base Eye Exam    Visual Acuity (ETDRS)      Right Left   Dist [place] 20/200 -1 20/50   Dist ph [place] 20/30 -2 20/30 +2       Tonometry (Tonopen, 9:06 AM)      Right Left   Pressure 28 22       Tonometry #2 (Tonopen, 9:06 AM)      Right Left   Pressure 28        Pupils      Pupils Dark Light Shape React APD   Right PERRL 3 3 Round Minimal None   Left PERRL 3 3 Round Minimal None       Visual Fields (Counting fingers)      Left Right    Full Full       Extraocular Movement       Right Left    Full Full       Neuro/Psych    Oriented x3: Yes   Mood/Affect: Normal       Dilation    Both eyes:         Slit Lamp and Fundus Exam    External Exam      Right Left   External Normal Normal       Slit Lamp Exam      Right Left   Lids/Lashes Normal Normal   Conjunctiva/Sclera White and quiet White and quiet   Cornea Clear Clear   Anterior Chamber Deep and quiet Deep and quiet   Iris Round and reactive Round and reactive   Lens Centered posterior chamber intraocular lens Centered posterior chamber intraocular lens   Anterior Vitreous Normal Normal       Fundus Exam      Right Left   Posterior Vitreous  Normal   Disc  Neovascularization   C/D Ratio 0.35 0.45   Macula no macular thickening, Intraretinal hemorrhage, Severe clinically significant macular edema, Microaneurysms Microaneurysms, Mild clinically significant macular edema   Vessels NPDR-Severe, and findings of intraretinal hemorrhages diffusely like incomplete central retinal vein occlusion with moderate enlargement of vein PDR-active   Periphery Diffuse intraretinal hemorrhages peripherally Dot-blot hemorrhage          IMAGING AND PROCEDURES  Imaging and Procedures for 09/17/20  OCT, Retina - OU - Both Eyes       Right Eye Quality was good. Scan locations included subfoveal. Central Foveal Thickness: 1053. Progression has worsened. Findings include abnormal foveal contour, cystoid macular edema.   Left Eye Quality was good. Scan locations included subfoveal. Central Foveal Thickness: 298. Findings include vitreomacular adhesion .   Notes .  OD with rather massive CME, CSME new and profound over the last 3 to 4 months.  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         Intravitreal Injection,  Pharmacologic Agent - OS - Left Eye       Time Out 09/17/2020. 9:58 AM. Confirmed correct patient, procedure, site, and patient consented.   Anesthesia Topical anesthesia was used. Anesthetic  medications included Akten 3.5%.   Procedure Preparation included Tobramycin 0.3%, 10% betadine to eyelids, 5% betadine to ocular surface. A supplied needle was used.   Injection:  2.5 mg Bevacizumab (AVASTIN) 2.5mg /0.621mL SOSY   NDC: 40981-191-4771449-091-43, Lot: [id number]   Route: Intravitreal, Site: Left Eye  Post-op Post injection exam found visual acuity of at least counting fingers. The patient tolerated the procedure well. There were no complications. The patient received written and verbal post procedure care education. Post injection medications were not given.                 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  We will repeat injection today to maintain as slight recurrence is now developing.  Will likely need to decrease vegF to maintain and prevent progression of disease in this left eye over the coming weeks to months, will likely deliver peripheral PRP for permanent solution  Vitreomacular adhesion of left eye Minor, not a major component of CME left eye  Macular pucker, left eye Minor no impact on acuity      ICD-10-CM   1. 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 Proliferative diabetic retinopathy of right eye with macular edema associated with type 2 diabetes mellitus (HCC)  H08.6578E11.3511 Intravitreal Injection, Pharmacologic Agent - OS - Left Eye    bevacizumab (AVASTIN) SOSY 2.5 mg  3. 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 Grade 2 hypertensive retinopathy, right  H35.031   5. Vitreomacular adhesion of left eye  H43.822   6. Macular pucker, left eye  H35.372     1.  Repeat injection intravitreal Avastin OS today to maintain and prevent progression of CSME in the midst of hypertension superimposed upon diabetic retinopathy.  2.  New onset vision loss right eye on the basis of rather massive CSME, CME likely superimposed hypertensive retinopathy upon diabetic retinopathy but possibly some component of incomplete CRV O yet to be defined by angiography on examination next  3.  Ophthalmic Meds Ordered this visit:  Meds ordered this encounter  Medications  . bevacizumab (AVASTIN) SOSY 2.5 mg       Return in about 2 weeks (around 10/01/2020) for DILATE OU, OPTOS FFA R/L, COLOR FP, AVASTIN OCT, OD.  There are no Patient Instructions on file for this visit.   Explained the diagnoses, plan, and follow up with the patient and they expressed understanding.  Patient expressed understanding of the importance of proper follow up care.   Alford HighlandGary A. Laurann Mcmorris M.D. Diseases & Surgery of the Retina and Vitreous [facility] 09/17/20     Abbreviations: M myopia (nearsighted); A astigmatism; H hyperopia (farsighted); P presbyopia; Mrx spectacle prescription;  CTL contact lenses; OD right eye; OS left eye; OU both eyes  XT exotropia; ET esotropia;  PEK punctate epithelial keratitis; PEE punctate epithelial erosions; DES dry eye syndrome; MGD meibomian gland  dysfunction; ATs artificial tears; PFAT's preservative free artificial tears; NSC nuclear sclerotic cataract; PSC posterior subcapsular cataract; ERM epi-retinal membrane; PVD posterior vitreous detachment; RD retinal detachment; DM diabetes mellitus; DR diabetic retinopathy; NPDR non-proliferative diabetic retinopathy; PDR proliferative diabetic retinopathy; CSME clinically significant macular edema; DME diabetic macular edema; dbh dot blot hemorrhages; CWS cotton wool spot; POAG primary open angle glaucoma; C/D cup-to-disc ratio; HVF humphrey visual field; GVF goldmann visual field; OCT optical coherence tomography; IOP intraocular pressure; BRVO Branch retinal vein occlusion; CRVO central retinal vein occlusion; CRAO central retinal artery occlusion; BRAO branch retinal artery occlusion; RT retinal tear; SB scleral buckle; PPV pars plana vitrectomy; VH Vitreous hemorrhage; PRP panretinal laser photocoagulation; IVK intravitreal kenalog; VMT vitreomacular traction; MH Macular hole;  NVD neovascularization of the disc; NVE neovascularization elsewhere; AREDS age related eye disease study; ARMD age related macular degeneration; POAG primary open angle glaucoma; EBMD epithelial/anterior basement membrane dystrophy; ACIOL anterior chamber intraocular lens; IOL intraocular lens; PCIOL posterior chamber intraocular lens; Phaco/IOL phacoemulsification with intraocular lens placement; PRK photorefractive keratectomy; LASIK laser assisted in situ keratomileusis; HTN hypertension; DM diabetes mellitus; COPD chronic obstructive pulmonary disease

## 2020-09-17 NOTE — Assessment & Plan Note (Signed)
Massive worsening of CSME possibly of superimposed CRV O OD as patient reports current hypertension not well controlled

## 2020-09-17 NOTE — Assessment & Plan Note (Signed)
CSME OS controlled currently 11-week follow-up post Avastin intravitreal.  We will repeat injection today to maintain as slight recurrence is now developing.  Will likely need to decrease vegF to maintain and prevent progression of disease in this left eye over the coming weeks to months, will likely deliver peripheral PRP for permanent solution

## 2020-10-05 ENCOUNTER — Encounter (INDEPENDENT_AMBULATORY_CARE_PROVIDER_SITE_OTHER): Payer: Self-pay | Admitting: Ophthalmology

## 2020-10-05 ENCOUNTER — Other Ambulatory Visit: Payer: Self-pay

## 2020-10-05 ENCOUNTER — Ambulatory Visit (INDEPENDENT_AMBULATORY_CARE_PROVIDER_SITE_OTHER): Payer: Medicare Other | Admitting: Ophthalmology

## 2020-10-05 DIAGNOSIS — E113512 Type 2 diabetes mellitus with proliferative diabetic retinopathy with macular edema, left eye: Secondary | ICD-10-CM

## 2020-10-05 DIAGNOSIS — E113511 Type 2 diabetes mellitus with proliferative diabetic retinopathy with macular edema, right eye: Secondary | ICD-10-CM | POA: Diagnosis not present

## 2020-10-05 DIAGNOSIS — H34811 Central retinal vein occlusion, right eye, with macular edema: Secondary | ICD-10-CM | POA: Diagnosis not present

## 2020-10-05 MED ORDER — BEVACIZUMAB 2.5 MG/0.1ML IZ SOSY
2.5000 mg | PREFILLED_SYRINGE | INTRAVITREAL | Status: AC | PRN
  Administered 2020-10-05: 09:00:00 2.5 mg via INTRAVITREAL

## 2020-10-05 MED ORDER — FLUORESCEIN SODIUM 10 % IV SOLN
500.0000 mg | INTRAVENOUS | Status: AC | PRN
  Administered 2020-10-05: 09:00:00 500 mg via INTRAVENOUS

## 2020-10-05 NOTE — Progress Notes (Signed)
10/05/2020     CHIEF COMPLAINT Patient presents for Retina Follow Up (2 Wk F/U OU, FP/FFA R/L, poss Avastin OD//Pt sts VA has improved OU since last visit. No new symptoms OU./LBS: 87 yesterday AM)   HISTORY OF PRESENT ILLNESS: Ann Gomez is a 70 y.o. female who presents to the clinic today for:   HPI     Retina Follow Up           Diagnosis: Diabetic Retinopathy   Laterality: both eyes   Onset: 2 weeks ago   Severity: mild   Duration: 2 weeks   Course: gradually improving   Comments: 2 Wk F/U OU, FP/FFA R/L, poss Avastin OD  Pt sts VA has improved OU since last visit. No new symptoms OU. LBS: 53 yesterday AM       Last edited by Ileana Roup, COA on 10/05/2020  8:20 AM.      Referring physician: Sherral Hammers, FNP [phone] [address] [place],  [place] [address]  HISTORICAL INFORMATION:   Selected notes from the MEDICAL RECORD NUMBER    Lab Results  Component Value Date   HGBA1C 9.6 (H) 06/08/2009     CURRENT MEDICATIONS: Current Outpatient Medications (Ophthalmic Drugs)  Medication Sig   dorzolamide-timolol (COSOPT) 22.3-6.8 MG/ML ophthalmic solution 1 drop 2 (two) times daily.   latanoprost (XALATAN) 0.005 % ophthalmic solution 1 drop daily.   No current facility-administered medications for this visit. (Ophthalmic Drugs)   Current Outpatient Medications (Other)  Medication Sig   amLODipine (NORVASC) 10 MG tablet Take 10 mg by mouth daily.   aspirin EC 81 MG tablet Take 1 tablet (81 mg total) by mouth daily.   benazepril (LOTENSIN) 40 MG tablet Take 1 tablet (40 mg total) by mouth daily.   furosemide (LASIX) 40 MG tablet Take 1 tablet (40 mg total) by mouth daily.   insulin glargine (LANTUS) 100 UNIT/ML injection Inject 30 Units into the skin every morning. 25 units in the pm   loperamide (IMODIUM) 2 MG capsule Take 1 capsule (2 mg total) by mouth 2 (two) times daily as needed for diarrhea or loose stools.   losartan (COZAAR) 100 MG tablet  Take 100 mg by mouth daily.   metFORMIN (GLUCOPHAGE) 1000 MG tablet Take 1 tablet (1,000 mg total) by mouth 2 (two) times daily with a meal.   metoprolol (LOPRESSOR) 50 MG tablet Take 1 tablet (50 mg total) by mouth 2 (two) times daily.   ondansetron (ZOFRAN) 4 MG tablet Take 1 tablet (4 mg total) by mouth every 8 (eight) hours as needed for nausea or vomiting.   pravastatin (PRAVACHOL) 20 MG tablet Take 20 mg by mouth at bedtime.   terazosin (HYTRIN) 1 MG capsule Take 2 capsules (2 mg total) by mouth at bedtime.   No current facility-administered medications for this visit. (Other)      REVIEW OF SYSTEMS:    ALLERGIES Allergies  Allergen Reactions   Hydrochlorothiazide Other (See Comments)    dizzy    PAST MEDICAL HISTORY Past Medical History:  Diagnosis Date   Diabetes mellitus    Edema    Essential hypertension, benign    Hypertension    Obesity, unspecified    Past Surgical History:  Procedure Laterality Date   CESAREAN SECTION      FAMILY HISTORY Family History  Family history unknown: Yes    SOCIAL HISTORY Social History   Tobacco Use   Smoking status: Never   Smokeless tobacco: Never  Substance Use Topics   Alcohol use: No   Drug use: No         OPHTHALMIC EXAM:  Base Eye Exam     Visual Acuity (ETDRS)       Right Left   Dist [place] 20/70 -2 20/30 +2   Dist ph [place] NI NI         Tonometry (Tonopen, 8:25 AM)       Right Left   Pressure 25 21  squeezing        Pupils       Pupils Dark Light Shape React APD   Right PERRL 3 3 Round Minimal None   Left PERRL 3 3 Round Minimal None         Visual Fields (Counting fingers)       Left Right    Full Full         Extraocular Movement       Right Left    Full Full         Neuro/Psych     Oriented x3: Yes   Mood/Affect: Normal         Dilation     Both eyes: 1.0% Mydriacyl, 2.5% Phenylephrine @ 8:26 AM           Slit Lamp and Fundus Exam     External Exam        Right Left   External Normal Normal         Slit Lamp Exam       Right Left   Lids/Lashes Normal Normal   Conjunctiva/Sclera White and quiet White and quiet   Cornea Clear Clear   Anterior Chamber Deep and quiet Deep and quiet   Iris Round and reactive Round and reactive   Lens Centered posterior chamber intraocular lens Centered posterior chamber intraocular lens   Anterior Vitreous Normal Normal         Fundus Exam       Right Left   Posterior Vitreous  Normal   Disc  Neovascularization   C/D Ratio 0.35 0.45   Macula no macular thickening, Intraretinal hemorrhage, Severe clinically significant macular edema, Microaneurysms Microaneurysms, Mild clinically significant macular edema   Vessels NPDR-Severe, and findings of intraretinal hemorrhages diffusely like incomplete central retinal vein occlusion with moderate enlargement of vein PDR-active   Periphery Diffuse intraretinal hemorrhages peripherally Dot-blot hemorrhage            IMAGING AND PROCEDURES  Imaging and Procedures for 10/05/20  Color Fundus Photography Optos - OU - Both Eyes       Right Eye Progression has worsened. Macula : edema, hemorrhage.   Left Eye Progression has been stable.   Notes OD with new onset central retinal vein occlusion with blood and thunder type of intraretinal hemorrhage diffusely with macular thickening retinal thickening and massive CME centrally OD.   OS with multiple cotton-wool spots in the posterior pole this does suggest potential hypertensive retinopathy upon superimposed upon diabetic retinopathy.      Fluorescein Angiography [place] (Transit OD)       Injection: 500 mg Fluorescein Sodium 10 %   Route: Intravenous   NDC: 7062-3762-83   Right Eye   Progression has worsened. Early phase findings include leakage, vascular perfusion defect, microaneurysm. Mid/Late phase findings include microaneurysm, leakage. Choroidal neovascularization is not  present.   Left Eye Mid/Late phase findings include vascular perfusion defect.   Notes OD with severe NPDR superimposed mostly superior Hemi central retinal vein  occlusion but early diffuse CRV O with secondary macular edema OD.  OS with very severe NPDR with retinal nonperfusion nasal to the nerve Multifocal leaking spots in the posterior pole and peripheral vascular perfusion defects, capillary nonperfusion     OCT, Retina - OU - Both Eyes       Right Eye Quality was good. Scan locations included subfoveal. Central Foveal Thickness: 994. Progression has been stable. Findings include abnormal foveal contour, cystoid macular edema.   Left Eye Quality was good. Scan locations included subfoveal. Central Foveal Thickness: 298. Findings include vitreomacular adhesion .   Notes .  OD with rather massive CME, CSME new and profound over the last 3 to 4 months.  We will need to follow-up examination with fluorescein angiography formed today and and commence with injection into vegF OD  OS with juxta foveal and center involved CSME Stable at current interval postinjection intravitreal Avastin, 11 weeks previous.       Intravitreal Injection, Pharmacologic Agent - OD - Right Eye       Time Out 10/05/2020. 9:11 AM. Confirmed correct patient, procedure, site, and patient consented.   Anesthesia Topical anesthesia was used. Anesthetic medications included Akten 3.5%.   Procedure Preparation included Tobramycin 0.3%, 10% betadine to eyelids, 5% betadine to ocular surface. A 30 gauge needle was used.   Injection: 2.5 mg bevacizumab 2.5 MG/0.1ML   Route: Intravitreal   NDC: 16109-604-5471449-091-43   Post-op Post injection exam found visual acuity of at least counting fingers. The patient tolerated the procedure well. There were no complications. The patient received written and verbal post procedure care education. Post injection medications were not given.               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  We will continue to monitor for use of antivegF as required and deliver peripheral PRP anteriorly for long-term control     ICD-10-CM   1. Central retinal vein occlusion with macular edema of right eye  H34.8110 Intravitreal Injection, Pharmacologic Agent - OD - Right Eye    bevacizumab (AVASTIN) SOSY 2.5 mg    2. Proliferative diabetic retinopathy of right eye with macular edema associated with type 2 diabetes mellitus (HCC)  U98.1191E11.3511 Color Fundus Photography Optos - OU - Both Eyes    Fluorescein Angiography [place] (Transit OD)    OCT, Retina - OU - Both Eyes    Fluorescein Sodium 10 % injection 500 mg    Intravitreal Injection, Pharmacologic Agent - OD - Right Eye    bevacizumab (AVASTIN) SOSY 2.5 mg    3. Proliferative diabetic retinopathy of left eye with macular edema associated with type 2 diabetes mellitus (HCC)  Y78.2956E11.3512 Color Fundus Photography Optos - OU - Both Eyes    Fluorescein Angiography [place] (Transit OD)    OCT, Retina - OU - Both Eyes    Fluorescein Sodium 10 % injection 500 mg    4. Diabetic macular edema of right eye with proliferative retinopathy associated with type 2 diabetes  mellitus (HCC)  E11.3511       1.  New onset CRV O OD with massive CME, superimposed on underlying PDR early with CSME.  This finding in conjunction with multiple cotton-wool spots in the posterior pole the  left eye suggest ongoing superimposed hypertensive retinopathy on diabetic retinopathy and thus I did ask and inquire where the patient had review of systems that might suggest underlying undiagnosed and untreated sleep apnea.  The signs are soft for sleep apnea however Will commence with intravitreal antivegF, Avastin today OD to calm and to try to reverse massive CME and vision loss. 2.  We will suggest patient continue to follow-up primary care physician to assess and to control any episodic or ongoing hypertension superimposed upon diabetic retinopathy  3.  OS follow-up as scheduled If not scheduled, 5-week follow-up OS ,likely need PRP after the next antivegF OS   4.  Follow-up dilated examination right eye in 5 weeks, may also 1-Day need peripheral PRP to calm the retinal nonperfusion regions Ophthalmic Meds Ordered this visit:  Meds ordered this encounter  Medications   Fluorescein Sodium 10 % injection 500 mg   bevacizumab (AVASTIN) SOSY 2.5 mg       Return in about 5 weeks (around 11/09/2020) for dilate, OD, AVASTIN OCT,, and follow-up OS as scheduled for injection.  There are no Patient Instructions on file for this visit.   Explained the diagnoses, plan, and follow up with the patient and they expressed understanding.  Patient expressed understanding of the importance of proper follow up care.   Alford Highland Jameila Keeny M.D. Diseases & Surgery of the Retina and Vitreous [facility] 10/05/20     Abbreviations: M myopia (nearsighted); A astigmatism; H hyperopia (farsighted); P presbyopia; Mrx spectacle prescription;  CTL contact lenses; OD right eye; OS left eye; OU both eyes  XT exotropia; ET esotropia; PEK punctate epithelial keratitis; PEE punctate epithelial erosions; DES dry eye syndrome; MGD meibomian gland dysfunction; ATs artificial tears; PFAT's preservative free artificial tears; NSC nuclear sclerotic cataract; PSC posterior subcapsular cataract; ERM  epi-retinal membrane; PVD posterior vitreous detachment; RD retinal detachment; DM diabetes mellitus; DR diabetic retinopathy; NPDR non-proliferative diabetic retinopathy; PDR proliferative diabetic retinopathy; CSME clinically significant macular edema; DME diabetic macular edema; dbh dot blot hemorrhages; CWS cotton wool spot; POAG primary open angle glaucoma; C/D cup-to-disc ratio; HVF humphrey visual field; GVF goldmann visual field; OCT optical coherence tomography; IOP intraocular pressure; BRVO Branch retinal vein occlusion; CRVO central retinal vein occlusion; CRAO central retinal artery occlusion; BRAO branch retinal artery occlusion; RT retinal tear; SB scleral buckle; PPV pars plana vitrectomy; VH Vitreous hemorrhage; PRP panretinal laser photocoagulation; IVK intravitreal kenalog; VMT vitreomacular traction; MH Macular hole;  NVD neovascularization of the disc; NVE neovascularization elsewhere; AREDS age related eye disease study; ARMD age related macular degeneration; POAG primary open angle glaucoma; EBMD epithelial/anterior basement membrane dystrophy; ACIOL anterior chamber intraocular lens; IOL intraocular lens; PCIOL posterior chamber intraocular lens; Phaco/IOL phacoemulsification with intraocular lens placement; PRK photorefractive keratectomy; LASIK laser assisted in situ keratomileusis; HTN hypertension; DM diabetes mellitus; COPD chronic obstructive pulmonary disease

## 2020-10-05 NOTE — Assessment & Plan Note (Signed)
We will commence with therapy for CRV O superimposed upon severe NPDR and CME OD

## 2020-10-05 NOTE — Assessment & Plan Note (Signed)
Will need control with antivegF as well and then will treat with peripheral PRP to retinal nonperfusion

## 2020-10-05 NOTE — Assessment & Plan Note (Signed)
Early NVE, significant capillary dropout left eye confirmed on fluorescein angiography.  We will continue to monitor for use of antivegF as required and deliver peripheral PRP anteriorly for long-term control

## 2020-11-02 ENCOUNTER — Ambulatory Visit (INDEPENDENT_AMBULATORY_CARE_PROVIDER_SITE_OTHER): Payer: Medicare Other | Admitting: Ophthalmology

## 2020-11-02 ENCOUNTER — Other Ambulatory Visit: Payer: Self-pay

## 2020-11-02 ENCOUNTER — Encounter (INDEPENDENT_AMBULATORY_CARE_PROVIDER_SITE_OTHER): Payer: Self-pay | Admitting: Ophthalmology

## 2020-11-02 DIAGNOSIS — E113512 Type 2 diabetes mellitus with proliferative diabetic retinopathy with macular edema, left eye: Secondary | ICD-10-CM

## 2020-11-02 DIAGNOSIS — H34811 Central retinal vein occlusion, right eye, with macular edema: Secondary | ICD-10-CM

## 2020-11-02 MED ORDER — BEVACIZUMAB 2.5 MG/0.1ML IZ SOSY
2.5000 mg | PREFILLED_SYRINGE | INTRAVITREAL | Status: AC | PRN
  Administered 2020-11-02: 2.5 mg via INTRAVITREAL

## 2020-11-02 NOTE — Assessment & Plan Note (Signed)
Stable CSME OS, will need long-term decrease in vegF burden by peripheral PRP as findings of white lines of retinal nonperfusion visible on clinical examination peripherally

## 2020-11-02 NOTE — Progress Notes (Signed)
11/02/2020     CHIEF COMPLAINT Patient presents for Retina Follow Up (4 week fu OD and Avastin OS/Pt states VA OU stable since last visit. Pt denies FOL, floaters, or ocular pain OU. /A1C:7.0/LBS:157/Pt reports using Latanoprost QHS OU)   HISTORY OF PRESENT ILLNESS: Ann Gomez is a 70 y.o. female who presents to the clinic today for:   HPI     Retina Follow Up           Diagnosis: Diabetic Retinopathy   Laterality: left eye   Onset: 4 weeks ago   Severity: mild   Duration: 4 weeks   Course: stable   Comments: 4 week fu OD and Avastin OS Pt states VA OU stable since last visit. Pt denies FOL, floaters, or ocular pain OU.  A1C:7.0 LBS:157 Pt reports using Latanoprost QHS OU       Last edited by Demetrios Loll, COA on 11/02/2020  8:14 AM.      Referring physician: Sherral Hammers, FNP [phone] S. [address] [place],  [place] [address]  HISTORICAL INFORMATION:   Selected notes from the MEDICAL RECORD NUMBER    Lab Results  Component Value Date   HGBA1C 9.6 (H) 06/08/2009     CURRENT MEDICATIONS: Current Outpatient Medications (Ophthalmic Drugs)  Medication Sig   dorzolamide-timolol (COSOPT) 22.3-6.8 MG/ML ophthalmic solution 1 drop 2 (two) times daily.   latanoprost (XALATAN) 0.005 % ophthalmic solution 1 drop daily.   No current facility-administered medications for this visit. (Ophthalmic Drugs)   Current Outpatient Medications (Other)  Medication Sig   amLODipine (NORVASC) 10 MG tablet Take 10 mg by mouth daily.   aspirin EC 81 MG tablet Take 1 tablet (81 mg total) by mouth daily.   benazepril (LOTENSIN) 40 MG tablet Take 1 tablet (40 mg total) by mouth daily.   furosemide (LASIX) 40 MG tablet Take 1 tablet (40 mg total) by mouth daily.   insulin glargine (LANTUS) 100 UNIT/ML injection Inject 30 Units into the skin every morning. 25 units in the pm   loperamide (IMODIUM) 2 MG capsule Take 1 capsule (2 mg total) by mouth 2 (two) times daily as needed for  diarrhea or loose stools.   losartan (COZAAR) 100 MG tablet Take 100 mg by mouth daily.   metFORMIN (GLUCOPHAGE) 1000 MG tablet Take 1 tablet (1,000 mg total) by mouth 2 (two) times daily with a meal.   metoprolol (LOPRESSOR) 50 MG tablet Take 1 tablet (50 mg total) by mouth 2 (two) times daily.   ondansetron (ZOFRAN) 4 MG tablet Take 1 tablet (4 mg total) by mouth every 8 (eight) hours as needed for nausea or vomiting.   pravastatin (PRAVACHOL) 20 MG tablet Take 20 mg by mouth at bedtime.   terazosin (HYTRIN) 1 MG capsule Take 2 capsules (2 mg total) by mouth at bedtime.   No current facility-administered medications for this visit. (Other)      REVIEW OF SYSTEMS:    ALLERGIES Allergies  Allergen Reactions   Hydrochlorothiazide Other (See Comments)    dizzy    PAST MEDICAL HISTORY Past Medical History:  Diagnosis Date   Diabetes mellitus    Edema    Essential hypertension, benign    Hypertension    Obesity, unspecified    Past Surgical History:  Procedure Laterality Date   CESAREAN SECTION      FAMILY HISTORY Family History  Family history unknown: Yes    SOCIAL HISTORY Social History   Tobacco Use  Smoking status: Never   Smokeless tobacco: Never  Substance Use Topics   Alcohol use: No   Drug use: No         OPHTHALMIC EXAM:  Base Eye Exam     Visual Acuity (ETDRS)       Right Left   Dist [place] 20/30 -2 20/30 -2   Dist ph [place] NI NI         Tonometry (Tonopen, 8:17 AM)       Right Left   Pressure 25 23         Pupils       Pupils Dark Light Shape React APD   Right PERRL 3 3 Round Minimal None   Left PERRL 3 3 Round Minimal None         Visual Fields (Counting fingers)       Left Right    Full Full         Extraocular Movement       Right Left    Full Full         Neuro/Psych     Oriented x3: Yes   Mood/Affect: Normal         Dilation     Left eye: 1.0% Mydriacyl, 2.5% Phenylephrine @ 8:17 AM            Slit Lamp and Fundus Exam     External Exam       Right Left   External Normal Normal         Slit Lamp Exam       Right Left   Lids/Lashes Normal Normal   Conjunctiva/Sclera White and quiet White and quiet   Cornea Clear Clear   Anterior Chamber Deep and quiet Deep and quiet   Iris Round and reactive Round and reactive   Lens Centered posterior chamber intraocular lens Centered posterior chamber intraocular lens   Anterior Vitreous Normal Normal         Fundus Exam       Right Left   Posterior Vitreous  Normal   Disc  No active N/V, Vitt Pap traction of is clinically visible   C/D Ratio  0.45   Macula  Microaneurysms, Mild clinically significant macular edema   Vessels  PDR-active, white lines of retinal nonperfusion nasally and inferiorly on clinical exam   Periphery  Dot-blot hemorrhage            IMAGING AND PROCEDURES  Imaging and Procedures for 11/02/20  OCT, Retina - OU - Both Eyes       Right Eye Quality was good. Scan locations included subfoveal. Central Foveal Thickness: 283. Progression has been stable. Findings include abnormal foveal contour, cystoid macular edema.   Left Eye Quality was good. Scan locations included subfoveal. Central Foveal Thickness: 292. Findings include vitreomacular adhesion .   Notes .  OD with rather massive CME, CSME new and profound over the last 3 to 4 months.  Now vastly improved only 4 weeks after most recent injection intravitreal Avastin OD.  Follow-up as scheduled in 1 week OD   OS with juxta foveal and center involved CSME Stable at current interval postinjection intravitreal Avastin, 6 weeks and 4 days previous.,  Repeat injection Avastin today OS       Intravitreal Injection, Pharmacologic Agent - OS - Left Eye       Time Out 11/02/2020. 8:36 AM. Confirmed correct patient, procedure, site, and patient consented.   Anesthesia Topical anesthesia was used. Anesthetic  medications included Akten 3.5%.    Procedure Preparation included Tobramycin 0.3%, 10% betadine to eyelids, 5% betadine to ocular surface. A 30 gauge needle was used.   Injection: 2.5 mg bevacizumab 2.5 MG/0.1ML   Route: Intravitreal, Site: Left Eye   NDC: [phone], Lot: [id number]   Post-op Post injection exam found visual acuity of at least counting fingers. The patient tolerated the procedure well. There were no complications. The patient received written and verbal post procedure care education. Post injection medications were not given.              ASSESSMENT/PLAN:  Central retinal vein occlusion with macular edema of right eye Recent exacerbation of CME from CRV O superimposed upon diabetic retinopathy, treated 10-05-2020 now vastly improved post Avastin and will follow-up next week as scheduled OD  Proliferative diabetic retinopathy of left eye with macular edema associated with type 2 diabetes mellitus (HCC) Stable CSME OS, will need long-term decrease in vegF burden by peripheral PRP as findings of white lines of retinal nonperfusion visible on clinical examination peripherally     ICD-10-CM   1. 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 Central retinal vein occlusion with macular edema of right eye  H34.8110       1.  OD vastly improved acuity and macular edema from central retinal vein occlusion superimposed upon advanced diabetic retinopathy right eye some 4 weeks post injection Avastin.  Acuity is improved from 20/200 now to 20/30  2.  OS with stable CSME currently at 6-week 4-day follow-up interval.  Will need repeat Avastin today to control the neovascular component of disease and will need peripheral PRP for long-term decrease in vegF burden  3.  Ophthalmic Meds Ordered this  visit:  Meds ordered this encounter  Medications   bevacizumab (AVASTIN) SOSY 2.5 mg       Return in about 3 weeks (around 11/23/2020) for dilate, OS, PRP,, and as scheduled OD Avastin OCT.  There are no Patient Instructions on file for this visit.   Explained the diagnoses, plan, and follow up with the patient and they expressed understanding.  Patient expressed understanding of the importance of proper follow up care.   Alford Highland Awanda Wilcock M.D. Diseases & Surgery of the Retina and Vitreous [facility] 11/02/20     Abbreviations: M myopia (nearsighted); A astigmatism; H hyperopia (farsighted); P presbyopia; Mrx spectacle prescription;  CTL contact lenses; OD right eye; OS left eye; OU both eyes  XT exotropia; ET esotropia; PEK punctate epithelial keratitis; PEE punctate epithelial erosions; DES dry eye syndrome; MGD meibomian gland dysfunction; ATs artificial tears; PFAT's preservative free artificial tears; NSC nuclear sclerotic cataract; PSC posterior subcapsular cataract; ERM epi-retinal membrane; PVD posterior vitreous detachment; RD retinal detachment; DM diabetes mellitus; DR diabetic retinopathy; NPDR non-proliferative diabetic retinopathy; PDR proliferative diabetic retinopathy; CSME clinically significant macular edema; DME diabetic macular edema; dbh dot blot hemorrhages; CWS cotton wool spot; POAG primary open angle glaucoma; C/D cup-to-disc ratio; HVF humphrey visual field; GVF goldmann visual field; OCT optical coherence tomography; IOP intraocular pressure; BRVO Branch retinal vein occlusion; CRVO central retinal vein occlusion; CRAO central retinal artery occlusion; BRAO branch retinal artery occlusion; RT retinal tear; SB scleral buckle; PPV pars plana vitrectomy; VH Vitreous hemorrhage;  PRP panretinal laser photocoagulation; IVK intravitreal kenalog; VMT vitreomacular traction; MH Macular hole;  NVD neovascularization of the disc; NVE neovascularization elsewhere;  AREDS age related eye disease study; ARMD age related macular degeneration; POAG primary open angle glaucoma; EBMD epithelial/anterior basement membrane dystrophy; ACIOL anterior chamber intraocular lens; IOL intraocular lens; PCIOL posterior chamber intraocular lens; Phaco/IOL phacoemulsification with intraocular lens placement; PRK photorefractive keratectomy; LASIK laser assisted in situ keratomileusis; HTN hypertension; DM diabetes mellitus; COPD chronic obstructive pulmonary disease

## 2020-11-02 NOTE — Assessment & Plan Note (Signed)
Recent exacerbation of CME from CRV O superimposed upon diabetic retinopathy, treated 10-05-2020 now vastly improved post Avastin and will follow-up next week as scheduled OD

## 2020-11-09 ENCOUNTER — Encounter (INDEPENDENT_AMBULATORY_CARE_PROVIDER_SITE_OTHER): Payer: Medicare Other | Admitting: Ophthalmology

## 2020-11-11 DIAGNOSIS — I499 Cardiac arrhythmia, unspecified: Secondary | ICD-10-CM | POA: Insufficient documentation

## 2020-11-17 ENCOUNTER — Other Ambulatory Visit: Payer: Self-pay

## 2020-11-17 ENCOUNTER — Ambulatory Visit (INDEPENDENT_AMBULATORY_CARE_PROVIDER_SITE_OTHER): Payer: Medicare Other | Admitting: Ophthalmology

## 2020-11-17 ENCOUNTER — Encounter (INDEPENDENT_AMBULATORY_CARE_PROVIDER_SITE_OTHER): Payer: Self-pay | Admitting: Ophthalmology

## 2020-11-17 DIAGNOSIS — H401132 Primary open-angle glaucoma, bilateral, moderate stage: Secondary | ICD-10-CM

## 2020-11-17 DIAGNOSIS — H34811 Central retinal vein occlusion, right eye, with macular edema: Secondary | ICD-10-CM

## 2020-11-17 DIAGNOSIS — E113512 Type 2 diabetes mellitus with proliferative diabetic retinopathy with macular edema, left eye: Secondary | ICD-10-CM

## 2020-11-17 MED ORDER — BEVACIZUMAB 2.5 MG/0.1ML IZ SOSY
2.5000 mg | PREFILLED_SYRINGE | INTRAVITREAL | Status: AC | PRN
  Administered 2020-11-17: 2.5 mg via INTRAVITREAL

## 2020-11-17 NOTE — Assessment & Plan Note (Addendum)
At 2 weeks postinjection, improved CSME OS, needs PRP next week for peripheral nonperfusion

## 2020-11-17 NOTE — Assessment & Plan Note (Signed)
Improved intraocular pressures OU today

## 2020-11-17 NOTE — Progress Notes (Signed)
11/17/2020     CHIEF COMPLAINT Patient presents for Retina Follow Up (6 week fu OD and Avastin OD/Pt states VA OU stable since last visit. Pt denies FOL, floaters, or ocular pain OU. /A1C:7.0/LBS:136/Pt reports using Cosopt BID OU and Latanoprost QHS OU/)   HISTORY OF PRESENT ILLNESS: Ann Gomez is a 70 y.o. female who presents to the clinic today for:   HPI     Retina Follow Up           Diagnosis: CRVO/BRVO   Laterality: right eye   Onset: 6 weeks ago   Severity: mild   Duration: 6 weeks   Course: stable   Comments: 6 week fu OD and Avastin OD Pt states VA OU stable since last visit. Pt denies FOL, floaters, or ocular pain OU.  A1C:7.0 LBS:136 Pt reports using Cosopt BID OU and Latanoprost QHS OU          Comments   Much improved acuity as compared to 6 weeks previous      Last edited by Edmon Crapeankin, Mirjana Tarleton A, MD on 11/17/2020  9:09 AM.      Referring physician: Sherral HammersWilliams, Alvesha A, FNP [phone] S. [address] [place] [id number]  HISTORICAL INFORMATION:   Selected notes from the MEDICAL RECORD NUMBER    Lab Results  Component Value Date   HGBA1C 9.6 (H) 06/08/2009     CURRENT MEDICATIONS: Current Outpatient Medications (Ophthalmic Drugs)  Medication Sig   dorzolamide-timolol (COSOPT) 22.3-6.8 MG/ML ophthalmic solution 1 drop 2 (two) times daily.   latanoprost (XALATAN) 0.005 % ophthalmic solution 1 drop daily.   No current facility-administered medications for this visit. (Ophthalmic Drugs)   Current Outpatient Medications (Other)  Medication Sig   amLODipine (NORVASC) 10 MG tablet Take 10 mg by mouth daily.   aspirin EC 81 MG tablet Take 1 tablet (81 mg total) by mouth daily.   benazepril (LOTENSIN) 40 MG tablet Take 1 tablet (40 mg total) by mouth daily.   furosemide (LASIX) 40 MG tablet Take 1 tablet (40 mg total) by mouth daily.   insulin glargine (LANTUS) 100 UNIT/ML injection Inject 30 Units into the skin every morning. 25 units in the pm    loperamide (IMODIUM) 2 MG capsule Take 1 capsule (2 mg total) by mouth 2 (two) times daily as needed for diarrhea or loose stools.   losartan (COZAAR) 100 MG tablet Take 100 mg by mouth daily.   metFORMIN (GLUCOPHAGE) 1000 MG tablet Take 1 tablet (1,000 mg total) by mouth 2 (two) times daily with a meal.   metoprolol (LOPRESSOR) 50 MG tablet Take 1 tablet (50 mg total) by mouth 2 (two) times daily.   ondansetron (ZOFRAN) 4 MG tablet Take 1 tablet (4 mg total) by mouth every 8 (eight) hours as needed for nausea or vomiting.   pravastatin (PRAVACHOL) 20 MG tablet Take 20 mg by mouth at bedtime.   terazosin (HYTRIN) 1 MG capsule Take 2 capsules (2 mg total) by mouth at bedtime.   No current facility-administered medications for this visit. (Other)      REVIEW OF SYSTEMS:    ALLERGIES Allergies  Allergen Reactions   Hydrochlorothiazide Other (See Comments)    dizzy    PAST MEDICAL HISTORY Past Medical History:  Diagnosis Date   Diabetes mellitus    Edema    Essential hypertension, benign    Hypertension    Obesity, unspecified    Past Surgical History:  Procedure Laterality Date   CESAREAN SECTION  FAMILY HISTORY Family History  Family history unknown: Yes    SOCIAL HISTORY Social History   Tobacco Use   Smoking status: Never   Smokeless tobacco: Never  Substance Use Topics   Alcohol use: No   Drug use: No         OPHTHALMIC EXAM:  Base Eye Exam     Visual Acuity (ETDRS)       Right Left   Dist [place] 20/80 20/30 +1   Dist ph [place] 20/70 -1 NI         Tonometry (Tonopen, 8:38 AM)       Right Left   Pressure 17 18         Pupils       Pupils Dark Light Shape React APD   Right PERRL 3 3 Round Minimal None   Left PERRL 3 3 Round Minimal None         Visual Fields (Counting fingers)       Left Right    Full Full         Extraocular Movement       Right Left    Full Full         Neuro/Psych     Oriented x3: Yes    Mood/Affect: Normal         Dilation     Right eye: 1.0% Mydriacyl, 2.5% Phenylephrine @ 8:38 AM           Slit Lamp and Fundus Exam     External Exam       Right Left   External Normal Normal         Slit Lamp Exam       Right Left   Lids/Lashes Normal Normal   Conjunctiva/Sclera White and quiet White and quiet   Cornea Clear Clear   Anterior Chamber Deep and quiet Deep and quiet   Iris Round and reactive Round and reactive   Lens Centered posterior chamber intraocular lens Centered posterior chamber intraocular lens   Anterior Vitreous Normal Normal         Fundus Exam       Right Left   C/D Ratio 0.35 0.45   Macula no macular thickening, Intraretinal hemorrhage, Severe clinically significant macular edema, Microaneurysms    Vessels NPDR-Severe, and findings of intraretinal hemorrhages diffusely like incomplete central retinal vein occlusion with moderate enlargement of vein    Periphery Diffuse intraretinal hemorrhages peripherally             IMAGING AND PROCEDURES  Imaging and Procedures for 11/17/20  OCT, Retina - OU - Both Eyes       Right Eye Quality was good. Scan locations included subfoveal. Central Foveal Thickness: 235. Progression has been stable. Findings include abnormal foveal contour, cystoid macular edema.   Left Eye Quality was good. Scan locations included subfoveal. Central Foveal Thickness: 292. Findings include vitreomacular adhesion .   Notes OD vastly improved macular thickening from 994 m now down to 235 m center involved CME and only a small serous subfoveal detachment remains post therapy for ischemic CRV O OD, repeat intravitreal Avastin OD today   OS with juxta foveal and center involved CSME Stable at current interval postinjection intravitreal Avastin, 2 weeks 1 day previous.,        Intravitreal Injection, Pharmacologic Agent - OD - Right Eye       Time Out 11/17/2020. 9:11 AM. Confirmed correct patient,  procedure, site, and patient consented.  Anesthesia Topical anesthesia was used. Anesthetic medications included Akten 3.5%.   Procedure Preparation included Tobramycin 0.3%, 10% betadine to eyelids, 5% betadine to ocular surface. A 30 gauge needle was used.   Injection: 2.5 mg bevacizumab 2.5 MG/0.1ML   Route: Intravitreal, Site: Right Eye   NDC: [phone], Lot: [id number]   Post-op Post injection exam found visual acuity of at least counting fingers. The patient tolerated the procedure well. There were no complications. The patient received written and verbal post procedure care education. Post injection medications were not given.              ASSESSMENT/PLAN:  Central retinal vein occlusion with macular edema of right eye Vastly improved CME and CSME from ischemic CRV O as compared to October 05, 2020.  Post Avastin at 6-week interval today.  Repeat injection today and maintain 5 to 6-week follow-up OD  Proliferative diabetic retinopathy of left eye with macular edema associated with type 2 diabetes mellitus (HCC) At 2 weeks postinjection, improved CSME OS, needs PRP next week for peripheral nonperfusion  Primary open angle glaucoma of both eyes, moderate stage Improved intraocular pressures OU today     ICD-10-CM   1. Central retinal vein occlusion with macular edema of right eye  H34.8110 OCT, Retina - OU - Both Eyes    Intravitreal Injection, Pharmacologic Agent - OD - Right Eye    bevacizumab (AVASTIN) SOSY 2.5 mg    2. Proliferative diabetic retinopathy of left eye with macular edema associated with type 2 diabetes mellitus (HCC)  J24.2683     3. Primary open angle glaucoma of both eyes, moderate stage  H40.1132       1.  2.  3.  Ophthalmic Meds Ordered this visit:  Meds ordered this encounter  Medications   bevacizumab (AVASTIN) SOSY 2.5 mg       Return in about 6 weeks (around 12/29/2020) for dilate, OD, AVASTIN OCT, PRP OS next week,, follow-up OS 3  to 4 weeks, Avastin OCT.  There are no Patient Instructions on file for this visit.   Explained the diagnoses, plan, and follow up with the patient and they expressed understanding.  Patient expressed understanding of the importance of proper follow up care.   Alford Highland Floyce Bujak M.D. Diseases & Surgery of the Retina and Vitreous [facility] 11/17/20     Abbreviations: M myopia (nearsighted); A astigmatism; H hyperopia (farsighted); P presbyopia; Mrx spectacle prescription;  CTL contact lenses; OD right eye; OS left eye; OU both eyes  XT exotropia; ET esotropia; PEK punctate epithelial keratitis; PEE punctate epithelial erosions; DES dry eye syndrome; MGD meibomian gland dysfunction; ATs artificial tears; PFAT's preservative free artificial tears; NSC nuclear sclerotic cataract; PSC posterior subcapsular cataract; ERM epi-retinal membrane; PVD posterior vitreous detachment; RD retinal detachment; DM diabetes mellitus; DR diabetic retinopathy; NPDR non-proliferative diabetic retinopathy; PDR proliferative diabetic retinopathy; CSME clinically significant macular edema; DME diabetic macular edema; dbh dot blot hemorrhages; CWS cotton wool spot; POAG primary open angle glaucoma; C/D cup-to-disc ratio; HVF humphrey visual field; GVF goldmann visual field; OCT optical coherence tomography; IOP intraocular pressure; BRVO Branch retinal vein occlusion; CRVO central retinal vein occlusion; CRAO central retinal artery occlusion; BRAO branch retinal artery occlusion; RT retinal tear; SB scleral buckle; PPV pars plana vitrectomy; VH Vitreous hemorrhage; PRP panretinal laser photocoagulation; IVK intravitreal kenalog; VMT vitreomacular traction; MH Macular hole;  NVD neovascularization of the disc; NVE neovascularization elsewhere; AREDS age related eye disease study; ARMD age related macular  degeneration; POAG primary open angle glaucoma; EBMD epithelial/anterior basement membrane dystrophy; ACIOL  anterior chamber intraocular lens; IOL intraocular lens; PCIOL posterior chamber intraocular lens; Phaco/IOL phacoemulsification with intraocular lens placement; PRK photorefractive keratectomy; LASIK laser assisted in situ keratomileusis; HTN hypertension; DM diabetes mellitus; COPD chronic obstructive pulmonary disease

## 2020-11-17 NOTE — Assessment & Plan Note (Signed)
Vastly improved CME and CSME from ischemic CRV O as compared to October 05, 2020.  Post Avastin at 6-week interval today.  Repeat injection today and maintain 5 to 6-week follow-up OD

## 2020-11-25 ENCOUNTER — Encounter (INDEPENDENT_AMBULATORY_CARE_PROVIDER_SITE_OTHER): Payer: Self-pay | Admitting: Ophthalmology

## 2020-11-25 ENCOUNTER — Ambulatory Visit (INDEPENDENT_AMBULATORY_CARE_PROVIDER_SITE_OTHER): Payer: Medicare Other | Admitting: Ophthalmology

## 2020-11-25 ENCOUNTER — Other Ambulatory Visit: Payer: Self-pay

## 2020-11-25 ENCOUNTER — Encounter (INDEPENDENT_AMBULATORY_CARE_PROVIDER_SITE_OTHER): Payer: Medicare Other | Admitting: Ophthalmology

## 2020-11-25 DIAGNOSIS — E113512 Type 2 diabetes mellitus with proliferative diabetic retinopathy with macular edema, left eye: Secondary | ICD-10-CM | POA: Diagnosis not present

## 2020-11-25 DIAGNOSIS — H34811 Central retinal vein occlusion, right eye, with macular edema: Secondary | ICD-10-CM

## 2020-11-25 NOTE — Assessment & Plan Note (Signed)
PDR with extensive retinal nonperfusion peripherally.  PRP #1 delivered nasal superonasal today without difficulty or complication  Return visit left eye in 3 weeks for PRP #2

## 2020-11-25 NOTE — Assessment & Plan Note (Signed)
OD, 1 week post injection Avastin for CRV O with secondary CME

## 2020-11-25 NOTE — Progress Notes (Signed)
11/25/2020     CHIEF COMPLAINT Patient presents for Retina Follow Up (3 week PRP OS/Pt states VA OU stable since last visit. Pt denies FOL, floaters, or ocular pain OU. /A1C:7.0/LBS: Not checked this morning./Pt reports using Cosopt BID OU and Latanoprost QHS OU/)   HISTORY OF PRESENT ILLNESS: Ann Gomez is a 70 y.o. female who presents to the clinic today for:   HPI     Retina Follow Up           Diagnosis: CRVO/BRVO   Laterality: left eye   Onset: 3 weeks ago   Severity: mild   Duration: 3 weeks   Course: stable   Comments: 3 week PRP OS Pt states VA OU stable since last visit. Pt denies FOL, floaters, or ocular pain OU.  A1C:7.0 LBS: Not checked this morning. Pt reports using Cosopt BID OU and Latanoprost QHS OU        Last edited by Nelva Nay, COA on 11/25/2020  8:43 AM.      Referring physician: Sherral Hammers, FNP [phone] S. [address] [place],  [place] [address]  HISTORICAL INFORMATION:   Selected notes from the MEDICAL RECORD NUMBER    Lab Results  Component Value Date   HGBA1C 9.6 (H) 06/08/2009     CURRENT MEDICATIONS: Current Outpatient Medications (Ophthalmic Drugs)  Medication Sig   dorzolamide-timolol (COSOPT) 22.3-6.8 MG/ML ophthalmic solution 1 drop 2 (two) times daily.   latanoprost (XALATAN) 0.005 % ophthalmic solution 1 drop daily.   No current facility-administered medications for this visit. (Ophthalmic Drugs)   Current Outpatient Medications (Other)  Medication Sig   amLODipine (NORVASC) 10 MG tablet Take 10 mg by mouth daily.   aspirin EC 81 MG tablet Take 1 tablet (81 mg total) by mouth daily.   benazepril (LOTENSIN) 40 MG tablet Take 1 tablet (40 mg total) by mouth daily.   furosemide (LASIX) 40 MG tablet Take 1 tablet (40 mg total) by mouth daily.   insulin glargine (LANTUS) 100 UNIT/ML injection Inject 30 Units into the skin every morning. 25 units in the pm   loperamide (IMODIUM) 2 MG capsule Take 1 capsule (2 mg  total) by mouth 2 (two) times daily as needed for diarrhea or loose stools.   losartan (COZAAR) 100 MG tablet Take 100 mg by mouth daily.   metFORMIN (GLUCOPHAGE) 1000 MG tablet Take 1 tablet (1,000 mg total) by mouth 2 (two) times daily with a meal.   metoprolol (LOPRESSOR) 50 MG tablet Take 1 tablet (50 mg total) by mouth 2 (two) times daily.   ondansetron (ZOFRAN) 4 MG tablet Take 1 tablet (4 mg total) by mouth every 8 (eight) hours as needed for nausea or vomiting.   pravastatin (PRAVACHOL) 20 MG tablet Take 20 mg by mouth at bedtime.   terazosin (HYTRIN) 1 MG capsule Take 2 capsules (2 mg total) by mouth at bedtime.   No current facility-administered medications for this visit. (Other)      REVIEW OF SYSTEMS:    ALLERGIES Allergies  Allergen Reactions   Hydrochlorothiazide Other (See Comments)    dizzy    PAST MEDICAL HISTORY Past Medical History:  Diagnosis Date   Diabetes mellitus    Edema    Essential hypertension, benign    Hypertension    Obesity, unspecified    Past Surgical History:  Procedure Laterality Date   CESAREAN SECTION      FAMILY HISTORY Family History  Family history unknown: Yes  SOCIAL HISTORY Social History   Tobacco Use   Smoking status: Never   Smokeless tobacco: Never  Substance Use Topics   Alcohol use: No   Drug use: No         OPHTHALMIC EXAM:  Base Eye Exam     Visual Acuity (Snellen - Linear)       Right Left   Dist [place] 20/80 +2 20/30 +2   Dist ph [place] 20/70 -1          Tonometry (Tonopen, 8:35 AM)       Right Left   Pressure 33 20         Tonometry #2 (Tonopen, 8:35 AM)       Right Left   Pressure 28 20         Pupils       Pupils Dark Light Shape React APD   Right PERRL 4 3 Round Minimal None   Left PERRL 4 3 Round Minimal None         Visual Fields (Counting fingers)       Left Right    Full Full         Extraocular Movement       Right Left    Full Full          Neuro/Psych     Oriented x3: Yes   Mood/Affect: Normal         Dilation     Left eye: 1.0% Mydriacyl, 2.5% Phenylephrine @ 8:43 AM           Slit Lamp and Fundus Exam     External Exam       Right Left   External Normal Normal         Slit Lamp Exam       Right Left   Lids/Lashes Normal Normal   Conjunctiva/Sclera White and quiet White and quiet   Cornea Clear Clear   Anterior Chamber Deep and quiet Deep and quiet   Iris Round and reactive Round and reactive   Lens Centered posterior chamber intraocular lens Centered posterior chamber intraocular lens   Anterior Vitreous Normal Normal            IMAGING AND PROCEDURES  Imaging and Procedures for 11/25/20  Panretinal Photocoagulation - OS - Left Eye       Anesthesia Topical anesthesia was used. Anesthetic medications included Proparacaine 0.5%.   Laser Information The type of laser was diode. The duration in seconds was 0.03. The spot size was 390 microns. Laser power was 330. Total spots was 556.   Post-op The patient tolerated the procedure well. There were no complications. The patient received written and verbal post procedure care education.   Notes Treatment delivered superonasal and nasally             ASSESSMENT/PLAN:  Central retinal vein occlusion with macular edema of right eye OD, 1 week post injection Avastin for CRV O with secondary CME  Proliferative diabetic retinopathy of left eye with macular edema associated with type 2 diabetes mellitus (HCC) PDR with extensive retinal nonperfusion peripherally.  PRP #1 delivered nasal superonasal today without difficulty or complication  Return visit left eye in 3 weeks for PRP #2     ICD-10-CM   1. Proliferative diabetic retinopathy of left eye with macular edema associated with type 2 diabetes mellitus (HCC)  G92.1194 Panretinal Photocoagulation - OS - Left Eye    2. Central retinal vein occlusion with macular edema  of right eye   H34.8110       1.  2.  3.  Ophthalmic Meds Ordered this visit:  No orders of the defined types were placed in this encounter.      Return in about 3 weeks (around 12/16/2020) for dilate, OS, PRP,, and follow-up OD as scheduled Avastin OCT.  There are no Patient Instructions on file for this visit.   Explained the diagnoses, plan, and follow up with the patient and they expressed understanding.  Patient expressed understanding of the importance of proper follow up care.   Alford Highland Libertie Hausler M.D. Diseases & Surgery of the Retina and Vitreous [facility] 11/25/20     Abbreviations: M myopia (nearsighted); A astigmatism; H hyperopia (farsighted); P presbyopia; Mrx spectacle prescription;  CTL contact lenses; OD right eye; OS left eye; OU both eyes  XT exotropia; ET esotropia; PEK punctate epithelial keratitis; PEE punctate epithelial erosions; DES dry eye syndrome; MGD meibomian gland dysfunction; ATs artificial tears; PFAT's preservative free artificial tears; NSC nuclear sclerotic cataract; PSC posterior subcapsular cataract; ERM epi-retinal membrane; PVD posterior vitreous detachment; RD retinal detachment; DM diabetes mellitus; DR diabetic retinopathy; NPDR non-proliferative diabetic retinopathy; PDR proliferative diabetic retinopathy; CSME clinically significant macular edema; DME diabetic macular edema; dbh dot blot hemorrhages; CWS cotton wool spot; POAG primary open angle glaucoma; C/D cup-to-disc ratio; HVF humphrey visual field; GVF goldmann visual field; OCT optical coherence tomography; IOP intraocular pressure; BRVO Branch retinal vein occlusion; CRVO central retinal vein occlusion; CRAO central retinal artery occlusion; BRAO branch retinal artery occlusion; RT retinal tear; SB scleral buckle; PPV pars plana vitrectomy; VH Vitreous hemorrhage; PRP panretinal laser photocoagulation; IVK intravitreal kenalog; VMT vitreomacular traction; MH Macular hole;  NVD  neovascularization of the disc; NVE neovascularization elsewhere; AREDS age related eye disease study; ARMD age related macular degeneration; POAG primary open angle glaucoma; EBMD epithelial/anterior basement membrane dystrophy; ACIOL anterior chamber intraocular lens; IOL intraocular lens; PCIOL posterior chamber intraocular lens; Phaco/IOL phacoemulsification with intraocular lens placement; PRK photorefractive keratectomy; LASIK laser assisted in situ keratomileusis; HTN hypertension; DM diabetes mellitus; COPD chronic obstructive pulmonary disease

## 2020-12-11 ENCOUNTER — Telehealth: Payer: Self-pay | Admitting: *Deleted

## 2020-12-11 ENCOUNTER — Ambulatory Visit: Payer: Medicare Other | Admitting: Podiatry

## 2020-12-11 NOTE — Telephone Encounter (Signed)
Patient is reschedule her missed appointment today. Please call.

## 2020-12-14 ENCOUNTER — Telehealth: Payer: Self-pay | Admitting: *Deleted

## 2020-12-14 NOTE — Telephone Encounter (Signed)
Message completed

## 2020-12-15 ENCOUNTER — Other Ambulatory Visit: Payer: Self-pay

## 2020-12-15 ENCOUNTER — Ambulatory Visit (INDEPENDENT_AMBULATORY_CARE_PROVIDER_SITE_OTHER): Payer: Medicare Other | Admitting: Ophthalmology

## 2020-12-15 ENCOUNTER — Encounter (INDEPENDENT_AMBULATORY_CARE_PROVIDER_SITE_OTHER): Payer: Self-pay | Admitting: Ophthalmology

## 2020-12-15 DIAGNOSIS — H35372 Puckering of macula, left eye: Secondary | ICD-10-CM | POA: Diagnosis not present

## 2020-12-15 DIAGNOSIS — H34811 Central retinal vein occlusion, right eye, with macular edema: Secondary | ICD-10-CM | POA: Diagnosis not present

## 2020-12-15 DIAGNOSIS — E113512 Type 2 diabetes mellitus with proliferative diabetic retinopathy with macular edema, left eye: Secondary | ICD-10-CM

## 2020-12-15 DIAGNOSIS — H43822 Vitreomacular adhesion, left eye: Secondary | ICD-10-CM | POA: Diagnosis not present

## 2020-12-15 MED ORDER — BEVACIZUMAB 2.5 MG/0.1ML IZ SOSY
2.5000 mg | PREFILLED_SYRINGE | INTRAVITREAL | Status: AC | PRN
  Administered 2020-12-15: 2.5 mg via INTRAVITREAL

## 2020-12-15 NOTE — Progress Notes (Signed)
12/15/2020     CHIEF COMPLAINT Patient presents for  Chief Complaint  Patient presents with   Retina Follow Up      HISTORY OF PRESENT ILLNESS: Ann Gomez is a 70 y.o. female who presents to the clinic today for:   HPI     Retina Follow Up           Diagnosis: Diabetic Retinopathy   Laterality: left eye   Onset: 6 weeks ago   Severity: mild   Duration: 6 months   Course: stable         Comments   6 week fu OS and OCT. Avastin OS Pt states VA OU stable since last visit. Pt denies FOL, floaters, or ocular pain OU.  Pt reports using Cosopt BID OU and Latanoprost QHS OU       Last edited by Demetrios Loll, COA on 12/15/2020  8:53 AM.      Referring physician: Sherral Hammers, FNP [phone] [address] [place],  [place] [address]  HISTORICAL INFORMATION:   Selected notes from the MEDICAL RECORD NUMBER    Lab Results  Component Value Date   HGBA1C 9.6 (H) 06/08/2009     CURRENT MEDICATIONS: Current Outpatient Medications (Ophthalmic Drugs)  Medication Sig   dorzolamide-timolol (COSOPT) 22.3-6.8 MG/ML ophthalmic solution 1 drop 2 (two) times daily.   latanoprost (XALATAN) 0.005 % ophthalmic solution 1 drop daily.   No current facility-administered medications for this visit. (Ophthalmic Drugs)   Current Outpatient Medications (Other)  Medication Sig   amLODipine (NORVASC) 10 MG tablet Take 10 mg by mouth daily.   aspirin EC 81 MG tablet Take 1 tablet (81 mg total) by mouth daily.   benazepril (LOTENSIN) 40 MG tablet Take 1 tablet (40 mg total) by mouth daily.   furosemide (LASIX) 40 MG tablet Take 1 tablet (40 mg total) by mouth daily.   insulin glargine (LANTUS) 100 UNIT/ML injection Inject 30 Units into the skin every morning. 25 units in the pm   loperamide (IMODIUM) 2 MG capsule Take 1 capsule (2 mg total) by mouth 2 (two) times daily as needed for diarrhea or loose stools.   losartan (COZAAR) 100 MG tablet Take 100 mg by mouth daily.    metFORMIN (GLUCOPHAGE) 1000 MG tablet Take 1 tablet (1,000 mg total) by mouth 2 (two) times daily with a meal.   metoprolol (LOPRESSOR) 50 MG tablet Take 1 tablet (50 mg total) by mouth 2 (two) times daily.   ondansetron (ZOFRAN) 4 MG tablet Take 1 tablet (4 mg total) by mouth every 8 (eight) hours as needed for nausea or vomiting.   pravastatin (PRAVACHOL) 20 MG tablet Take 20 mg by mouth at bedtime.   terazosin (HYTRIN) 1 MG capsule Take 2 capsules (2 mg total) by mouth at bedtime.   No current facility-administered medications for this visit. (Other)      REVIEW OF SYSTEMS:    ALLERGIES Allergies  Allergen Reactions   Hydrochlorothiazide Other (See Comments)    dizzy    PAST MEDICAL HISTORY Past Medical History:  Diagnosis Date   Diabetes mellitus    Edema    Essential hypertension, benign    Hypertension    Obesity, unspecified    Past Surgical History:  Procedure Laterality Date   CESAREAN SECTION      FAMILY HISTORY Family History  Family history unknown: Yes    SOCIAL HISTORY Social History   Tobacco Use   Smoking status: Never  Smokeless tobacco: Never  Substance Use Topics   Alcohol use: No   Drug use: No         OPHTHALMIC EXAM:  Base Eye Exam     Visual Acuity (ETDRS)       Right Left   Dist [place] 20/100 -2 20/40 -2   Dist ph [place] 20/70 NI         Tonometry (Tonopen, 8:57 AM)       Right Left   Pressure 29 17         Tonometry #2 (Tonopen, 8:57 AM)       Right Left   Pressure 28          Pupils       Pupils Dark Light Shape React APD   Right PERRL 4 3 Round Sluggish None   Left PERRL 4 3 Round Sluggish None         Visual Fields       Left Right    Full Full         Extraocular Movement       Right Left    Full Full         Neuro/Psych     Oriented x3: Yes   Mood/Affect: Normal         Dilation     Left eye: 1.0% Mydriacyl, 2.5% Phenylephrine @ 8:57 AM           Slit Lamp and Fundus  Exam     External Exam       Right Left   External Normal Normal         Slit Lamp Exam       Right Left   Lids/Lashes Normal Normal   Conjunctiva/Sclera White and quiet White and quiet   Cornea Clear Clear   Anterior Chamber Deep and quiet Deep and quiet   Iris Round and reactive Round and reactive   Lens Centered posterior chamber intraocular lens Centered posterior chamber intraocular lens   Anterior Vitreous Normal Normal         Fundus Exam       Right Left   Posterior Vitreous  Normal   Disc  No active N/V, Vitt Pap traction of is clinically visible   C/D Ratio  0.45   Macula  Microaneurysms, Mild clinically significant macular edema   Vessels  PDR-active, white lines of retinal nonperfusion nasally and inferiorly on clinical exam   Periphery  Dot-blot hemorrhage            IMAGING AND PROCEDURES  Imaging and Procedures for 12/15/20  OCT, Retina - OU - Both Eyes       Right Eye Quality was good. Scan locations included subfoveal. Central Foveal Thickness: 196. Progression has been stable. Findings include abnormal foveal contour, cystoid macular edema.   Left Eye Quality was good. Scan locations included subfoveal. Central Foveal Thickness: 310. Findings include vitreomacular adhesion .   Notes OD vastly improved macular thickening from 994 m now down to 196 m center involved CME  OS with juxta foveal superonasal to FAZ involved CSME Stable at current interval postinjection intravitreal Avastin, 6-week 1 day postinjection OS Repeat injection OS today and examination again      Intravitreal Injection, Pharmacologic Agent - OS - Left Eye       Time Out 12/15/2020. 9:22 AM. Confirmed correct patient, procedure, site, and patient consented.   Anesthesia Topical anesthesia was used. Anesthetic medications included Akten 3.5%.  Procedure Preparation included Tobramycin 0.3%, 10% betadine to eyelids, 5% betadine to ocular surface. A 30 gauge  needle was used.   Injection: 2.5 mg bevacizumab 2.5 MG/0.1ML   Route: Intravitreal, Site: Left Eye   NDC: [phone], Lot: [id number]   Post-op Post injection exam found visual acuity of at least counting fingers. The patient tolerated the procedure well. There were no complications. The patient received written and verbal post procedure care education. Post injection medications were not given.              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 type 2 diabetes mellitus (HCC) CSME superonasal to FAZ has recurred OS with good acuity preserved.  We will repeat injection today Avastin at 6-week interval month maintain 5 to 7-week interval OS  Vitreomacular adhesion of left eye Some ongoing tractional change in and around the fovea and also superonasal monitor closely  Macular pucker, left eye Minor OS currently  Central retinal vein occlusion with macular edema of right eye Original because of profound massive CME and now with residual central acuity impact the absence of active CME today     ICD-10-CM   1. 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 Vitreomacular adhesion of left eye  H43.822     3. Macular pucker, left eye  H35.372     4. Central retinal vein occlusion with macular edema of right eye  H34.8110       1.  OD history of CRV O. May 2021 onset with massive CME centrally.  Now completely resolved CME yet with residual visual acuity impact.  Continue to follow-up as scheduled OD  2.  OS with a history of center involved CSME now improving yet with noncentral involved CSME superonasal.  Some element of vitreous traction may be ongoing here as well.  Recent PRP to decrease vegF load completed  we will repeat injection Avastin OS today and follow-up again in 5 weeks to resolve this new CSME superonasal 3.  Ophthalmic Meds Ordered this visit:  Meds ordered this encounter  Medications   bevacizumab (AVASTIN) SOSY 2.5 mg       Return in about 5 weeks (around 01/19/2021) for dilate, OS, AVASTIN OCT,, and follow-up OD as scheduled.  There are no Patient Instructions on file for this visit.   Explained the diagnoses, plan, and follow up with the patient and they expressed understanding.  Patient expressed understanding of the importance of proper follow up care.   Alford Highland Texie Tupou M.D. Diseases & Surgery of the Retina and Vitreous [facility] 12/15/20     Abbreviations: M myopia (nearsighted); A astigmatism; H hyperopia (farsighted); P presbyopia; Mrx spectacle prescription;  CTL contact lenses; OD right eye; OS left eye; OU both eyes  XT exotropia; ET esotropia; PEK punctate epithelial keratitis; PEE punctate epithelial erosions; DES dry eye syndrome; MGD meibomian gland dysfunction; ATs artificial tears; PFAT's preservative free artificial tears; NSC nuclear sclerotic cataract; PSC posterior subcapsular cataract; ERM epi-retinal membrane; PVD posterior vitreous detachment; RD retinal detachment; DM diabetes mellitus; DR diabetic retinopathy; NPDR non-proliferative diabetic retinopathy; PDR proliferative diabetic retinopathy; CSME clinically significant macular edema; DME diabetic macular edema; dbh dot blot hemorrhages; CWS cotton wool spot;  POAG primary open angle glaucoma; C/D cup-to-disc ratio; HVF humphrey visual field; GVF goldmann visual field; OCT optical coherence tomography; IOP intraocular pressure; BRVO Branch retinal vein occlusion; CRVO central retinal vein occlusion; CRAO central retinal artery occlusion; BRAO branch retinal artery occlusion; RT retinal tear; SB scleral buckle; PPV pars plana vitrectomy; VH Vitreous hemorrhage; PRP panretinal laser  photocoagulation; IVK intravitreal kenalog; VMT vitreomacular traction; MH Macular hole;  NVD neovascularization of the disc; NVE neovascularization elsewhere; AREDS age related eye disease study; ARMD age related macular degeneration; POAG primary open angle glaucoma; EBMD epithelial/anterior basement membrane dystrophy; ACIOL anterior chamber intraocular lens; IOL intraocular lens; PCIOL posterior chamber intraocular lens; Phaco/IOL phacoemulsification with intraocular lens placement; PRK photorefractive keratectomy; LASIK laser assisted in situ keratomileusis; HTN hypertension; DM diabetes mellitus; COPD chronic obstructive pulmonary disease

## 2020-12-15 NOTE — Assessment & Plan Note (Signed)
Minor OS currently

## 2020-12-15 NOTE — Assessment & Plan Note (Signed)
CSME superonasal to FAZ has recurred OS with good acuity preserved.  We will repeat injection today Avastin at 6-week interval month maintain 5 to 7-week interval OS

## 2020-12-15 NOTE — Assessment & Plan Note (Signed)
Some ongoing tractional change in and around the fovea and also superonasal monitor closely

## 2020-12-15 NOTE — Assessment & Plan Note (Signed)
Original because of profound massive CME and now with residual central acuity impact the absence of active CME today

## 2020-12-23 ENCOUNTER — Other Ambulatory Visit: Payer: Self-pay

## 2020-12-23 ENCOUNTER — Encounter (INDEPENDENT_AMBULATORY_CARE_PROVIDER_SITE_OTHER): Payer: Self-pay | Admitting: Ophthalmology

## 2020-12-23 ENCOUNTER — Ambulatory Visit (INDEPENDENT_AMBULATORY_CARE_PROVIDER_SITE_OTHER): Payer: Medicare Other | Admitting: Ophthalmology

## 2020-12-23 DIAGNOSIS — E113512 Type 2 diabetes mellitus with proliferative diabetic retinopathy with macular edema, left eye: Secondary | ICD-10-CM | POA: Diagnosis not present

## 2020-12-23 DIAGNOSIS — H34811 Central retinal vein occlusion, right eye, with macular edema: Secondary | ICD-10-CM

## 2020-12-23 DIAGNOSIS — H401132 Primary open-angle glaucoma, bilateral, moderate stage: Secondary | ICD-10-CM

## 2020-12-23 NOTE — Assessment & Plan Note (Signed)
Post PRP #1, for PRP #2 today

## 2020-12-23 NOTE — Assessment & Plan Note (Signed)
Improved post recent injection Avastin's, follow-up next week as scheduled OD

## 2020-12-23 NOTE — Progress Notes (Signed)
12/23/2020     CHIEF COMPLAINT Patient presents for  Chief Complaint  Patient presents with   Retina Follow Up      HISTORY OF PRESENT ILLNESS: Ann Gomez is a 70 y.o. female who presents to the clinic today for:   HPI     Retina Follow Up   Patient presents with  Diabetic Retinopathy.  In left eye.  This started 4 weeks ago.  Severity is mild.  Duration of 4 weeks.        Comments   4 week prp os Pt states, "My vision is good and about the same since I got my injection last week. I am seeing a few more floaters in my left eye." Pt reports using Cosopt BID OU and Latanoprost QHS OU LBS: 148      Last edited by Demetrios Loll, COA on 12/23/2020  8:39 AM.      Referring physician: Sherral Hammers, FNP [phone] [address] [place],  [place] [address]  HISTORICAL INFORMATION:   Selected notes from the MEDICAL RECORD NUMBER    Lab Results  Component Value Date   HGBA1C 9.6 (H) 06/08/2009     CURRENT MEDICATIONS: Current Outpatient Medications (Ophthalmic Drugs)  Medication Sig   dorzolamide-timolol (COSOPT) 22.3-6.8 MG/ML ophthalmic solution 1 drop 2 (two) times daily.   latanoprost (XALATAN) 0.005 % ophthalmic solution 1 drop daily.   No current facility-administered medications for this visit. (Ophthalmic Drugs)   Current Outpatient Medications (Other)  Medication Sig   amLODipine (NORVASC) 10 MG tablet Take 10 mg by mouth daily.   aspirin EC 81 MG tablet Take 1 tablet (81 mg total) by mouth daily.   benazepril (LOTENSIN) 40 MG tablet Take 1 tablet (40 mg total) by mouth daily.   furosemide (LASIX) 40 MG tablet Take 1 tablet (40 mg total) by mouth daily.   insulin glargine (LANTUS) 100 UNIT/ML injection Inject 30 Units into the skin every morning. 25 units in the pm   loperamide (IMODIUM) 2 MG capsule Take 1 capsule (2 mg total) by mouth 2 (two) times daily as needed for diarrhea or loose stools.   losartan (COZAAR) 100 MG tablet Take 100 mg by mouth  daily.   metFORMIN (GLUCOPHAGE) 1000 MG tablet Take 1 tablet (1,000 mg total) by mouth 2 (two) times daily with a meal.   metoprolol (LOPRESSOR) 50 MG tablet Take 1 tablet (50 mg total) by mouth 2 (two) times daily.   ondansetron (ZOFRAN) 4 MG tablet Take 1 tablet (4 mg total) by mouth every 8 (eight) hours as needed for nausea or vomiting.   pravastatin (PRAVACHOL) 20 MG tablet Take 20 mg by mouth at bedtime.   terazosin (HYTRIN) 1 MG capsule Take 2 capsules (2 mg total) by mouth at bedtime.   No current facility-administered medications for this visit. (Other)      REVIEW OF SYSTEMS:    ALLERGIES Allergies  Allergen Reactions   Hydrochlorothiazide Other (See Comments)    dizzy    PAST MEDICAL HISTORY Past Medical History:  Diagnosis Date   Diabetes mellitus    Edema    Essential hypertension, benign    Hypertension    Obesity, unspecified    Past Surgical History:  Procedure Laterality Date   CESAREAN SECTION      FAMILY HISTORY Family History  Family history unknown: Yes    SOCIAL HISTORY Social History   Tobacco Use   Smoking status: Never   Smokeless tobacco: Never  Substance Use Topics   Alcohol use: No   Drug use: No         OPHTHALMIC EXAM:  Base Eye Exam     Visual Acuity (ETDRS)       Right Left   Dist [place] 20/80 -2 20/30 -1   Dist ph [place] 20/70 20/25 -2         Tonometry (Tonopen, 8:42 AM)       Right Left   Pressure 18 19         Pupils       Pupils Dark Light Shape React APD   Right PERRL 4 4 Round Minimal None   Left PERRL 4 4 Round Minimal None         Visual Fields       Left Right    Full Full         Extraocular Movement       Right Left    Full Full         Neuro/Psych     Oriented x3: Yes   Mood/Affect: Normal         Dilation     Left eye: 1.0% Mydriacyl, 2.5% Phenylephrine @ 8:42 AM           Slit Lamp and Fundus Exam     External Exam       Right Left   External Normal Normal          Slit Lamp Exam       Right Left   Lids/Lashes Normal Normal   Conjunctiva/Sclera White and quiet White and quiet   Cornea Clear Clear   Anterior Chamber Deep and quiet Deep and quiet   Iris Round and reactive Round and reactive   Lens Centered posterior chamber intraocular lens Centered posterior chamber intraocular lens   Anterior Vitreous Normal Normal         Fundus Exam       Right Left   Posterior Vitreous  Normal   Disc  No active N/V, Vitt Pap traction of is clinically visible   C/D Ratio  0.45   Macula  Microaneurysms, Mild clinically significant macular edema   Vessels  PDR-active, white lines of retinal nonperfusion nasally and inferiorly on clinical exam   Periphery  Dot-blot hemorrhage            IMAGING AND PROCEDURES  Imaging and Procedures for 12/23/20  Panretinal Photocoagulation - OS - Left Eye       Anesthesia Topical anesthesia was used. Anesthetic medications included Proparacaine 0.5%.   Laser Information The type of laser was diode. The duration in seconds was 0.03. The spot size was 390 microns. Laser power was 200. Total spots was 935.   Post-op The patient tolerated the procedure well. There were no complications. The patient received written and verbal post procedure care education.   Notes Treatment delivered , superiorly, temporally and inferiorly             ASSESSMENT/PLAN:  Central retinal vein occlusion with macular edema of right eye Improved post recent injection Avastin's, follow-up next week as scheduled OD  Proliferative diabetic retinopathy of left eye with macular edema associated with type 2 diabetes mellitus (HCC) Post PRP #1, for PRP #2 today  Primary open angle glaucoma of both eyes, moderate stage Improved ocular pressure OD      ICD-10-CM   1. Proliferative diabetic retinopathy of left eye with macular edema associated with type 2 diabetes  mellitus (HCC)  T62.2633 Panretinal Photocoagulation  - OS - Left Eye    2. Central retinal vein occlusion with macular edema of right eye  H34.8110     3. Primary open angle glaucoma of both eyes, moderate stage  H40.1132       1.  PDR OS, needs completion PRP today as much as possible  2.  OS CRV O follow-up next week as scheduled  3.  Ophthalmic Meds Ordered this visit:  No orders of the defined types were placed in this encounter.      Return for As scheduled, dilate, OD, AVASTIN OCT.  There are no Patient Instructions on file for this visit.   Explained the diagnoses, plan, and follow up with the patient and they expressed understanding.  Patient expressed understanding of the importance of proper follow up care.   Alford Highland Kiley Solimine M.D. Diseases & Surgery of the Retina and Vitreous [facility] 12/23/20     Abbreviations: M myopia (nearsighted); A astigmatism; H hyperopia (farsighted); P presbyopia; Mrx spectacle prescription;  CTL contact lenses; OD right eye; OS left eye; OU both eyes  XT exotropia; ET esotropia; PEK punctate epithelial keratitis; PEE punctate epithelial erosions; DES dry eye syndrome; MGD meibomian gland dysfunction; ATs artificial tears; PFAT's preservative free artificial tears; NSC nuclear sclerotic cataract; PSC posterior subcapsular cataract; ERM epi-retinal membrane; PVD posterior vitreous detachment; RD retinal detachment; DM diabetes mellitus; DR diabetic retinopathy; NPDR non-proliferative diabetic retinopathy; PDR proliferative diabetic retinopathy; CSME clinically significant macular edema; DME diabetic macular edema; dbh dot blot hemorrhages; CWS cotton wool spot; POAG primary open angle glaucoma; C/D cup-to-disc ratio; HVF humphrey visual field; GVF goldmann visual field; OCT optical coherence tomography; IOP intraocular pressure; BRVO Branch retinal vein occlusion; CRVO central retinal vein occlusion; CRAO central retinal artery occlusion; BRAO branch retinal artery occlusion; RT  retinal tear; SB scleral buckle; PPV pars plana vitrectomy; VH Vitreous hemorrhage; PRP panretinal laser photocoagulation; IVK intravitreal kenalog; VMT vitreomacular traction; MH Macular hole;  NVD neovascularization of the disc; NVE neovascularization elsewhere; AREDS age related eye disease study; ARMD age related macular degeneration; POAG primary open angle glaucoma; EBMD epithelial/anterior basement membrane dystrophy; ACIOL anterior chamber intraocular lens; IOL intraocular lens; PCIOL posterior chamber intraocular lens; Phaco/IOL phacoemulsification with intraocular lens placement; PRK photorefractive keratectomy; LASIK laser assisted in situ keratomileusis; HTN hypertension; DM diabetes mellitus; COPD chronic obstructive pulmonary disease

## 2020-12-23 NOTE — Assessment & Plan Note (Signed)
Improved ocular pressure OD

## 2020-12-29 ENCOUNTER — Other Ambulatory Visit: Payer: Self-pay

## 2020-12-29 ENCOUNTER — Ambulatory Visit (INDEPENDENT_AMBULATORY_CARE_PROVIDER_SITE_OTHER): Payer: Medicare Other | Admitting: Ophthalmology

## 2020-12-29 ENCOUNTER — Encounter (INDEPENDENT_AMBULATORY_CARE_PROVIDER_SITE_OTHER): Payer: Self-pay | Admitting: Ophthalmology

## 2020-12-29 DIAGNOSIS — H34811 Central retinal vein occlusion, right eye, with macular edema: Secondary | ICD-10-CM

## 2020-12-29 DIAGNOSIS — E113511 Type 2 diabetes mellitus with proliferative diabetic retinopathy with macular edema, right eye: Secondary | ICD-10-CM

## 2020-12-29 DIAGNOSIS — E113512 Type 2 diabetes mellitus with proliferative diabetic retinopathy with macular edema, left eye: Secondary | ICD-10-CM

## 2020-12-29 MED ORDER — BEVACIZUMAB 2.5 MG/0.1ML IZ SOSY
2.5000 mg | PREFILLED_SYRINGE | INTRAVITREAL | Status: AC | PRN
  Administered 2020-12-29: 2.5 mg via INTRAVITREAL

## 2020-12-29 NOTE — Assessment & Plan Note (Signed)
Vastly improved macular edema as compared to onset of CRV O with CME May 2021.  Will repeat injection today at current interval of 6 weeks, and maintain examination into the left eye in 6 weeks again

## 2020-12-29 NOTE — Progress Notes (Signed)
12/29/2020     CHIEF COMPLAINT Patient presents for  Chief Complaint  Patient presents with   Retina Follow Up      HISTORY OF PRESENT ILLNESS: Ann Gomez is a 70 y.o. female who presents to the clinic today for:   HPI     Retina Follow Up   Patient presents with  Diabetic Retinopathy.  In right eye.  This started 6 weeks ago.  Severity is mild.  Duration of 6 weeks.  Since onset it is stable.        Comments   6 week fu OD and Avastin OD Pt states VA OU stable since last visit. Pt denies FOL, floaters, or ocular pain OU.  A1C:7.0 LBS:148 Pt reports using Cosopt BID OU and Latanoprost QHS OU       Last edited by Demetrios LollBusick, Erica L, COA on 12/29/2020  8:50 AM.      Referring physician: Sherral HammersWilliams, Alvesha A, FNP [address] [place] [id number]  HISTORICAL INFORMATION:   Selected notes from the MEDICAL RECORD NUMBER    Lab Results  Component Value Date   HGBA1C 9.6 (H) 06/08/2009     CURRENT MEDICATIONS: Current Outpatient Medications (Ophthalmic Drugs)  Medication Sig   dorzolamide-timolol (COSOPT) 22.3-6.8 MG/ML ophthalmic solution 1 drop 2 (two) times daily.   latanoprost (XALATAN) 0.005 % ophthalmic solution 1 drop daily.   No current facility-administered medications for this visit. (Ophthalmic Drugs)   Current Outpatient Medications (Other)  Medication Sig   amLODipine (NORVASC) 10 MG tablet Take 10 mg by mouth daily.   aspirin EC 81 MG tablet Take 1 tablet (81 mg total) by mouth daily.   benazepril (LOTENSIN) 40 MG tablet Take 1 tablet (40 mg total) by mouth daily.   furosemide (LASIX) 40 MG tablet Take 1 tablet (40 mg total) by mouth daily.   insulin glargine (LANTUS) 100 UNIT/ML injection Inject 30 Units into the skin every morning. 25 units in the pm   loperamide (IMODIUM) 2 MG capsule Take 1 capsule (2 mg total) by mouth 2 (two) times daily as needed for diarrhea or loose stools.   losartan (COZAAR) 100 MG tablet Take 100 mg by  mouth daily.   metFORMIN (GLUCOPHAGE) 1000 MG tablet Take 1 tablet (1,000 mg total) by mouth 2 (two) times daily with a meal.   metoprolol (LOPRESSOR) 50 MG tablet Take 1 tablet (50 mg total) by mouth 2 (two) times daily.   ondansetron (ZOFRAN) 4 MG tablet Take 1 tablet (4 mg total) by mouth every 8 (eight) hours as needed for nausea or vomiting.   pravastatin (PRAVACHOL) 20 MG tablet Take 20 mg by mouth at bedtime.   terazosin (HYTRIN) 1 MG capsule Take 2 capsules (2 mg total) by mouth at bedtime.   No current facility-administered medications for this visit. (Other)      REVIEW OF SYSTEMS:    ALLERGIES Allergies  Allergen Reactions   Hydrochlorothiazide Other (See Comments)    dizzy    PAST MEDICAL HISTORY Past Medical History:  Diagnosis Date   Diabetes mellitus    Edema    Essential hypertension, benign    Hypertension    Obesity, unspecified    Past Surgical History:  Procedure Laterality Date   CESAREAN SECTION      FAMILY HISTORY Family History  Family history unknown: Yes    SOCIAL HISTORY Social History   Tobacco Use   Smoking status: Never   Smokeless tobacco: Never  Substance Use Topics   Alcohol use: No   Drug use: No         OPHTHALMIC EXAM:  Base Eye Exam     Visual Acuity (ETDRS)       Right Left   Dist [place] 20/100 20/40   Dist ph [place] 20/70 -1 20/30         Tonometry (Tonopen, 8:55 AM)       Right Left   Pressure 29 21         Tonometry #2 (Tonopen, 8:55 AM)       Right Left   Pressure 28 22         Pupils       Pupils Dark Light Shape React APD   Right PERRL 4 4 Round Minimal None   Left PERRL 4 4 Round Minimal None         Visual Fields (Counting fingers)       Left Right    Full Full         Extraocular Movement       Right Left    Full Full         Neuro/Psych     Oriented x3: Yes   Mood/Affect: Normal         Dilation     Right eye:            Slit Lamp and Fundus Exam      External Exam       Right Left   External Normal Normal         Slit Lamp Exam       Right Left   Lids/Lashes Normal Normal   Conjunctiva/Sclera White and quiet White and quiet   Cornea Clear Clear   Anterior Chamber Deep and quiet Deep and quiet   Iris Round and reactive Round and reactive   Lens Centered posterior chamber intraocular lens Centered posterior chamber intraocular lens   Anterior Vitreous Normal Normal         Fundus Exam       Right Left   Posterior Vitreous Posterior vitreous detachment    Disc Normal    C/D Ratio 0.35 0.45   Macula no macular thickening, Intraretinal hemorrhage, Severe clinically significant macular edema, Microaneurysms    Vessels NPDR-Severe, and findings of intraretinal hemorrhages diffusely like incomplete central retinal vein occlusion with moderate enlargement of vein    Periphery Diffuse intraretinal hemorrhages peripherally             IMAGING AND PROCEDURES  Imaging and Procedures for 12/29/20  OCT, Retina - OU - Both Eyes       Right Eye Quality was good. Scan locations included subfoveal. Central Foveal Thickness: 189. Progression has been stable. Findings include abnormal foveal contour, cystoid macular edema.   Left Eye Quality was good. Scan locations included subfoveal. Central Foveal Thickness: 310. Findings include vitreomacular adhesion .   Notes OD vastly improved macular thickening and final resolution of serous fluid attachment beneath the fovea, at 6 weeks postinjection  OS with juxta foveal superonasal to FAZ involved CSME Stable at current interval postinjection intravitreal Avastin, 4 weeks 6-day postinjection OS and examination again as scheduled      Intravitreal Injection, Pharmacologic Agent - OD - Right Eye       Time Out 12/29/2020. 9:27 AM. Confirmed correct patient, procedure, site, and patient consented.   Anesthesia Topical anesthesia was used. Anesthetic medications included Akten  3.5%.   Procedure  Preparation included Tobramycin 0.3%, 10% betadine to eyelids, 5% betadine to ocular surface. A 30 gauge needle was used.   Injection: 2.5 mg bevacizumab 2.5 MG/0.1ML   Route: Intravitreal, Site: Right Eye   NDC: [id number], Lot: [id number]   Post-op Post injection exam found visual acuity of at least counting fingers. The patient tolerated the procedure well. There were no complications. The patient received written and verbal post procedure care education. Post injection medications were not given.              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 type 2 diabetes mellitus (HCC) OS post recent PRP to decrease vegF burden  Central retinal vein occlusion with macular edema of right eye Vastly improved macular edema as compared to onset of CRV O with CME May 2021.  Will repeat injection today at current interval of 6 weeks, and maintain examination into the left eye in 6 weeks again     ICD-10-CM   1. 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 Proliferative diabetic retinopathy of left eye with macular edema associated with type 2 diabetes mellitus (HCC)  I94.8546     3. Central retinal vein occlusion with macular edema of right eye  H34.8110       1.  OD vastly improved CRV O with CME on antivegF now at 6-week interval.  As compared to onset of the cell disorder with massive macular edema May 2021.  Repeat injection today to preserve acuity follow-up examination next again in 6 weeks  2.  OS with history of PDR, and now status post PRP 2 sessions to decrease vegF burden long-term.  3.  Ophthalmic Meds Ordered this visit:  Meds ordered this encounter  Medications   bevacizumab  (AVASTIN) SOSY 2.5 mg       Return in about 6 weeks (around 02/09/2021) for dilate, OD, AVASTIN OCT.  There are no Patient Instructions on file for this visit.   Explained the diagnoses, plan, and follow up with the patient and they expressed understanding.  Patient expressed understanding of the importance of proper follow up care.   Alford Highland Suri Tafolla M.D. Diseases & Surgery of the Retina and Vitreous [facility] 12/29/20     Abbreviations: M myopia (nearsighted); A astigmatism; H hyperopia (farsighted); P presbyopia; Mrx spectacle prescription;  CTL contact lenses; OD right eye; OS left eye; OU both eyes  XT exotropia; ET esotropia; PEK punctate epithelial keratitis; PEE punctate epithelial erosions; DES dry eye syndrome; MGD meibomian gland dysfunction; ATs artificial tears; PFAT's preservative free artificial tears; NSC nuclear sclerotic cataract; PSC posterior subcapsular cataract; ERM epi-retinal membrane; PVD posterior vitreous detachment; RD retinal detachment; DM diabetes mellitus; DR diabetic retinopathy; NPDR non-proliferative diabetic retinopathy; PDR proliferative diabetic retinopathy; CSME clinically significant macular edema; DME diabetic macular edema; dbh dot blot hemorrhages; CWS cotton wool spot; POAG primary open angle glaucoma; C/D cup-to-disc ratio; HVF humphrey visual field; GVF goldmann visual field; OCT optical coherence tomography; IOP intraocular pressure; BRVO Branch retinal vein occlusion; CRVO central retinal vein occlusion; CRAO central retinal artery occlusion; BRAO branch retinal artery occlusion; RT retinal tear; SB scleral buckle; PPV pars plana vitrectomy; VH Vitreous hemorrhage; PRP panretinal laser photocoagulation; IVK intravitreal  kenalog; VMT vitreomacular traction; MH Macular hole;  NVD neovascularization of the disc; NVE neovascularization elsewhere; AREDS age related eye disease study; ARMD age related macular degeneration; POAG primary  open angle glaucoma; EBMD epithelial/anterior basement membrane dystrophy; ACIOL anterior chamber intraocular lens; IOL intraocular lens; PCIOL posterior chamber intraocular lens; Phaco/IOL phacoemulsification with intraocular lens placement; PRK photorefractive keratectomy; LASIK laser assisted in situ keratomileusis; HTN hypertension; DM diabetes mellitus; COPD chronic obstructive pulmonary disease

## 2020-12-29 NOTE — Assessment & Plan Note (Signed)
OS post recent PRP to decrease vegF burden

## 2021-01-08 ENCOUNTER — Ambulatory Visit: Payer: Medicare Other | Admitting: Podiatry

## 2021-01-11 ENCOUNTER — Ambulatory Visit: Payer: Medicare Other | Admitting: Podiatry

## 2021-01-19 ENCOUNTER — Ambulatory Visit (INDEPENDENT_AMBULATORY_CARE_PROVIDER_SITE_OTHER): Payer: Medicare Other | Admitting: Ophthalmology

## 2021-01-19 ENCOUNTER — Encounter (INDEPENDENT_AMBULATORY_CARE_PROVIDER_SITE_OTHER): Payer: Self-pay | Admitting: Ophthalmology

## 2021-01-19 ENCOUNTER — Other Ambulatory Visit: Payer: Self-pay

## 2021-01-19 DIAGNOSIS — H43822 Vitreomacular adhesion, left eye: Secondary | ICD-10-CM | POA: Diagnosis not present

## 2021-01-19 DIAGNOSIS — H34811 Central retinal vein occlusion, right eye, with macular edema: Secondary | ICD-10-CM | POA: Diagnosis not present

## 2021-01-19 DIAGNOSIS — E113512 Type 2 diabetes mellitus with proliferative diabetic retinopathy with macular edema, left eye: Secondary | ICD-10-CM | POA: Diagnosis not present

## 2021-01-19 MED ORDER — BEVACIZUMAB 2.5 MG/0.1ML IZ SOSY
2.5000 mg | PREFILLED_SYRINGE | INTRAVITREAL | Status: AC | PRN
  Administered 2021-01-19: 2.5 mg via INTRAVITREAL

## 2021-01-19 NOTE — Assessment & Plan Note (Signed)
Atrophic macula OD may account for acuity now stabilized CME follow-up as scheduled

## 2021-01-19 NOTE — Assessment & Plan Note (Signed)
CSME OS overall improved yet still active

## 2021-01-19 NOTE — Progress Notes (Signed)
01/19/2021     CHIEF COMPLAINT Patient presents for  Chief Complaint  Patient presents with   Retina Follow Up      HISTORY OF PRESENT ILLNESS: Ann Gomez is a 70 y.o. female who presents to the clinic today for:   HPI     Retina Follow Up   Patient presents with  Diabetic Retinopathy.  In left eye.  This started 5 weeks ago.  Severity is mild.  Duration of 5 weeks.  Since onset it is stable.        Comments   5 week fu OS and Avastin OS and OCT, post last injection OS and 3 w  post PRP OS. Pt states VA OU stable since last visit. Pt denies FOL, floaters, or ocular pain OU.  Pt states, "everything seems to be good. I cannot tell any real difference." A1C: unknown LBS: 143 Pt reports that she is still using Cosopt and Latanoprost OU daily       Last edited by Edmon Crape, MD on 01/19/2021  9:27 AM.      Referring physician: Sherral Hammers, FNP [phone] [place],  [place] [address]  HISTORICAL INFORMATION:   Selected notes from the MEDICAL RECORD NUMBER    Lab Results  Component Value Date   HGBA1C 9.6 (H) 06/08/2009     CURRENT MEDICATIONS: Current Outpatient Medications (Ophthalmic Drugs)  Medication Sig   dorzolamide-timolol (COSOPT) 22.3-6.8 MG/ML ophthalmic solution 1 drop 2 (two) times daily.   latanoprost (XALATAN) 0.005 % ophthalmic solution 1 drop daily.   No current facility-administered medications for this visit. (Ophthalmic Drugs)   Current Outpatient Medications (Other)  Medication Sig   amLODipine (NORVASC) 10 MG tablet Take 10 mg by mouth daily.   aspirin EC 81 MG tablet Take 1 tablet (81 mg total) by mouth daily.   benazepril (LOTENSIN) 40 MG tablet Take 1 tablet (40 mg total) by mouth daily.   furosemide (LASIX) 40 MG tablet Take 1 tablet (40 mg total) by mouth daily.   insulin glargine (LANTUS) 100 UNIT/ML injection Inject 30 Units into the skin every morning. 25 units in the pm   loperamide (IMODIUM) 2 MG capsule  Take 1 capsule (2 mg total) by mouth 2 (two) times daily as needed for diarrhea or loose stools.   losartan (COZAAR) 100 MG tablet Take 100 mg by mouth daily.   metFORMIN (GLUCOPHAGE) 1000 MG tablet Take 1 tablet (1,000 mg total) by mouth 2 (two) times daily with a meal.   metoprolol (LOPRESSOR) 50 MG tablet Take 1 tablet (50 mg total) by mouth 2 (two) times daily.   ondansetron (ZOFRAN) 4 MG tablet Take 1 tablet (4 mg total) by mouth every 8 (eight) hours as needed for nausea or vomiting.   pravastatin (PRAVACHOL) 20 MG tablet Take 20 mg by mouth at bedtime.   terazosin (HYTRIN) 1 MG capsule Take 2 capsules (2 mg total) by mouth at bedtime.   No current facility-administered medications for this visit. (Other)      REVIEW OF SYSTEMS:    ALLERGIES Allergies  Allergen Reactions   Hydrochlorothiazide Other (See Comments)    dizzy    PAST MEDICAL HISTORY Past Medical History:  Diagnosis Date   Diabetes mellitus    Edema    Essential hypertension, benign    Hypertension    Obesity, unspecified    Past Surgical History:  Procedure Laterality Date   CESAREAN SECTION      FAMILY  HISTORY Family History  Family history unknown: Yes    SOCIAL HISTORY Social History   Tobacco Use   Smoking status: Never   Smokeless tobacco: Never  Substance Use Topics   Alcohol use: No   Drug use: No         OPHTHALMIC EXAM:  Base Eye Exam     Visual Acuity (ETDRS)       Right Left   Dist [place] 20/100 -1 20/50 -1   Dist ph [place] 20/70 20/40 +2         Tonometry (Tonopen, 9:27 AM)       Right Left   Pressure 18 17         Pupils       Pupils Dark Light Shape React APD   Right PERRL 4 4 Round Minimal None   Left PERRL 4 4 Round Minimal None         Visual Fields (Counting fingers)       Left Right    Full Full         Extraocular Movement       Right Left    Full Full         Neuro/Psych     Oriented x3: Yes   Mood/Affect: Normal          Dilation     Left eye: 1.0% Mydriacyl, 2.5% Phenylephrine @ 8:56 AM           Slit Lamp and Fundus Exam     External Exam       Right Left   External Normal Normal         Slit Lamp Exam       Right Left   Lids/Lashes Normal Normal   Conjunctiva/Sclera White and quiet White and quiet   Cornea Clear Clear   Anterior Chamber Deep and quiet Deep and quiet   Iris Round and reactive Round and reactive   Lens Centered posterior chamber intraocular lens Centered posterior chamber intraocular lens   Anterior Vitreous Normal Normal         Fundus Exam       Right Left   Posterior Vitreous  Normal   Disc  No active N/V, Vitt Pap traction of is clinically visible   C/D Ratio  0.45   Macula  Microaneurysms, Mild clinically significant macular edema   Vessels  PDR-active, white lines of retinal nonperfusion nasally and inferiorly on clinical exam   Periphery  Dot-blot hemorrhage            IMAGING AND PROCEDURES  Imaging and Procedures for 01/19/21  OCT, Retina - OU - Both Eyes       Right Eye Quality was good. Scan locations included subfoveal. Central Foveal Thickness: 194. Progression has been stable. Findings include abnormal foveal contour, cystoid macular edema.   Left Eye Quality was good. Scan locations included subfoveal. Central Foveal Thickness: 348. Progression has been stable. Findings include vitreomacular adhesion , abnormal foveal contour, vitreous traction.   Notes OD vastly improved macular thickening and final resolution of serous fluid attachment beneath the fovea, at 6 weeks postinjection  OS with juxta foveal superonasal to FAZ involved CSME Stable at current interval postinjection intravitreal Avastin, currently at 5 weeks postinjection, vitreomacular and vitreal Retinal traction are present particularly juxtapapillary in the region of the p.m. bundle potentially contributing to the schisis and/or macular edema there with foveal macular  traction as well.     Intravitreal Injection, Pharmacologic  Agent - OS - Left Eye       Time Out 01/19/2021. 9:28 AM. Confirmed correct patient, procedure, site, and patient consented.   Anesthesia Topical anesthesia was used. Anesthetic medications included Akten 3.5%.   Procedure Preparation included Tobramycin 0.3%, 10% betadine to eyelids, 5% betadine to ocular surface. A 30 gauge needle was used.   Injection: 2.5 mg bevacizumab 2.5 MG/0.1ML   Route: Intravitreal, Site: Left Eye   NDC: [phone], Lot: [id number]   Post-op Post injection exam found visual acuity of at least counting fingers. The patient tolerated the procedure well. There were no complications. The patient received written and verbal post procedure care education. Post injection medications included ocuflox.              ASSESSMENT/PLAN:  Vitreomacular adhesion of left eye Some role in ongoing vitreomacular traction nasally over the p.m. bundle may be affecting this anatomy as well as at the fovea with cystoid change.  May have to address this condition surgically in the future  Proliferative diabetic retinopathy of left eye with macular edema associated with type 2 diabetes mellitus (HCC) CSME OS overall improved yet still active  Central retinal vein occlusion with macular edema of right eye Atrophic macula OD may account for acuity now stabilized CME follow-up as scheduled     ICD-10-CM   1. 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 Vitreomacular adhesion of left eye  H43.822     3. Central retinal vein occlusion with macular edema of right eye  H34.8110       1.  OS overall improved CSME that still with vitreal foveal traction as well as traction on the p.m. bundle contributing  to the macular thickening.  Repeat injection today and reassess.  If CSME improves or resolves no attention to the tractional forces required.  Otherwise might need to have surgical intervention left eye to reduce the tractional forces  2.  OD on a different follow-up schedule due to underlying CRV O some 1.5 years in duration.  No CME today.  Follow-up as scheduled possible injection at that time  3.  Ophthalmic Meds Ordered this visit:  Meds ordered this encounter  Medications   bevacizumab (AVASTIN) SOSY 2.5 mg       Return in about 5 weeks (around 02/23/2021) for dilate, OS, AVASTIN OCT,, and follow-up OD as scheduled possible Avastin OD.  There are no Patient Instructions on file for this visit.   Explained the diagnoses, plan, and follow up with the patient and they expressed understanding.  Patient expressed understanding of the importance of proper follow up care.   Alford Highland Jahrel Borthwick M.D. Diseases & Surgery of the Retina and Vitreous [facility] 01/19/21     Abbreviations: M myopia (nearsighted); A astigmatism; H hyperopia (farsighted); P presbyopia; Mrx spectacle prescription;  CTL contact lenses; OD right eye; OS left eye; OU both eyes  XT exotropia; ET esotropia; PEK punctate epithelial keratitis; PEE punctate epithelial erosions; DES dry eye syndrome; MGD meibomian gland dysfunction; ATs artificial tears; PFAT's preservative free artificial tears; NSC nuclear sclerotic cataract; PSC posterior subcapsular cataract; ERM epi-retinal membrane; PVD posterior vitreous detachment; RD retinal detachment; DM diabetes mellitus; DR diabetic retinopathy; NPDR non-proliferative diabetic retinopathy; PDR proliferative diabetic  retinopathy; CSME clinically significant macular edema; DME diabetic macular edema; dbh dot blot hemorrhages; CWS cotton wool spot; POAG primary open angle glaucoma; C/D cup-to-disc ratio; HVF humphrey visual field; GVF goldmann visual field; OCT  optical coherence tomography; IOP intraocular pressure; BRVO Branch retinal vein occlusion; CRVO central retinal vein occlusion; CRAO central retinal artery occlusion; BRAO branch retinal artery occlusion; RT retinal tear; SB scleral buckle; PPV pars plana vitrectomy; VH Vitreous hemorrhage; PRP panretinal laser photocoagulation; IVK intravitreal kenalog; VMT vitreomacular traction; MH Macular hole;  NVD neovascularization of the disc; NVE neovascularization elsewhere; AREDS age related eye disease study; ARMD age related macular degeneration; POAG primary open angle glaucoma; EBMD epithelial/anterior basement membrane dystrophy; ACIOL anterior chamber intraocular lens; IOL intraocular lens; PCIOL posterior chamber intraocular lens; Phaco/IOL phacoemulsification with intraocular lens placement; [place] photorefractive keratectomy; LASIK laser assisted in situ keratomileusis; HTN hypertension; DM diabetes mellitus; COPD chronic obstructive pulmonary disease

## 2021-01-19 NOTE — Assessment & Plan Note (Signed)
Some role in ongoing vitreomacular traction nasally over the p.m. bundle may be affecting this anatomy as well as at the fovea with cystoid change.  May have to address this condition surgically in the future

## 2021-01-25 ENCOUNTER — Ambulatory Visit: Payer: Medicare Other | Admitting: Podiatry

## 2021-02-09 ENCOUNTER — Encounter (INDEPENDENT_AMBULATORY_CARE_PROVIDER_SITE_OTHER): Payer: Medicare Other | Admitting: Ophthalmology

## 2021-02-17 ENCOUNTER — Ambulatory Visit (INDEPENDENT_AMBULATORY_CARE_PROVIDER_SITE_OTHER): Payer: Medicare Other | Admitting: Ophthalmology

## 2021-02-17 ENCOUNTER — Other Ambulatory Visit: Payer: Self-pay

## 2021-02-17 ENCOUNTER — Encounter (INDEPENDENT_AMBULATORY_CARE_PROVIDER_SITE_OTHER): Payer: Self-pay | Admitting: Ophthalmology

## 2021-02-17 DIAGNOSIS — E113512 Type 2 diabetes mellitus with proliferative diabetic retinopathy with macular edema, left eye: Secondary | ICD-10-CM

## 2021-02-17 DIAGNOSIS — H43822 Vitreomacular adhesion, left eye: Secondary | ICD-10-CM | POA: Diagnosis not present

## 2021-02-17 DIAGNOSIS — E113511 Type 2 diabetes mellitus with proliferative diabetic retinopathy with macular edema, right eye: Secondary | ICD-10-CM | POA: Diagnosis not present

## 2021-02-17 DIAGNOSIS — H401131 Primary open-angle glaucoma, bilateral, mild stage: Secondary | ICD-10-CM

## 2021-02-17 NOTE — Assessment & Plan Note (Signed)
Macular edema OS quiescent, VMT however is significant

## 2021-02-17 NOTE — Assessment & Plan Note (Signed)
OS with severe significant vitreomacular adhesion and traction with secondary CSME may need vitrectomy to release his traction left eye

## 2021-02-17 NOTE — Assessment & Plan Note (Signed)
Macular edema the right eye is completely quiescent , Nonetheless we will continue to monitor n

## 2021-02-17 NOTE — Progress Notes (Signed)
02/17/2021     CHIEF COMPLAINT Patient presents for  Chief Complaint  Patient presents with   Retina Follow Up      HISTORY OF PRESENT ILLNESS: Ann Gomez is a 70 y.o. female who presents to the clinic today for:   HPI     Retina Follow Up   Patient presents with  Diabetic Retinopathy.  In right eye.  This started 7 weeks ago.  Severity is mild.  Duration of 7 weeks.  Since onset it is stable.        Comments   7 week fu od oct and avastin od Pt states [facility] OU stable since last visit. Pt denies FOL, floaters, or ocular pain OU.  Pt reports that she is not having any new issues.  A1C: Does not know LBS: 143 Pt reports using Cosopt BID OU and Latanoprost QHS OU       Last edited by Demetrios Loll, COA on 02/17/2021  8:36 AM.      Referring physician: Ernesto Rutherford, MD [address] [place],  [place] [address]  HISTORICAL INFORMATION:   Selected notes from the MEDICAL RECORD NUMBER    Lab Results  Component Value Date   HGBA1C 9.6 (H) 06/08/2009     CURRENT MEDICATIONS: Current Outpatient Medications (Ophthalmic Drugs)  Medication Sig   dorzolamide-timolol (COSOPT) 22.3-6.8 MG/ML ophthalmic solution 1 drop 2 (two) times daily.   latanoprost (XALATAN) 0.005 % ophthalmic solution 1 drop daily.   No current facility-administered medications for this visit. (Ophthalmic Drugs)   Current Outpatient Medications (Other)  Medication Sig   amLODipine (NORVASC) 10 MG tablet Take 10 mg by mouth daily.   aspirin EC 81 MG tablet Take 1 tablet (81 mg total) by mouth daily.   benazepril (LOTENSIN) 40 MG tablet Take 1 tablet (40 mg total) by mouth daily.   furosemide (LASIX) 40 MG tablet Take 1 tablet (40 mg total) by mouth daily.   insulin glargine (LANTUS) 100 UNIT/ML injection Inject 30 Units into the skin every morning. 25 units in the pm   loperamide (IMODIUM) 2 MG capsule Take 1 capsule (2 mg total) by mouth 2 (two) times daily as needed for diarrhea or  loose stools.   losartan (COZAAR) 100 MG tablet Take 100 mg by mouth daily.   metFORMIN (GLUCOPHAGE) 1000 MG tablet Take 1 tablet (1,000 mg total) by mouth 2 (two) times daily with a meal.   metoprolol (LOPRESSOR) 50 MG tablet Take 1 tablet (50 mg total) by mouth 2 (two) times daily.   ondansetron (ZOFRAN) 4 MG tablet Take 1 tablet (4 mg total) by mouth every 8 (eight) hours as needed for nausea or vomiting.   pravastatin (PRAVACHOL) 20 MG tablet Take 20 mg by mouth at bedtime.   terazosin (HYTRIN) 1 MG capsule Take 2 capsules (2 mg total) by mouth at bedtime.   No current facility-administered medications for this visit. (Other)      REVIEW OF SYSTEMS:    ALLERGIES Allergies  Allergen Reactions   Hydrochlorothiazide Other (See Comments)    dizzy    PAST MEDICAL HISTORY Past Medical History:  Diagnosis Date   Diabetes mellitus    Edema    Essential hypertension, benign    Hypertension    Obesity, unspecified    Past Surgical History:  Procedure Laterality Date   CESAREAN SECTION      FAMILY HISTORY Family History  Family history unknown: Yes    SOCIAL HISTORY  Social History   Tobacco Use   Smoking status: Never   Smokeless tobacco: Never  Substance Use Topics   Alcohol use: No   Drug use: No         OPHTHALMIC EXAM:  Base Eye Exam     Visual Acuity (ETDRS)       Right Left   Dist [place] 20/100 -1 20/30 -2   Dist ph [place] NI NI         Tonometry (Tonopen, 8:41 AM)       Right Left   Pressure 33 26         Tonometry #2 (Tonopen, 8:41 AM)       Right Left   Pressure 32          Pupils       Pupils Dark Light Shape React APD   Right PERRL 3 3 Round Minimal None   Left PERRL 3 3 Round Minimal None         Visual Fields (Counting fingers)       Left Right    Full Full         Extraocular Movement       Right Left    Full Full         Neuro/Psych     Oriented x3: Yes   Mood/Affect: Normal         Dilation      Both eyes: 1.0% Mydriacyl, 2.5% Phenylephrine @ 8:41 AM           Slit Lamp and Fundus Exam     External Exam       Right Left   External Normal Normal         Slit Lamp Exam       Right Left   Lids/Lashes Normal Normal   Conjunctiva/Sclera White and quiet White and quiet   Cornea Clear Clear   Anterior Chamber Deep and quiet Deep and quiet   Iris Round and reactive Round and reactive   Lens Centered posterior chamber intraocular lens Centered posterior chamber intraocular lens   Anterior Vitreous Normal Normal         Fundus Exam       Right Left   Posterior Vitreous Posterior vitreous detachment Normal   Disc Normal No active N/V, Vitt Pap traction of is clinically visible   C/D Ratio 0.35 0.45   Macula no macular thickening, Intraretinal hemorrhage, Severe clinically significant macular edema, Microaneurysms Microaneurysms, Mild clinically significant macular edema   Vessels NPDR-Severe, and findings of intraretinal hemorrhages diffusely like incomplete central retinal vein occlusion with moderate enlargement of vein PDR-active, white lines of retinal nonperfusion nasally and inferiorly on clinical exam   Periphery Diffuse intraretinal hemorrhages peripherally Dot-blot hemorrhage            IMAGING AND PROCEDURES  Imaging and Procedures for 02/17/21  OCT, Retina - OU - Both Eyes       Right Eye Quality was good. Scan locations included subfoveal. Central Foveal Thickness: 193. Progression has been stable. Findings include abnormal foveal contour, cystoid macular edema.   Left Eye Quality was good. Scan locations included subfoveal. Central Foveal Thickness: 333. Progression has been stable. Findings include vitreomacular adhesion , abnormal foveal contour, vitreous traction.   Notes OD vastly improved macular thickening and final resolution of serous fluid attachment beneath the fovea, at 7 weeks postinjection, now with intraocular pressure elevation  will not treat today  OS with juxta foveal superonasal to  FAZ involved CSME Stable at current interval postinjection intravitreal Avastin, currently at 4 weeks postinjection, vitreomacular and vitreal Retinal traction are present particularly juxtapapillary in the region of the p.m. bundle potentially contributing to the schisis and/or macular edema there with foveal macular traction as well.             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 type 2 diabetes mellitus (HCC) Macular edema OS quiescent, VMT however is significant  Diabetic macular edema of right eye with proliferative retinopathy associated with type 2 diabetes mellitus (HCC) Macular edema the right eye is completely quiescent , Nonetheless we will continue to monitor n  Vitreomacular adhesion of left eye OS with severe significant vitreomacular adhesion and traction with secondary CSME may need vitrectomy to release his traction left eye     ICD-10-CM   1. Proliferative diabetic retinopathy of right eye with macular edema associated with type 2 diabetes mellitus (HCC)  E11.3511 OCT, Retina - OU - Both Eyes    CANCELED: Intravitreal Injection, Pharmacologic Agent - OD - Right Eye    2. Primary open angle glaucoma of both eyes, mild stage  H40.1131     3. Proliferative diabetic retinopathy of left eye with macular edema associated with type 2 diabetes mellitus (HCC)  B34.1937     4. Diabetic macular edema of right eye with proliferative retinopathy associated with type 2 diabetes mellitus (HCC)  E11.3511     5. Vitreomacular adhesion of left eye  H43.822       1.  Primary open-angle glaucoma likely with secondary elevation with chronic antivegF use.  Will need follow-up with Dr. Ernesto Rutherford for adequate control of intraocular pressure in each eye.  We will not deliver antivegF medications today  2.  CSME OD has settled nicely and is not active OD thus  will observe OD today no injection  3.  OS with foveal elevation from secondary vitreomacular traction adhesion may need vitrectomy in the future.  In the meantime follow-up with Dr. Ernesto Rutherford for adequate control of intraocular pressures  Ophthalmic Meds Ordered this visit:  No orders of the defined types were placed in this encounter.      No follow-ups on file.  There are no Patient Instructions on file for this visit.   Explained the diagnoses, plan, and follow up with the patient and they expressed understanding.  Patient expressed understanding of the importance of proper follow up care.   Alford Highland Rayshawn Maney M.D. Diseases & Surgery of the Retina and Vitreous [facility] 02/17/21     Abbreviations: M myopia (nearsighted); A astigmatism; H hyperopia (farsighted); P presbyopia; Mrx spectacle prescription;  CTL contact lenses; OD right eye; OS left eye; OU both eyes  XT exotropia; ET esotropia; PEK punctate epithelial keratitis; PEE punctate epithelial erosions; DES dry eye syndrome; MGD meibomian gland dysfunction; ATs artificial tears; PFAT's preservative free artificial tears; NSC nuclear sclerotic cataract; PSC posterior subcapsular cataract; ERM epi-retinal membrane; PVD posterior vitreous detachment; RD retinal detachment; DM diabetes mellitus; DR diabetic retinopathy; NPDR non-proliferative diabetic retinopathy; PDR proliferative diabetic retinopathy; CSME clinically significant macular edema; DME diabetic macular edema; dbh dot blot hemorrhages; CWS cotton wool spot; POAG primary open angle glaucoma; C/D cup-to-disc ratio; HVF humphrey visual field; GVF goldmann visual field; OCT optical coherence tomography; IOP intraocular pressure; BRVO Branch retinal vein occlusion; CRVO central retinal vein occlusion; CRAO central retinal artery occlusion; BRAO branch retinal artery occlusion; RT retinal  tear; SB scleral buckle; PPV pars plana vitrectomy; VH Vitreous  hemorrhage; PRP panretinal laser photocoagulation; IVK intravitreal kenalog; VMT vitreomacular traction; MH Macular hole;  NVD neovascularization of the disc; NVE neovascularization elsewhere; AREDS age related eye disease study; ARMD age related macular degeneration; POAG primary open angle glaucoma; EBMD epithelial/anterior basement membrane dystrophy; ACIOL anterior chamber intraocular lens; IOL intraocular lens; PCIOL posterior chamber intraocular lens; Phaco/IOL phacoemulsification with intraocular lens placement; PRK photorefractive keratectomy; LASIK laser assisted in situ keratomileusis; HTN hypertension; DM diabetes mellitus; COPD chronic obstructive pulmonary disease

## 2021-02-23 ENCOUNTER — Encounter (INDEPENDENT_AMBULATORY_CARE_PROVIDER_SITE_OTHER): Payer: Medicare Other | Admitting: Ophthalmology

## 2021-02-24 ENCOUNTER — Encounter (INDEPENDENT_AMBULATORY_CARE_PROVIDER_SITE_OTHER): Payer: Self-pay | Admitting: Ophthalmology

## 2021-02-24 ENCOUNTER — Ambulatory Visit (INDEPENDENT_AMBULATORY_CARE_PROVIDER_SITE_OTHER): Payer: Medicare Other | Admitting: Ophthalmology

## 2021-02-24 ENCOUNTER — Other Ambulatory Visit: Payer: Self-pay

## 2021-02-24 DIAGNOSIS — E113512 Type 2 diabetes mellitus with proliferative diabetic retinopathy with macular edema, left eye: Secondary | ICD-10-CM | POA: Diagnosis not present

## 2021-02-24 DIAGNOSIS — H34811 Central retinal vein occlusion, right eye, with macular edema: Secondary | ICD-10-CM | POA: Diagnosis not present

## 2021-02-24 DIAGNOSIS — H43822 Vitreomacular adhesion, left eye: Secondary | ICD-10-CM | POA: Diagnosis not present

## 2021-02-24 MED ORDER — BEVACIZUMAB 2.5 MG/0.1ML IZ SOSY
2.5000 mg | PREFILLED_SYRINGE | INTRAVITREAL | Status: AC | PRN
  Administered 2021-02-24: 2.5 mg via INTRAVITREAL

## 2021-02-24 NOTE — Progress Notes (Signed)
02/24/2021     CHIEF COMPLAINT Patient presents for  Chief Complaint  Patient presents with   Retina Follow Up      HISTORY OF PRESENT ILLNESS: Ann Gomez is a 70 y.o. female who presents to the clinic today for:   HPI     Retina Follow Up   Patient presents with  Diabetic Retinopathy.  In left eye.  This started 5 weeks ago.  Duration of 5 weeks.  Since onset it is stable.  I, the attending physician,  performed the HPI with the patient and updated documentation appropriately.        Comments   Visual acuity appears stable or Or improved , Postinjection intravitreal Avastin, 5 weeks postinjection Avastin      Last edited by Edmon Crape, MD on 02/24/2021  9:35 AM.      Referring physician: Sherral Hammers, FNP [phone] [place],  [place] [address]  HISTORICAL INFORMATION:   Selected notes from the MEDICAL RECORD NUMBER    Lab Results  Component Value Date   HGBA1C 9.6 (H) 06/08/2009     CURRENT MEDICATIONS: Current Outpatient Medications (Ophthalmic Drugs)  Medication Sig   dorzolamide-timolol (COSOPT) 22.3-6.8 MG/ML ophthalmic solution 1 drop 2 (two) times daily.   latanoprost (XALATAN) 0.005 % ophthalmic solution 1 drop daily.   No current facility-administered medications for this visit. (Ophthalmic Drugs)   Current Outpatient Medications (Other)  Medication Sig   amLODipine (NORVASC) 10 MG tablet Take 10 mg by mouth daily.   aspirin EC 81 MG tablet Take 1 tablet (81 mg total) by mouth daily.   benazepril (LOTENSIN) 40 MG tablet Take 1 tablet (40 mg total) by mouth daily.   furosemide (LASIX) 40 MG tablet Take 1 tablet (40 mg total) by mouth daily.   insulin glargine (LANTUS) 100 UNIT/ML injection Inject 30 Units into the skin every morning. 25 units in the pm   loperamide (IMODIUM) 2 MG capsule Take 1 capsule (2 mg total) by mouth 2 (two) times daily as needed for diarrhea or loose stools.   losartan (COZAAR) 100 MG tablet Take 100  mg by mouth daily.   metFORMIN (GLUCOPHAGE) 1000 MG tablet Take 1 tablet (1,000 mg total) by mouth 2 (two) times daily with a meal.   metoprolol (LOPRESSOR) 50 MG tablet Take 1 tablet (50 mg total) by mouth 2 (two) times daily.   ondansetron (ZOFRAN) 4 MG tablet Take 1 tablet (4 mg total) by mouth every 8 (eight) hours as needed for nausea or vomiting.   pravastatin (PRAVACHOL) 20 MG tablet Take 20 mg by mouth at bedtime.   terazosin (HYTRIN) 1 MG capsule Take 2 capsules (2 mg total) by mouth at bedtime.   No current facility-administered medications for this visit. (Other)      REVIEW OF SYSTEMS:    ALLERGIES Allergies  Allergen Reactions   Hydrochlorothiazide Other (See Comments)    dizzy    PAST MEDICAL HISTORY Past Medical History:  Diagnosis Date   Diabetes mellitus    Edema    Essential hypertension, benign    Hypertension    Obesity, unspecified    Past Surgical History:  Procedure Laterality Date   CESAREAN SECTION      FAMILY HISTORY Family History  Family history unknown: Yes    SOCIAL HISTORY Social History   Tobacco Use   Smoking status: Never   Smokeless tobacco: Never  Substance Use Topics   Alcohol use: No  Drug use: No         OPHTHALMIC EXAM:  Base Eye Exam     Visual Acuity (ETDRS)       Right Left   Dist [place] 20/60 +1 20/40   Dist ph [place] NI 20/25         Tonometry (Tonopen, 9:00 AM)       Right Left   Pressure 33 23         Pupils       Pupils Dark Light Shape React APD   Right PERRL 3 3 Round Minimal None   Left PERRL 3 3 Round Minimal None         Visual Fields (Counting fingers)       Left Right    Full Full         Extraocular Movement       Right Left    Full, Ortho Full, Ortho         Neuro/Psych     Oriented x3: Yes   Mood/Affect: Normal         Dilation     Left eye: 1.0% Mydriacyl, 2.5% Phenylephrine @ 9:00 AM           Slit Lamp and Fundus Exam     External Exam        Right Left   External Normal Normal         Slit Lamp Exam       Right Left   Lids/Lashes Normal Normal   Conjunctiva/Sclera White and quiet White and quiet   Cornea Clear Clear   Anterior Chamber Deep and quiet Deep and quiet   Iris Round and reactive Round and reactive   Lens Centered posterior chamber intraocular lens Centered posterior chamber intraocular lens   Anterior Vitreous Normal Normal         Fundus Exam       Right Left   Posterior Vitreous Posterior vitreous detachment Normal   Disc Normal No active N/V, Vitt Pap traction of is clinically visible   C/D Ratio 0.35 0.45   Macula no macular thickening, Intraretinal hemorrhage, Severe clinically significant macular edema, Microaneurysms Microaneurysms, Mild clinically significant macular edema   Vessels NPDR-Severe, and findings of intraretinal hemorrhages diffusely like incomplete central retinal vein occlusion with moderate enlargement of vein PDR-active, white lines of retinal nonperfusion nasally and inferiorly on clinical exam   Periphery Diffuse intraretinal hemorrhages peripherally Dot-blot hemorrhage            IMAGING AND PROCEDURES  Imaging and Procedures for 02/24/21  OCT, Retina - OU - Both Eyes       Right Eye Quality was good. Scan locations included subfoveal. Central Foveal Thickness: 199. Progression has been stable. Findings include abnormal foveal contour, cystoid macular edema.   Left Eye Quality was good. Scan locations included subfoveal. Central Foveal Thickness: 326. Progression has been stable. Findings include vitreomacular adhesion , abnormal foveal contour, vitreous traction.   Notes OD vastly improved macular thickening and final resolution of serous fluid attachment beneath the fovea, at 7 weeks postinjection, now with intraocular pressure elevation will not treat today  Much less CSME nasal to FAZ and in the FAZ post injection Avastin OS.  Residual vitreous traction elevates  the fovea however.  Currently at 5 weeks postinjection OS     Intravitreal Injection, Pharmacologic Agent - OS - Left Eye       Time Out 02/24/2021. 9:35 AM. Confirmed correct patient, procedure, site,  and patient consented.   Anesthesia Topical anesthesia was used. Anesthetic medications included Lidocaine 4%.   Procedure Preparation included Tobramycin 0.3%, 10% betadine to eyelids, 5% betadine to ocular surface. A 30 gauge needle was used.   Injection: 2.5 mg bevacizumab 2.5 MG/0.1ML   Route: Intravitreal, Site: Left Eye   NDC: [phone], Lot: [id number]   Post-op Post injection exam found visual acuity of at least counting fingers. The patient tolerated the procedure well. There were no complications. The patient received written and verbal post procedure care education. Post injection medications included ocuflox.              ASSESSMENT/PLAN:  Central retinal vein occlusion with macular edema of right eye Condition OD appears stable  Proliferative diabetic retinopathy of left eye with macular edema associated with type 2 diabetes mellitus (HCC) Much improved though as postinjection Avastin is residual CSME nasal to FAZ has improved nicely.  Residual traction on the fovea elevates the center of the fovea however  Vitreomacular adhesion of left eye Persistent role in ongoing cystoid change in the foveal region due to tractional efforts as CSME has resolved with antivegF     ICD-10-CM   1. 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 Central retinal vein occlusion with macular edema of right eye  H34.8110     3. Vitreomacular adhesion of left eye  H43.822       1.  OS much less severe PDR post PRP over the last few months and much less CSME centrally  post Avastin some 5 weeks previous.  2.  Minor acuity impact currently by vitreomacular traction left eye we will continue to observe and look for spontaneous resolution.  3.  Repeat injection intravitreal Avastin today and extend interval examination examined both eyes in 6 weeks  Ophthalmic Meds Ordered this visit:  Meds ordered this encounter  Medications   bevacizumab (AVASTIN) SOSY 2.5 mg       Return in about 6 weeks (around 04/07/2021) for DILATE OU, OCT.  There are no Patient Instructions on file for this visit.   Explained the diagnoses, plan, and follow up with the patient and they expressed understanding.  Patient expressed understanding of the importance of proper follow up care.   Alford Highland Levern Pitter M.D. Diseases & Surgery of the Retina and Vitreous [facility] 02/24/21     Abbreviations: M myopia (nearsighted); A astigmatism; H hyperopia (farsighted); P presbyopia; Mrx spectacle prescription;  CTL contact lenses; OD right eye; OS left eye; OU both eyes  XT exotropia; ET esotropia; PEK punctate epithelial keratitis; PEE punctate epithelial erosions; DES dry eye syndrome; MGD meibomian gland dysfunction; ATs artificial tears; PFAT's preservative free artificial tears; NSC nuclear sclerotic cataract; PSC posterior subcapsular cataract; ERM epi-retinal membrane; PVD posterior vitreous detachment; RD retinal detachment; DM diabetes mellitus; DR diabetic retinopathy; NPDR non-proliferative diabetic retinopathy; PDR proliferative diabetic retinopathy; CSME clinically significant macular edema; DME diabetic macular edema; dbh dot blot hemorrhages; CWS cotton wool spot; POAG primary open angle glaucoma; C/D cup-to-disc ratio; HVF humphrey visual field; GVF goldmann visual field; OCT optical coherence tomography; IOP intraocular pressure; BRVO Branch retinal vein occlusion; CRVO central retinal vein occlusion; CRAO central retinal artery occlusion; BRAO branch retinal  artery occlusion; RT  retinal tear; SB scleral buckle; PPV pars plana vitrectomy; VH Vitreous hemorrhage; PRP panretinal laser photocoagulation; IVK intravitreal kenalog; VMT vitreomacular traction; MH Macular hole;  NVD neovascularization of the disc; NVE neovascularization elsewhere; AREDS age related eye disease study; ARMD age related macular degeneration; POAG primary open angle glaucoma; EBMD epithelial/anterior basement membrane dystrophy; ACIOL anterior chamber intraocular lens; IOL intraocular lens; PCIOL posterior chamber intraocular lens; Phaco/IOL phacoemulsification with intraocular lens placement; PRK photorefractive keratectomy; LASIK laser assisted in situ keratomileusis; HTN hypertension; DM diabetes mellitus; COPD chronic obstructive pulmonary disease  

## 2021-02-24 NOTE — Assessment & Plan Note (Signed)
Persistent role in ongoing cystoid change in the foveal region due to tractional efforts as CSME has resolved with antivegF

## 2021-02-24 NOTE — Assessment & Plan Note (Signed)
Condition OD appears stable

## 2021-02-24 NOTE — Assessment & Plan Note (Signed)
Much improved though as postinjection Avastin is residual CSME nasal to FAZ has improved nicely.  Residual traction on the fovea elevates the center of the fovea however

## 2021-03-10 ENCOUNTER — Encounter (INDEPENDENT_AMBULATORY_CARE_PROVIDER_SITE_OTHER): Payer: Medicare Other | Admitting: Ophthalmology

## 2021-04-07 ENCOUNTER — Other Ambulatory Visit: Payer: Self-pay

## 2021-04-07 ENCOUNTER — Ambulatory Visit (INDEPENDENT_AMBULATORY_CARE_PROVIDER_SITE_OTHER): Payer: Medicare Other | Admitting: Ophthalmology

## 2021-04-07 ENCOUNTER — Encounter (INDEPENDENT_AMBULATORY_CARE_PROVIDER_SITE_OTHER): Payer: Self-pay | Admitting: Ophthalmology

## 2021-04-07 DIAGNOSIS — H34811 Central retinal vein occlusion, right eye, with macular edema: Secondary | ICD-10-CM

## 2021-04-07 DIAGNOSIS — E113512 Type 2 diabetes mellitus with proliferative diabetic retinopathy with macular edema, left eye: Secondary | ICD-10-CM

## 2021-04-07 DIAGNOSIS — H43822 Vitreomacular adhesion, left eye: Secondary | ICD-10-CM | POA: Diagnosis not present

## 2021-04-07 DIAGNOSIS — E113511 Type 2 diabetes mellitus with proliferative diabetic retinopathy with macular edema, right eye: Secondary | ICD-10-CM

## 2021-04-07 NOTE — Assessment & Plan Note (Signed)
OD stabilized condition, no treatment right eye now for some 3 months.  Continue to observe

## 2021-04-07 NOTE — Assessment & Plan Note (Signed)
Tractional changes on the fovea persist accounting for the CME not responsive to antivegF.  With broad base attachment of the vitreomacular adhesion and traction will likely need vitrectomy.  We will reassess in about approximately 4 to 6 weeks

## 2021-04-07 NOTE — Assessment & Plan Note (Signed)
Minor thickening centrally OS likely due to vitreal macular traction and adhesion and not diabetic CSME.  No change in 6 weeks post most recent injection OS, will observe  May need vitrectomy left eye to release the traction stabilize her best acuity eye

## 2021-04-07 NOTE — Progress Notes (Signed)
04/07/2021     CHIEF COMPLAINT Patient presents for  Chief Complaint  Patient presents with   Retina Follow Up      HISTORY OF PRESENT ILLNESS: Ann Gomez is a 70 y.o. female who presents to the clinic today for:   HPI     Retina Follow Up           Diagnosis: Diabetic Retinopathy   Laterality: both eyes   Onset: 6 weeks ago   Severity: mild   Duration: 6 weeks   Course: stable         Comments   6 week fu ou and oct last intravitreal injections: OS 6 weeks and OD 3 months  Pt states, "My vision seems to be about the same. I cannot tell any real changes I do not think. I just saw Dr. Dione Booze and he said everything looks good." Pt states VA OU stable since last visit. Pt denies FOL, floaters, or ocular pain OU.  Pt reports using Cosopt and Latanoprost OU  OS, now some 6 weeks post most recent injection into vegF  OD some 3 months post most recent injection antivegF         Last edited by Edmon Crape, MD on 04/07/2021 10:20 AM.      Referring physician: Sherral Hammers, FNP [address] [place],  [place] [address]  HISTORICAL INFORMATION:   Selected notes from the MEDICAL RECORD NUMBER    Lab Results  Component Value Date   HGBA1C 9.6 (H) 06/08/2009     CURRENT MEDICATIONS: Current Outpatient Medications (Ophthalmic Drugs)  Medication Sig   dorzolamide-timolol (COSOPT) 22.3-6.8 MG/ML ophthalmic solution 1 drop 2 (two) times daily.   latanoprost (XALATAN) 0.005 % ophthalmic solution 1 drop daily.   No current facility-administered medications for this visit. (Ophthalmic Drugs)   Current Outpatient Medications (Other)  Medication Sig   amLODipine (NORVASC) 10 MG tablet Take 10 mg by mouth daily.   aspirin EC 81 MG tablet Take 1 tablet (81 mg total) by mouth daily.   benazepril (LOTENSIN) 40 MG tablet Take 1 tablet (40 mg total) by mouth daily.   furosemide (LASIX) 40 MG tablet Take 1 tablet (40 mg total) by mouth daily.    insulin glargine (LANTUS) 100 UNIT/ML injection Inject 30 Units into the skin every morning. 25 units in the pm   loperamide (IMODIUM) 2 MG capsule Take 1 capsule (2 mg total) by mouth 2 (two) times daily as needed for diarrhea or loose stools.   losartan (COZAAR) 100 MG tablet Take 100 mg by mouth daily.   metFORMIN (GLUCOPHAGE) 1000 MG tablet Take 1 tablet (1,000 mg total) by mouth 2 (two) times daily with a meal.   metoprolol (LOPRESSOR) 50 MG tablet Take 1 tablet (50 mg total) by mouth 2 (two) times daily.   ondansetron (ZOFRAN) 4 MG tablet Take 1 tablet (4 mg total) by mouth every 8 (eight) hours as needed for nausea or vomiting.   pravastatin (PRAVACHOL) 20 MG tablet Take 20 mg by mouth at bedtime.   terazosin (HYTRIN) 1 MG capsule Take 2 capsules (2 mg total) by mouth at bedtime.   No current facility-administered medications for this visit. (Other)      REVIEW OF SYSTEMS:    ALLERGIES Allergies  Allergen Reactions   Hydrochlorothiazide Other (See Comments)    dizzy    PAST MEDICAL HISTORY Past Medical History:  Diagnosis Date   Diabetes mellitus    Edema  Essential hypertension, benign    Hypertension    Obesity, unspecified    Past Surgical History:  Procedure Laterality Date   CESAREAN SECTION      FAMILY HISTORY Family History  Family history unknown: Yes    SOCIAL HISTORY Social History   Tobacco Use   Smoking status: Never   Smokeless tobacco: Never  Substance Use Topics   Alcohol use: No   Drug use: No         OPHTHALMIC EXAM:  Base Eye Exam     Visual Acuity (ETDRS)       Right Left   Dist [place] 20/200 20/40 -1   Dist ph [place] 20/100 -1 NI         Tonometry (Tonopen, 9:43 AM)       Right Left   Pressure 21 19         Pupils       Pupils Shape React APD   Right PERRL Round Minimal None   Left PERRL Round Minimal None         Visual Fields (Counting fingers)       Left Right    Full Full         Extraocular  Movement       Right Left    Full Full         Neuro/Psych     Oriented x3: Yes   Mood/Affect: Normal         Dilation     Both eyes: 1.0% Mydriacyl, 2.5% Phenylephrine @ 9:43 AM           Slit Lamp and Fundus Exam     External Exam       Right Left   External Normal Normal         Slit Lamp Exam       Right Left   Lids/Lashes Normal Normal   Conjunctiva/Sclera White and quiet White and quiet   Cornea Clear Clear   Anterior Chamber Deep and quiet Deep and quiet   Iris Round and reactive Round and reactive   Lens Centered posterior chamber intraocular lens Centered posterior chamber intraocular lens   Anterior Vitreous Normal Normal         Fundus Exam       Right Left   Posterior Vitreous Posterior vitreous detachment Normal   Disc Normal No active N/V, Vitt Pap traction of is clinically visible   C/D Ratio 0.35 0.45   Macula no macular thickening, Intraretinal hemorrhage, Severe clinically significant macular edema, Microaneurysms Microaneurysms, Mild clinically significant macular edema   Vessels NPDR-Severe, and findings of intraretinal hemorrhages diffusely like incomplete central retinal vein occlusion with moderate enlargement of vein PDR-active, white lines of retinal nonperfusion nasally and inferiorly on clinical exam   Periphery Diffuse intraretinal hemorrhages peripherally Dot-blot hemorrhage            IMAGING AND PROCEDURES  Imaging and Procedures for 04/07/21  OCT, Retina - OU - Both Eyes       Right Eye Quality was good. Scan locations included subfoveal. Central Foveal Thickness: 228. Progression has been stable. Findings include abnormal foveal contour, cystoid macular edema.   Left Eye Quality was good. Scan locations included subfoveal. Central Foveal Thickness: 332. Progression has been stable. Findings include vitreomacular adhesion , abnormal foveal contour, vitreous traction.   Notes OD vastly improved macular  thickening and final resolution of serous fluid attachment beneath the fovea,  Much less CSME nasal to FAZ  and in the FAZ post injection Avastin OS.  Residual vitreous traction elevates the fovea however.  Currently at 6 weeks postinjection OS             ASSESSMENT/PLAN:  Central retinal vein occlusion with macular edema of right eye OD stabilized condition, no treatment right eye now for some 3 months.  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  No change in 6 weeks post most recent injection OS, will observe  May need vitrectomy left eye to release the traction stabilize her best acuity eye     ICD-10-CM   1. 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 Central retinal vein occlusion with macular edema of right eye  H34.8110     3. Proliferative diabetic retinopathy of right eye with macular edema associated with type 2 diabetes mellitus (HCC)  E11.3511 OCT, Retina - OU - Both Eyes      1.  Persistent cystoid change fovea left eye likely related to VMT.  We will likely need to offer vitrectomy will follow-up in 6 weeks or so to look for worsening of CSME component of disease.  If no worsening and persistent VMT, will offer vitrectomy thereafter  2.  OD center involved CME from [facility] O with underlying diabetic retinopathy, stable no treatment now for some 6 months will observe  3.  Remain follow-up with Dr. Dione Booze as scheduled  Ophthalmic Meds Ordered this visit:  No orders of the defined types were placed in this encounter.      Return in about 7 weeks (around 05/26/2021) for DILATE OU, OCT.  There are no Patient Instructions on file for this visit.   Explained the  diagnoses, plan, and follow up with the patient and they expressed understanding.  Patient expressed understanding of the importance of proper follow up care.   Alford Highland Mayola Mcbain M.D. Diseases & Surgery of the Retina and Vitreous [facility] 04/07/21     Abbreviations: M myopia (nearsighted); A astigmatism; H hyperopia (farsighted); P presbyopia; Mrx spectacle prescription;  CTL contact lenses; OD right eye; OS left eye; OU both eyes  XT exotropia; ET esotropia; PEK punctate epithelial keratitis; PEE punctate epithelial erosions; DES dry eye syndrome; MGD meibomian gland dysfunction; ATs artificial tears; PFAT's preservative free artificial tears; NSC nuclear sclerotic cataract; PSC posterior subcapsular cataract; ERM epi-retinal membrane; PVD posterior vitreous detachment; RD retinal detachment; DM diabetes mellitus; DR diabetic retinopathy; NPDR non-proliferative diabetic retinopathy; PDR proliferative diabetic retinopathy; CSME clinically significant macular edema; DME diabetic macular edema; dbh dot blot hemorrhages; CWS cotton wool spot; POAG primary open angle glaucoma; C/D cup-to-disc ratio; HVF humphrey visual field; GVF goldmann visual field; OCT optical coherence tomography; IOP intraocular pressure; BRVO Branch retinal vein occlusion; CRVO central retinal vein occlusion; CRAO central retinal artery occlusion; BRAO branch retinal artery occlusion; RT retinal tear; SB scleral buckle; PPV pars plana vitrectomy; VH Vitreous hemorrhage; PRP panretinal laser photocoagulation; IVK intravitreal kenalog; VMT vitreomacular traction; MH Macular hole;  NVD neovascularization of the disc; NVE neovascularization elsewhere; AREDS age related eye disease study; ARMD age related macular degeneration; POAG primary open angle glaucoma; EBMD epithelial/anterior basement membrane dystrophy; ACIOL anterior chamber intraocular lens; IOL intraocular lens; PCIOL  posterior chamber intraocular lens;  Phaco/IOL phacoemulsification with intraocular lens placement; PRK photorefractive keratectomy; LASIK laser assisted in situ keratomileusis; HTN hypertension; DM diabetes mellitus; COPD chronic obstructive pulmonary disease

## 2021-05-26 ENCOUNTER — Ambulatory Visit (INDEPENDENT_AMBULATORY_CARE_PROVIDER_SITE_OTHER): Payer: Medicare Other | Admitting: Ophthalmology

## 2021-05-26 ENCOUNTER — Other Ambulatory Visit: Payer: Self-pay

## 2021-05-26 ENCOUNTER — Encounter (INDEPENDENT_AMBULATORY_CARE_PROVIDER_SITE_OTHER): Payer: Self-pay | Admitting: Ophthalmology

## 2021-05-26 DIAGNOSIS — E113512 Type 2 diabetes mellitus with proliferative diabetic retinopathy with macular edema, left eye: Secondary | ICD-10-CM

## 2021-05-26 DIAGNOSIS — E113511 Type 2 diabetes mellitus with proliferative diabetic retinopathy with macular edema, right eye: Secondary | ICD-10-CM

## 2021-05-26 DIAGNOSIS — H43822 Vitreomacular adhesion, left eye: Secondary | ICD-10-CM | POA: Diagnosis not present

## 2021-05-26 MED ORDER — BEVACIZUMAB 2.5 MG/0.1ML IZ SOSY
2.5000 mg | PREFILLED_SYRINGE | INTRAVITREAL | Status: AC | PRN
  Administered 2021-05-26: 2.5 mg via INTRAVITREAL

## 2021-05-26 NOTE — Progress Notes (Signed)
05/26/2021     CHIEF COMPLAINT Patient presents for  Chief Complaint  Patient presents with   Retina Follow Up      HISTORY OF PRESENT ILLNESS: Ann Gomez is a 71 y.o. female who presents to the clinic today for:   HPI     Retina Follow Up           Diagnosis: Diabetic Retinopathy   Laterality: both eyes   Onset: 7 weeks ago   Severity: mild   Duration: 7 weeks   Course: stable         Comments   7 weeks fu Dilate OU, OCT. Patient states vision is stable and unchanged since last visit. Denies any new floaters or FOL.        Last edited by Edmon Crapeankin, Azula Zappia A, MD on 05/26/2021  8:39 AM.      Referring physician: Sherral HammersWilliams, Alvesha A, FNP [phone] [place] [id number]  HISTORICAL INFORMATION:   Selected notes from the MEDICAL RECORD NUMBER    Lab Results  Component Value Date   HGBA1C 9.6 (H) 06/08/2009     CURRENT MEDICATIONS: Current Outpatient Medications (Ophthalmic Drugs)  Medication Sig   dorzolamide-timolol (COSOPT) 22.3-6.8 MG/ML ophthalmic solution 1 drop 2 (two) times daily.   latanoprost (XALATAN) 0.005 % ophthalmic solution 1 drop daily.   No current facility-administered medications for this visit. (Ophthalmic Drugs)   Current Outpatient Medications (Other)  Medication Sig   amLODipine (NORVASC) 10 MG tablet Take 10 mg by mouth daily.   aspirin EC 81 MG tablet Take 1 tablet (81 mg total) by mouth daily.   benazepril (LOTENSIN) 40 MG tablet Take 1 tablet (40 mg total) by mouth daily.   furosemide (LASIX) 40 MG tablet Take 1 tablet (40 mg total) by mouth daily.   insulin glargine (LANTUS) 100 UNIT/ML injection Inject 30 Units into the skin every morning. 25 units in the pm   loperamide (IMODIUM) 2 MG capsule Take 1 capsule (2 mg total) by mouth 2 (two) times daily as needed for diarrhea or loose stools.   losartan (COZAAR) 100 MG tablet Take 100 mg by mouth daily.   metFORMIN (GLUCOPHAGE) 1000 MG tablet Take 1 tablet (1,000  mg total) by mouth 2 (two) times daily with a meal.   metoprolol (LOPRESSOR) 50 MG tablet Take 1 tablet (50 mg total) by mouth 2 (two) times daily.   ondansetron (ZOFRAN) 4 MG tablet Take 1 tablet (4 mg total) by mouth every 8 (eight) hours as needed for nausea or vomiting.   pravastatin (PRAVACHOL) 20 MG tablet Take 20 mg by mouth at bedtime.   terazosin (HYTRIN) 1 MG capsule Take 2 capsules (2 mg total) by mouth at bedtime.   No current facility-administered medications for this visit. (Other)      REVIEW OF SYSTEMS: ROS   Negative for: Constitutional, Gastrointestinal, Neurological, Skin, Genitourinary, Musculoskeletal, HENT, Endocrine, Cardiovascular, Eyes, Respiratory, Psychiatric, Allergic/Imm, Heme/Lymph Last edited by Edmon Crapeankin, Pio Eatherly A, MD on 05/26/2021  8:39 AM.       ALLERGIES Allergies  Allergen Reactions   Hydrochlorothiazide Other (See Comments)    dizzy    PAST MEDICAL HISTORY Past Medical History:  Diagnosis Date   Diabetes mellitus    Edema    Essential hypertension, benign    Hypertension    Obesity, unspecified    Past Surgical History:  Procedure Laterality Date   CESAREAN SECTION      FAMILY HISTORY Family History  Family history unknown: Yes    SOCIAL HISTORY Social History   Tobacco Use   Smoking status: Never   Smokeless tobacco: Never  Substance Use Topics   Alcohol use: No   Drug use: No         OPHTHALMIC EXAM:  Base Eye Exam     Visual Acuity (ETDRS)       Right Left   Dist [place] CF at 4' 20/30 -1   Dist ph [place] NI          Tonometry (Tonopen, 8:24 AM)       Right Left   Pressure 33 15         Tonometry #2 (Tonopen, 8:27 AM)       Right Left   Pressure 30          Pupils       Pupils Dark Light APD   Right PERRL 4 3 None   Left PERRL 4 3 None         Extraocular Movement       Right Left    Full Full         Neuro/Psych     Oriented x3: Yes   Mood/Affect: Normal         Dilation      Both eyes: 1.0% Mydriacyl, 2.5% Phenylephrine @ 8:24 AM           Slit Lamp and Fundus Exam     External Exam       Right Left   External Normal Normal         Slit Lamp Exam       Right Left   Lids/Lashes Normal Normal   Conjunctiva/Sclera White and quiet White and quiet   Cornea Clear Clear   Anterior Chamber Deep and quiet Deep and quiet   Iris Round and reactive Round and reactive   Lens Centered posterior chamber intraocular lens Centered posterior chamber intraocular lens   Anterior Vitreous Normal Normal         Fundus Exam       Right Left   Posterior Vitreous Posterior vitreous detachment Normal   Disc Normal No active N/V, Vitt Pap traction of is clinically visible   C/D Ratio 0.35 0.45   Macula Intraretinal hemorrhage, Severe clinically significant macular edema, Microaneurysms, Macular thickening Microaneurysms, Mild clinically significant macular edema   Vessels NPDR-Severe, and findings of intraretinal hemorrhages diffusely like incomplete central retinal vein occlusion with moderate enlargement of vein PDR-active, white lines of retinal nonperfusion nasally and inferiorly on clinical exam   Periphery Diffuse intraretinal hemorrhages peripherally, moderate scatter laser Dot-blot hemorrhage, moderate scatter laser            IMAGING AND PROCEDURES  Imaging and Procedures for 05/26/21  OCT, Retina - OU - Both Eyes       Right Eye Quality was good. Scan locations included subfoveal. Central Foveal Thickness: 670. Progression has worsened. Findings include abnormal foveal contour, cystoid macular edema.   Left Eye Quality was good. Scan locations included subfoveal. Central Foveal Thickness: 345. Progression has been stable. Findings include vitreomacular adhesion , abnormal foveal contour, vitreous traction.   Notes OD significant recurrence of CSME macular thickening in the right eye.  Will need to commence and restart therapy with  antivegF  Much less CSME nasal to FAZ and in the FAZ post injection Avastin OS.  Residual vitreous traction elevates the fovea however.  Currently at  13 weeks postinjection OS  Intravitreal Injection, Pharmacologic Agent - OD - Right Eye       Time Out 05/26/2021. 8:44 AM. Confirmed correct patient, procedure, site, and patient consented.   Anesthesia Topical anesthesia was used. Anesthetic medications included Lidocaine 4%.   Procedure Preparation included Tobramycin 0.3%, 10% betadine to eyelids, 5% betadine to ocular surface. A 30 gauge needle was used.   Injection: 2.5 mg bevacizumab 2.5 MG/0.1ML   Route: Intravitreal, Site: Right Eye   NDC: [phone], Lot: [id number]   Post-op Post injection exam found visual acuity of at least counting fingers. The patient tolerated the procedure well. There were no complications. The patient received written and verbal post procedure care education. Post injection medications were not given.              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 type 2 diabetes mellitus (HCC) Stable overall with center involved CME likely from vitreal macular traction as well and epiretinal membrane may need surgical attention to this condition  Vitreomacular adhesion of left eye Stable overall with center involved CME likely from vitreal macular traction as well and epiretinal membrane may need surgical attention to this condition  Diabetic macular edema of right eye with proliferative retinopathy associated with type 2 diabetes mellitus (HCC) Recurrence or sooner involved CSME at 34-month interval OD, will need to restart antivegF, Avastin right eye today     ICD-10-CM   1. Proliferative diabetic retinopathy of right eye with macular edema associated with type 2 diabetes mellitus (HCC)  E11.3511 OCT, Retina - OU - Both Eyes    Intravitreal Injection, Pharmacologic Agent - OD - Right Eye     bevacizumab (AVASTIN) SOSY 2.5 mg    2. 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 Vitreomacular adhesion of left eye  H43.822     4. Diabetic macular edema of right eye with proliferative retinopathy associated with type 2 diabetes mellitus (HCC)  E11.3511       1.  OS center involved CME from likely VMT persistent, no residual perifoveal CSME thus may need vitrectomy in the left eye in the future to resolve this issue  2.  OD with massive recurrence of CSME center involved, at 4 months post most recent injection, repeat injection today and follow-up again in 5 to 6 weeks for evaluation right eye  3.  Dilate OU next, consider vitrectomy left eye if VMT persists  Ophthalmic Meds Ordered this visit:  Meds ordered this encounter  Medications   bevacizumab (AVASTIN) SOSY 2.5 mg       Return in about 5 weeks (around 06/30/2021) for DILATE OU, AVASTIN OCT,,, and consider preop OS for vitrectomy release VMT.  There are no Patient Instructions on file for this visit.   Explained the diagnoses, plan, and follow up with the patient and they expressed understanding.  Patient expressed understanding of the importance of proper follow up care.   Alford Highland Gurley Climer M.D. Diseases & Surgery of the Retina and Vitreous [facility] 05/26/21     Abbreviations: M myopia (nearsighted); A astigmatism; H hyperopia (farsighted); P presbyopia; Mrx spectacle prescription;  CTL contact lenses; OD right eye; OS left eye; OU both eyes  XT exotropia; ET esotropia; PEK punctate epithelial keratitis; PEE punctate epithelial erosions; DES dry eye syndrome; MGD meibomian gland dysfunction; ATs artificial tears; PFAT's  preservative free artificial tears; NSC nuclear sclerotic cataract; PSC posterior subcapsular cataract; ERM epi-retinal membrane; PVD posterior vitreous detachment; RD  retinal detachment; DM diabetes mellitus; DR diabetic retinopathy; NPDR non-proliferative diabetic retinopathy; PDR proliferative diabetic retinopathy; CSME clinically significant macular edema; DME diabetic macular edema; dbh dot blot hemorrhages; CWS cotton wool spot; POAG primary open angle glaucoma; C/D cup-to-disc ratio; HVF humphrey visual field; GVF goldmann visual field; OCT optical coherence tomography; IOP intraocular pressure; BRVO Branch retinal vein occlusion; CRVO central retinal vein occlusion; CRAO central retinal artery occlusion; BRAO branch retinal artery occlusion; RT retinal tear; SB scleral buckle; PPV pars plana vitrectomy; VH Vitreous hemorrhage; PRP panretinal laser photocoagulation; IVK intravitreal kenalog; VMT vitreomacular traction; MH Macular hole;  NVD neovascularization of the disc; NVE neovascularization elsewhere; AREDS age related eye disease study; ARMD age related macular degeneration; POAG primary open angle glaucoma; EBMD epithelial/anterior basement membrane dystrophy; ACIOL anterior chamber intraocular lens; IOL intraocular lens; PCIOL posterior chamber intraocular lens; Phaco/IOL phacoemulsification with intraocular lens placement; PRK photorefractive keratectomy; LASIK laser assisted in situ keratomileusis; HTN hypertension; DM diabetes mellitus; COPD chronic obstructive pulmonary disease

## 2021-05-26 NOTE — Assessment & Plan Note (Signed)
Stable overall with center involved CME likely from vitreal macular traction as well and epiretinal membrane may need surgical attention to this condition °

## 2021-05-26 NOTE — Assessment & Plan Note (Signed)
Recurrence or sooner involved CSME at 68-month interval OD, will need to restart antivegF, Avastin right eye today

## 2021-05-26 NOTE — Assessment & Plan Note (Signed)
Stable overall with center involved CME likely from vitreal macular traction as well and epiretinal membrane may need surgical attention to this condition

## 2021-06-30 ENCOUNTER — Other Ambulatory Visit: Payer: Self-pay

## 2021-06-30 ENCOUNTER — Encounter (INDEPENDENT_AMBULATORY_CARE_PROVIDER_SITE_OTHER): Payer: Self-pay | Admitting: Ophthalmology

## 2021-06-30 ENCOUNTER — Ambulatory Visit (INDEPENDENT_AMBULATORY_CARE_PROVIDER_SITE_OTHER): Payer: Medicare Other | Admitting: Ophthalmology

## 2021-06-30 DIAGNOSIS — H43822 Vitreomacular adhesion, left eye: Secondary | ICD-10-CM

## 2021-06-30 DIAGNOSIS — E113511 Type 2 diabetes mellitus with proliferative diabetic retinopathy with macular edema, right eye: Secondary | ICD-10-CM

## 2021-06-30 DIAGNOSIS — E113512 Type 2 diabetes mellitus with proliferative diabetic retinopathy with macular edema, left eye: Secondary | ICD-10-CM

## 2021-06-30 MED ORDER — BEVACIZUMAB 2.5 MG/0.1ML IZ SOSY
2.5000 mg | PREFILLED_SYRINGE | INTRAVITREAL | Status: AC | PRN
  Administered 2021-06-30: 2.5 mg via INTRAVITREAL

## 2021-06-30 NOTE — Assessment & Plan Note (Signed)
Ongoing role of center involved CME OS secondary to vitreomacular traction.  Not amenable to therapy with antivegF alone, with good acuity and no impact on visual functioning we will continue to monitor and observe ?

## 2021-06-30 NOTE — Progress Notes (Signed)
06/30/2021     CHIEF COMPLAINT Patient presents for  Chief Complaint  Patient presents with   Diabetic Retinopathy with Macular Edema      HISTORY OF PRESENT ILLNESS: Ann Gomez is a 71 y.o. female who presents to the clinic today for:   HPI   5 weeks dilate OU, Avastin OCT, consider preop OS for vitrectomy release VMT. Patient states vision is stable and unchanged since last visit. Denies any new floaters or FOL. Dorz-tim BID OU, Latanoprost QHS OU.  Last edited by Nelva Nay on 06/30/2021  8:22 AM.      Referring physician: Sherral Hammers, FNP [phone] [address] [place],  [place] [address]  HISTORICAL INFORMATION:   Selected notes from the MEDICAL RECORD NUMBER    Lab Results  Component Value Date   HGBA1C 9.6 (H) 06/08/2009     CURRENT MEDICATIONS: Current Outpatient Medications (Ophthalmic Drugs)  Medication Sig   dorzolamide-timolol (COSOPT) 22.3-6.8 MG/ML ophthalmic solution 1 drop 2 (two) times daily.   latanoprost (XALATAN) 0.005 % ophthalmic solution 1 drop daily.   No current facility-administered medications for this visit. (Ophthalmic Drugs)   Current Outpatient Medications (Other)  Medication Sig   amLODipine (NORVASC) 10 MG tablet Take 10 mg by mouth daily.   aspirin EC 81 MG tablet Take 1 tablet (81 mg total) by mouth daily.   benazepril (LOTENSIN) 40 MG tablet Take 1 tablet (40 mg total) by mouth daily.   furosemide (LASIX) 40 MG tablet Take 1 tablet (40 mg total) by mouth daily.   insulin glargine (LANTUS) 100 UNIT/ML injection Inject 30 Units into the skin every morning. 25 units in the pm   loperamide (IMODIUM) 2 MG capsule Take 1 capsule (2 mg total) by mouth 2 (two) times daily as needed for diarrhea or loose stools.   losartan (COZAAR) 100 MG tablet Take 100 mg by mouth daily.   metFORMIN (GLUCOPHAGE) 1000 MG tablet Take 1 tablet (1,000 mg total) by mouth 2 (two) times daily with a meal.   metoprolol (LOPRESSOR) 50 MG tablet  Take 1 tablet (50 mg total) by mouth 2 (two) times daily.   ondansetron (ZOFRAN) 4 MG tablet Take 1 tablet (4 mg total) by mouth every 8 (eight) hours as needed for nausea or vomiting.   pravastatin (PRAVACHOL) 20 MG tablet Take 20 mg by mouth at bedtime.   terazosin (HYTRIN) 1 MG capsule Take 2 capsules (2 mg total) by mouth at bedtime.   No current facility-administered medications for this visit. (Other)      REVIEW OF SYSTEMS: ROS   Negative for: Constitutional, Gastrointestinal, Neurological, Skin, Genitourinary, Musculoskeletal, HENT, Endocrine, Cardiovascular, Eyes, Respiratory, Psychiatric, Allergic/Imm, Heme/Lymph Last edited by Edmon Crape, MD on 06/30/2021  9:14 AM.       ALLERGIES Allergies  Allergen Reactions   Hydrochlorothiazide Other (See Comments)    dizzy    PAST MEDICAL HISTORY Past Medical History:  Diagnosis Date   Diabetes mellitus    Edema    Essential hypertension, benign    Hypertension    Obesity, unspecified    Past Surgical History:  Procedure Laterality Date   CESAREAN SECTION      FAMILY HISTORY Family History  Family history unknown: Yes    SOCIAL HISTORY Social History   Tobacco Use   Smoking status: Never   Smokeless tobacco: Never  Substance Use Topics   Alcohol use: No   Drug use: No  OPHTHALMIC EXAM:  Base Eye Exam     Visual Acuity (ETDRS)       Right Left   Dist [place] 20/125 -1+1 20/40 -1   Dist ph [place] 20/100 -2 20/40 +2         Tonometry (Tonopen, 8:26 AM)       Right Left   Pressure 30 18         Pupils       Pupils APD   Right PERRL None   Left PERRL None         Visual Fields       Left Right    Full Full         Extraocular Movement       Right Left    Full, Ortho Full, Ortho         Neuro/Psych     Oriented x3: Yes   Mood/Affect: Normal         Dilation     Both eyes: 1.0% Mydriacyl, 2.5% Phenylephrine @ 8:26 AM           Slit Lamp and Fundus Exam      External Exam       Right Left   External Normal Normal         Slit Lamp Exam       Right Left   Lids/Lashes Normal Normal   Conjunctiva/Sclera White and quiet White and quiet   Cornea Clear Clear   Anterior Chamber Deep and quiet Deep and quiet   Iris Round and reactive Round and reactive   Lens Centered posterior chamber intraocular lens Centered posterior chamber intraocular lens   Anterior Vitreous Normal Normal         Fundus Exam       Right Left   Posterior Vitreous Posterior vitreous detachment Normal   Disc Normal No active N/V, Vitt Pap traction of is clinically visible   C/D Ratio 0.35 0.45   Macula Intraretinal hemorrhage, Microaneurysms, no clinically significant macular edema, no macular thickening Microaneurysms, Mild clinically significant macular edema   Vessels NPDR-Severe, and findings of intraretinal hemorrhages diffusely like incomplete central retinal vein occlusion with moderate enlargement of vein PDR-active, white lines of retinal nonperfusion nasally and inferiorly on clinical exam   Periphery Diffuse intraretinal hemorrhages peripherally, moderate scatter laser Dot-blot hemorrhage, moderate scatter laser            IMAGING AND PROCEDURES  Imaging and Procedures for 06/30/21  OCT, Retina - OU - Both Eyes       Right Eye Quality was good. Scan locations included subfoveal. Central Foveal Thickness: 202. Progression has worsened. Findings include abnormal foveal contour, cystoid macular edema.   Left Eye Quality was good. Scan locations included subfoveal. Central Foveal Thickness: 345. Progression has been stable. Findings include vitreomacular adhesion , abnormal foveal contour, vitreous traction.   Notes OD significant recurrence of CSME macular thickening in the right eye.  Will need to commence and restart therapy with antivegF  Much less CSME nasal to FAZ and in the FAZ post injection Avastin OS.  Residual vitreous traction  elevates the fovea however.  Currently at  13 weeks postinjection OS     Intravitreal Injection, Pharmacologic Agent - OD - Right Eye       Time Out 06/30/2021. 9:18 AM. Confirmed correct patient, procedure, site, and patient consented.   Anesthesia Topical anesthesia was used. Anesthetic medications included Lidocaine 4%.   Procedure Preparation included Tobramycin 0.3%, 10%  betadine to eyelids, 5% betadine to ocular surface. A 30 gauge needle was used.   Injection: 2.5 mg bevacizumab 2.5 MG/0.1ML   Route: Intravitreal, Site: Right Eye   NDC: [phone], Lot: [id number]   Post-op Post injection exam found visual acuity of at least counting fingers. The patient tolerated the procedure well. There were no complications. The patient received written and verbal post procedure care education. Post injection medications were not given.              ASSESSMENT/PLAN:  Diabetic macular edema of right eye with proliferative retinopathy associated with type 2 diabetes mellitus (HCC) 1 month post recent injection Avastin OD, vastly improved center CSME, will repeat injection today and maintain 5 to 6-week interval right eye.  Vitreomacular adhesion of left eye Ongoing role of center involved CME OS secondary to vitreomacular traction.  Not amenable to therapy with antivegF alone, with good acuity and no impact on visual functioning we will continue to monitor and observe     ICD-10-CM   1. 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 Diabetic macular edema of right eye with proliferative retinopathy associated with  type 2 diabetes mellitus (HCC)  E11.3511     4. Vitreomacular adhesion of left eye  H43.822       1.  OD improved acuity and improved macular clinical findings and OCT findings some 1 month post most recent injection Avastin for recurrence of CSME centrally, will repeat injection today to maintain follow-up in 5-6 weeks  2.  OS with center involved CME secondary to VMT and not amenable therapy with antivegF, will continue to monitor and as long as acuity is satisfies patient's needs we will continue to monitor and no need for vitrectomy at this time  3.  Ophthalmic Meds Ordered this visit:  Meds ordered this encounter  Medications   bevacizumab (AVASTIN) SOSY 2.5 mg       Return in about 6 weeks (around 08/11/2021) for dilate, OD, AVASTIN OCT.  There are no Patient Instructions on file for this visit.   Explained the diagnoses, plan, and follow up with the patient and they expressed understanding.  Patient expressed understanding of the importance of proper follow up care.   Alford Highland Marnette Perkins M.D. Diseases & Surgery of the Retina and Vitreous [facility] 06/30/21     Abbreviations: M myopia (nearsighted); A astigmatism; H hyperopia (farsighted); P presbyopia; Mrx spectacle prescription;  CTL contact lenses; OD right eye; OS left eye; OU both eyes  XT exotropia; ET esotropia; PEK punctate epithelial keratitis; PEE punctate epithelial erosions; DES dry eye syndrome; MGD meibomian gland dysfunction; ATs artificial tears; PFAT's preservative free artificial tears; NSC nuclear sclerotic cataract; PSC posterior subcapsular cataract; ERM epi-retinal membrane; PVD posterior vitreous detachment; RD retinal detachment; DM diabetes mellitus; DR diabetic retinopathy; NPDR non-proliferative diabetic retinopathy; PDR proliferative diabetic retinopathy; CSME clinically significant macular edema; DME diabetic macular edema; dbh dot blot hemorrhages; CWS cotton wool spot; POAG primary  open angle glaucoma; C/D cup-to-disc ratio; HVF humphrey visual field; GVF goldmann visual field; OCT optical coherence  tomography; IOP intraocular pressure; BRVO Branch retinal vein occlusion; CRVO central retinal vein occlusion; CRAO central retinal artery occlusion; BRAO branch retinal artery occlusion; RT retinal tear; SB scleral buckle; PPV pars plana vitrectomy; VH Vitreous hemorrhage; PRP panretinal laser photocoagulation; IVK intravitreal kenalog; VMT vitreomacular traction; MH Macular hole;  NVD neovascularization of the disc; NVE neovascularization elsewhere; AREDS age related eye disease study; ARMD age related macular degeneration; POAG primary open angle glaucoma; EBMD epithelial/anterior basement membrane dystrophy; ACIOL anterior chamber intraocular lens; IOL intraocular lens; PCIOL posterior chamber intraocular lens; Phaco/IOL phacoemulsification with intraocular lens placement; Silo photorefractive keratectomy; LASIK laser assisted in situ keratomileusis; HTN hypertension; DM diabetes mellitus; COPD chronic obstructive pulmonary disease

## 2021-06-30 NOTE — Assessment & Plan Note (Signed)
1 month post recent injection Avastin OD, vastly improved center CSME, will repeat injection today and maintain 5 to 6-week interval right eye. ?

## 2021-08-11 ENCOUNTER — Encounter (INDEPENDENT_AMBULATORY_CARE_PROVIDER_SITE_OTHER): Payer: Medicare Other | Admitting: Ophthalmology

## 2021-08-12 ENCOUNTER — Encounter (INDEPENDENT_AMBULATORY_CARE_PROVIDER_SITE_OTHER): Payer: Medicare Other | Admitting: Ophthalmology

## 2021-08-12 ENCOUNTER — Encounter (INDEPENDENT_AMBULATORY_CARE_PROVIDER_SITE_OTHER): Payer: Self-pay | Admitting: Ophthalmology

## 2021-08-12 ENCOUNTER — Ambulatory Visit (INDEPENDENT_AMBULATORY_CARE_PROVIDER_SITE_OTHER): Payer: Medicare Other | Admitting: Ophthalmology

## 2021-08-12 DIAGNOSIS — H43822 Vitreomacular adhesion, left eye: Secondary | ICD-10-CM

## 2021-08-12 DIAGNOSIS — E113511 Type 2 diabetes mellitus with proliferative diabetic retinopathy with macular edema, right eye: Secondary | ICD-10-CM

## 2021-08-12 MED ORDER — BEVACIZUMAB 2.5 MG/0.1ML IZ SOSY
2.5000 mg | PREFILLED_SYRINGE | INTRAVITREAL | Status: AC | PRN
  Administered 2021-08-12: 2.5 mg via INTRAVITREAL

## 2021-08-12 NOTE — Assessment & Plan Note (Signed)
Improved with restart of antivegF medication February 2023, will repeat injection Avastin OD today ?

## 2021-08-12 NOTE — Assessment & Plan Note (Signed)
Tractional changes left eye maintaining cystoid impact on the fovea may very well need vitrectomy as his broad-based attachment is not releasing triggering CSME in the fovea responsive to therapy intravitreal  with antivegF ?

## 2021-08-12 NOTE — Progress Notes (Signed)
? ? ?08/12/2021 ? ?  ? ?CHIEF COMPLAINT ?Patient presents for  ?Chief Complaint  ?Patient presents with  ? Diabetic Retinopathy with Macular Edema  ? ? ? ? ?HISTORY OF PRESENT ILLNESS: ?Ann Gomez is a 71 y.o. female who presents to the clinic today for:  ? ?HPI   ?6 weeks for DILATE, OD AVASTIN OCT. ?Pt stated that vision has improved. ?Pt denies new onset floaters and FOL. ?Pt is still taking TIMOLOL- 2x daily ?Pt is still taking Latanoprost- 1x daily ? ? ?Last edited by Angeline SlimMa, San on 08/12/2021  8:36 AM.  ?  ? ? ?Referring physician: ?Sherral HammersWilliams, Alvesha A, FNP ?[id number] ?[place] [id number] ? ?HISTORICAL INFORMATION:  ? ?Selected notes from the MEDICAL RECORD NUMBER ?  ? ?Lab Results  ?Component Value Date  ? HGBA1C 9.6 (H) 06/08/2009  ?  ? ?CURRENT MEDICATIONS: ?Current Outpatient Medications (Ophthalmic Drugs)  ?Medication Sig  ? dorzolamide-timolol (COSOPT) 22.3-6.8 MG/ML ophthalmic solution 1 drop 2 (two) times daily.  ? latanoprost (XALATAN) 0.005 % ophthalmic solution 1 drop daily.  ? ?No current facility-administered medications for this visit. (Ophthalmic Drugs)  ? ?Current Outpatient Medications (Other)  ?Medication Sig  ? amLODipine (NORVASC) 10 MG tablet Take 10 mg by mouth daily.  ? aspirin EC 81 MG tablet Take 1 tablet (81 mg total) by mouth daily.  ? benazepril (LOTENSIN) 40 MG tablet Take 1 tablet (40 mg total) by mouth daily.  ? furosemide (LASIX) 40 MG tablet Take 1 tablet (40 mg total) by mouth daily.  ? insulin glargine (LANTUS) 100 UNIT/ML injection Inject 30 Units into the skin every morning. 25 units in the pm  ? loperamide (IMODIUM) 2 MG capsule Take 1 capsule (2 mg total) by mouth 2 (two) times daily as needed for diarrhea or loose stools.  ? losartan (COZAAR) 100 MG tablet Take 100 mg by mouth daily.  ? metFORMIN (GLUCOPHAGE) 1000 MG tablet Take 1 tablet (1,000 mg total) by mouth 2 (two) times daily with a meal.  ? metoprolol (LOPRESSOR) 50 MG tablet Take 1 tablet (50 mg total)  by mouth 2 (two) times daily.  ? ondansetron (ZOFRAN) 4 MG tablet Take 1 tablet (4 mg total) by mouth every 8 (eight) hours as needed for nausea or vomiting.  ? pravastatin (PRAVACHOL) 20 MG tablet Take 20 mg by mouth at bedtime.  ? terazosin (HYTRIN) 1 MG capsule Take 2 capsules (2 mg total) by mouth at bedtime.  ? ?No current facility-administered medications for this visit. (Other)  ? ? ? ? ?REVIEW OF SYSTEMS: ?ROS   ?Negative for: Constitutional, Gastrointestinal, Neurological, Skin, Genitourinary, Musculoskeletal, HENT, Endocrine, Cardiovascular, Eyes, Respiratory, Psychiatric, Allergic/Imm, Heme/Lymph ?Last edited by Angeline SlimMa, San on 08/12/2021  8:36 AM.  ?  ? ? ? ?ALLERGIES ?Allergies  ?Allergen Reactions  ? Hydrochlorothiazide Other (See Comments)  ?  dizzy  ? ? ?PAST MEDICAL HISTORY ?Past Medical History:  ?Diagnosis Date  ? Diabetes mellitus   ? Edema   ? Essential hypertension, benign   ? Hypertension   ? Obesity, unspecified   ? ?Past Surgical History:  ?Procedure Laterality Date  ? CESAREAN SECTION    ? ? ?FAMILY HISTORY ?Family History  ?Family history unknown: Yes  ? ? ?SOCIAL HISTORY ?Social History  ? ?Tobacco Use  ? Smoking status: Never  ? Smokeless tobacco: Never  ?Substance Use Topics  ? Alcohol use: No  ? Drug use: No  ? ?  ? ?  ? ?OPHTHALMIC  EXAM: ? ?Base Eye Exam   ? ? Visual Acuity (ETDRS)   ? ?   Right Left  ? Dist [place] 20/150 20/40 +2  ? Dist ph [place] 20/100 -1   ? ?  ?  ? ? Tonometry (Tonopen, 8:44 AM)   ? ?   Right Left  ? Pressure 22 23  ? ?  ?  ? ? Pupils   ? ?   Pupils APD  ? Right PERRL None  ? Left PERRL None  ? ?  ?  ? ? Visual Fields   ? ?   Left Right  ?  Full Full  ? ?  ?  ? ? Extraocular Movement   ? ?   Right Left  ?  Full Full  ? ?  ?  ? ? Neuro/Psych   ? ? Oriented x3: Yes  ? Mood/Affect: Normal  ? ?  ?  ? ? Dilation   ? ? Right eye: 2.5% Phenylephrine, 1.0% Mydriacyl @ 8:44 AM  ? ?  ?  ? ?  ? ?Slit Lamp and Fundus Exam   ? ? External Exam   ? ?   Right Left  ? External Normal Normal   ? ?  ?  ? ? Slit Lamp Exam   ? ?   Right Left  ? Lids/Lashes Normal Normal  ? Conjunctiva/Sclera White and quiet White and quiet  ? Cornea Clear Clear  ? Anterior Chamber Deep and quiet Deep and quiet  ? Iris Round and reactive Round and reactive  ? Lens Centered posterior chamber intraocular lens Centered posterior chamber intraocular lens  ? Anterior Vitreous Normal Normal  ? ?  ?  ? ? Fundus Exam   ? ?   Right Left  ? Posterior Vitreous Posterior vitreous detachment   ? Disc Normal   ? C/D Ratio 0.35   ? Macula Intraretinal hemorrhage, Microaneurysms, no clinically significant macular edema, no macular thickening   ? Vessels NPDR-Severe, and findings of intraretinal hemorrhages diffusely like incomplete central retinal vein occlusion with moderate enlargement of vein   ? Periphery Diffuse intraretinal hemorrhages peripherally, moderate scatter laser   ? ?  ?  ? ?  ? ? ?IMAGING AND PROCEDURES  ?Imaging and Procedures for 08/12/21 ? ?OCT, Retina - OU - Both Eyes   ? ?   ?Right Eye ?Quality was good. Scan locations included subfoveal. Central Foveal Thickness: 210. Progression has worsened. Findings include abnormal foveal contour, cystoid macular edema.  ? ?Left Eye ?Quality was good. Scan locations included subfoveal. Central Foveal Thickness: 371. Progression has been stable. Findings include vitreomacular adhesion , abnormal foveal contour, vitreous traction.  ? ?Notes ?OD significant recurrence of CSME macular thickening in the right eye from February, now improved and maintained.  Will need to repeat therapy with antivegF Avastin today ? ?Much less CSME nasal to FAZ and in the FAZ post injection Avastin OS.  Residual vitreous traction elevates the fovea however.  Currently at 5 months postinjection OS ? ?  ? ?Intravitreal Injection, Pharmacologic Agent - OD - Right Eye   ? ?   ?Time Out ?08/12/2021. 9:35 AM. Confirmed correct patient, procedure, site, and patient consented.  ? ?Anesthesia ?Topical anesthesia  was used. Anesthetic medications included Lidocaine 4%.  ? ?Procedure ?Preparation included Tobramycin 0.3%, 10% betadine to eyelids, 5% betadine to ocular surface. A 30 gauge needle was used.  ? ?Injection: ?2.5 mg bevacizumab 2.5 MG/0.1ML ?  Route: Intravitreal, Site: Right Eye ?  NDC: 06301-601-09, Lot: [id number]  ? ?Post-op ?Post injection exam found visual acuity of at least counting fingers. The patient tolerated the procedure well. There were no complications. The patient received written and verbal post procedure care education. Post injection medications were not given.  ? ?  ? ? ?  ?  ? ?  ?ASSESSMENT/PLAN: ? ?Vitreomacular adhesion of left eye ?Tractional changes left eye maintaining cystoid impact on the fovea may very well need vitrectomy as his broad-based attachment is not releasing triggering CSME in the fovea responsive to therapy intravitreal  with antivegF ? 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 ?  ICD-10-CM   ?1. 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  Intravitreal Injection, Pharmacologic Agent - OD - Right Eye  ?  bevacizumab (AVASTIN) SOSY 2.5 mg  ?  ?2. Vitreomacular adhesion of left eye  H43.822   ?  ?3. 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  ? ? ?1.  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 ?2.  OS in this monocular patient still center involved CME ?Vitreomacular traction, not amenable to ongoing anticoagulant therapy ? ?3.  Will discuss next visit  follow-up with  to discuss the possibility using surgical vitrectomy to release this traction ? ? ?Ophthalmic Meds Ordered this visit:  ?Meds ordered this encounter  ?Medications  ? bevacizumab (AVASTIN) SOSY 2

## 2021-08-16 ENCOUNTER — Emergency Department (HOSPITAL_COMMUNITY)
Admission: EM | Admit: 2021-08-16 | Discharge: 2021-08-16 | Disposition: A | Discharge status: Home / Self Care | Payer: Medicare Other | Attending: Emergency Medicine | Admitting: Emergency Medicine

## 2021-08-16 ENCOUNTER — Other Ambulatory Visit: Payer: Self-pay

## 2021-08-16 DIAGNOSIS — I10 Essential (primary) hypertension: Secondary | ICD-10-CM | POA: Insufficient documentation

## 2021-08-16 DIAGNOSIS — Z7982 Long term (current) use of aspirin: Secondary | ICD-10-CM | POA: Diagnosis not present

## 2021-08-16 DIAGNOSIS — Z79899 Other long term (current) drug therapy: Secondary | ICD-10-CM | POA: Diagnosis not present

## 2021-08-16 DIAGNOSIS — Z794 Long term (current) use of insulin: Secondary | ICD-10-CM | POA: Insufficient documentation

## 2021-08-16 LAB — CBC WITH DIFFERENTIAL/PLATELET
Abs Immature Granulocytes: 0.01 10*3/uL (ref 0.00–0.07)
Basophils Absolute: 0 10*3/uL (ref 0.0–0.1)
Basophils Relative: 1 %
Eosinophils Absolute: 0.1 10*3/uL (ref 0.0–0.5)
Eosinophils Relative: 2 %
HCT: 39.5 % (ref 36.0–46.0)
Hemoglobin: 13 g/dL (ref 12.0–15.0)
Immature Granulocytes: 0 %
Lymphocytes Relative: 31 %
Lymphs Abs: 1.2 10*3/uL (ref 0.7–4.0)
MCH: 27.8 pg (ref 26.0–34.0)
MCHC: 32.9 g/dL (ref 30.0–36.0)
MCV: 84.6 fL (ref 80.0–100.0)
Monocytes Absolute: 0.4 10*3/uL (ref 0.1–1.0)
Monocytes Relative: 11 %
Neutro Abs: 2.3 10*3/uL (ref 1.7–7.7)
Neutrophils Relative %: 55 %
Platelets: 184 10*3/uL (ref 150–400)
RBC: 4.67 MIL/uL (ref 3.87–5.11)
RDW: 14.6 % (ref 11.5–15.5)
WBC: 4.1 10*3/uL (ref 4.0–10.5)
nRBC: 0 % (ref 0.0–0.2)

## 2021-08-16 LAB — BASIC METABOLIC PANEL
Anion gap: 8 (ref 5–15)
BUN: 15 mg/dL (ref 8–23)
CO2: 24 mmol/L (ref 22–32)
Calcium: 9.6 mg/dL (ref 8.9–10.3)
Chloride: 105 mmol/L (ref 98–111)
Creatinine, Ser: 1.26 mg/dL — ABNORMAL HIGH (ref 0.44–1.00)
GFR, Estimated: 45 mL/min — ABNORMAL LOW (ref >60)
Glucose, Bld: 145 mg/dL — ABNORMAL HIGH (ref 70–99)
Potassium: 4.3 mmol/L (ref 3.5–5.1)
Sodium: 137 mmol/L (ref 135–145)

## 2021-08-16 MED ORDER — METOPROLOL TARTRATE 25 MG PO TABS
50.0000 mg | ORAL_TABLET | Freq: Once | ORAL | Status: AC
Start: 2021-08-16 — End: 2021-08-16
  Administered 2021-08-16: 50 mg via ORAL
  Filled 2021-08-16: qty 2

## 2021-08-16 MED ORDER — METOPROLOL TARTRATE 25 MG PO TABS: 25.0000 mg | ORAL_TABLET | Freq: Two times a day (BID) | ORAL | 0 refills | Status: DC

## 2021-08-16 NOTE — ED Triage Notes (Signed)
Pt arrived via [facility] from PCP with c/c of HTN. Per [facility] pt was seen for regular check-up when PCP noticed her elevated BP. Pt ambulated from stretcher to bed. Pt states she takes daily BP meds in which she has already taken this morning. Denies any other symptoms include vision changes or headache.  ? ?180/100, 76 HR, CBG 152, 100% RA  ?

## 2021-08-16 NOTE — ED Provider Notes (Signed)
?[facility] EMERGENCY DEPARTMENT ?Provider Note ? ? ?CSN: [id number] ?Arrival date & time: 08/16/21  1100 ? ?  ? ?History ? ?Chief Complaint  ?Patient presents with  ? Hypertension  ? ? ?Ann Gomez is a 71 y.o. female. ? ?Patient presents ER chief complaint of hypertension.  She states she had a routine appointment scheduled with her primary care doctor today.  Upon arrival to her doctor's office blood pressure was over 180 systolic.  She otherwise has no symptoms.  Denies any headache or chest pain or abdominal pain.  No fever no cough no vomiting or diarrhea.  No new numbness or weakness.  She is been taking her blood pressure medication regularly she states.  She was sent over to the ER for management of her blood pressure. ? ? ?  ? ?Home Medications ?Prior to Admission medications   ?Medication Sig Start Date End Date Taking? Authorizing Provider  ?amLODipine (NORVASC) 10 MG tablet Take 10 mg by mouth daily. 11/28/19  Yes [provider]  ?aspirin EC 81 MG tablet Take 1 tablet (81 mg total) by mouth daily. 04/16/12  Yes Ghimire, Werner Lean, MD  ?Cholecalciferol (VITAMIN D3 PO) Take 1 capsule by mouth daily.   Yes [provider]  ?dorzolamide-timolol (COSOPT) 22.3-6.8 MG/ML ophthalmic solution 1 drop 2 (two) times daily. 11/28/19  Yes [provider]  ?Ginger, Zingiber officinalis, (GINGER ROOT PO) Take 550 mg by mouth daily.   Yes [provider]  ?glipiZIDE (GLUCOTROL) 10 MG tablet Take 10 mg by mouth in the morning and at bedtime.   Yes [provider]  ?latanoprost (XALATAN) 0.005 % ophthalmic solution 1 drop at bedtime. 12/17/19  Yes [provider]  ?LEVEMIR FLEXTOUCH 100 UNIT/ML FlexTouch Pen Inject 17 Units into the skin 2 (two) times daily. 04/02/21  Yes [provider]  ?losartan (COZAAR) 100 MG tablet Take 100 mg by mouth daily. 11/28/19  Yes [provider]  ?metFORMIN (GLUCOPHAGE) 1000 MG tablet Take 1 tablet (1,000  mg total) by mouth 2 (two) times daily with a meal. 04/16/12  Yes Ghimire, Werner Lean, MD  ?metoprolol tartrate (LOPRESSOR) 25 MG tablet Take 1 tablet (25 mg total) by mouth 2 (two) times daily. 08/16/21 09/15/21 Yes Cheryll Cockayne, MD  ?furosemide (LASIX) 40 MG tablet Take 1 tablet (40 mg total) by mouth daily. ?Patient not taking: Reported on 08/16/2021 04/16/12   Maretta Bees, MD  ?insulin glargine (LANTUS) 100 UNIT/ML injection Inject 30 Units into the skin every morning. 25 units in the pm ?Patient not taking: Reported on 08/16/2021    [provider]  ?loperamide (IMODIUM) 2 MG capsule Take 1 capsule (2 mg total) by mouth 2 (two) times daily as needed for diarrhea or loose stools. ?Patient not taking: Reported on 08/16/2021 07/12/15   Mackuen, Courteney Lyn, MD  ?ondansetron (ZOFRAN) 4 MG tablet Take 1 tablet (4 mg total) by mouth every 8 (eight) hours as needed for nausea or vomiting. ?Patient not taking: Reported on 08/16/2021 07/12/15   Mackuen, Cindee Salt, MD  ?pravastatin (PRAVACHOL) 20 MG tablet Take 20 mg by mouth at bedtime. ?Patient not taking: Reported on 08/16/2021 11/28/19   [provider]  ?metoprolol succinate (TOPROL-XL) 100 MG 24 hr tablet Take 100 mg by mouth daily. Take with or immediately following a meal.  04/16/12  [provider]  ?rosuvastatin (CRESTOR) 5 MG tablet Take 5 mg by mouth daily.  04/16/12  [provider]  ?   ? ?  Allergies    ?Hydrochlorothiazide   ? ?Review of Systems   ?Review of Systems  ?Constitutional:  Negative for fever.  ?HENT:  Negative for ear pain.   ?Eyes:  Negative for pain.  ?Respiratory:  Negative for cough.   ?Cardiovascular:  Negative for chest pain.  ?Gastrointestinal:  Negative for abdominal pain.  ?Genitourinary:  Negative for flank pain.  ?Musculoskeletal:  Negative for back pain.  ?Skin:  Negative for rash.  ?Neurological:  Negative for headaches.  ? ?Physical Exam ?Updated Vital Signs ?BP (!) 155/128   Pulse 62   Temp  97.8 ?F (36.6 ?C) (Oral)   Resp 19   Ht 5\' 4"  (1.626 m)   Wt 89.4 kg   SpO2 100%   BMI 33.81 kg/m?  ?Physical Exam ?Constitutional:   ?   General: She is not in acute distress. ?   Appearance: Normal appearance.  ?HENT:  ?   Head: Normocephalic.  ?   Nose: Nose normal.  ?Eyes:  ?   Extraocular Movements: Extraocular movements intact.  ?Cardiovascular:  ?   Rate and Rhythm: Normal rate.  ?Pulmonary:  ?   Effort: Pulmonary effort is normal.  ?Musculoskeletal:     ?   General: Normal range of motion.  ?   Cervical back: Normal range of motion.  ?Neurological:  ?   General: No focal deficit present.  ?   Mental Status: She is alert. Mental status is at baseline.  ? ? ?ED Results / Procedures / Treatments   ?Labs ?(all labs ordered are listed, but only abnormal results are displayed) ?Labs Reviewed  ?BASIC METABOLIC PANEL - Abnormal; Notable for the following components:  ?    Result Value  ? Glucose, Bld 145 (*)   ? Creatinine, Ser 1.26 (*)   ? GFR, Estimated 45 (*)   ? All other components within normal limits  ?CBC WITH DIFFERENTIAL/PLATELET  ? ? ?EKG ?None ? ?Radiology ?No results found. ? ?Procedures ?Procedures  ? ? ?Medications Ordered in ED ?Medications  ?metoprolol tartrate (LOPRESSOR) tablet 50 mg (50 mg Oral Given 08/16/21 1119)  ? ? ?ED Course/ Medical Decision Making/ A&P ?  ?                        ?Medical Decision Making ?Amount and/or Complexity of Data Reviewed ?Labs: ordered. ? ?Risk ?Prescription drug management. ? ? ?Cardiac monitoring shows sinus rhythm. ? ?Chart review shows ophthalmology office visit August 12, 2021 for diabetic retinopathy. ? ?Work-up included labs CBC chemistry unremarkable creatinine 1.2 hemoglobin and white count normal. ? ?Patient given metoprolol with significant improvement of blood pressure. ? ?Patient states that her blood pressure medicines were changed to losartan 100 mg daily and amlodipine 10 mg daily. ? ?We will recommend continued outpatient follow-up with her  primary care doctor this week.  Recommending addition of hydralazine 25 mg 3 times daily.  Recommending immediate return for worsening symptoms fevers pain or any additional concerns. ? ? ? ? ? ? ? ?Final Clinical Impression(s) / ED Diagnoses ?Final diagnoses:  ?Hypertension, unspecified type  ? ? ?Rx / DC Orders ?ED Discharge Orders   ? ?      Ordered  ?  metoprolol tartrate (LOPRESSOR) 25 MG tablet  2 times daily       ? 08/16/21 1321  ? ?  ?  ? ?  ? ? ?  ?08/18/21, MD ?08/16/21 1321 ? ?

## 2021-08-16 NOTE — Discharge Instructions (Addendum)
In addition to your losartan and amlodipine.  Take metoprolol 25 mg twice daily. ? ?Follow-up with your doctor within 1 week to reassess your blood pressure.  Your medications may need to be adjusted again. ? ?Return to the ER if you have headache chest pain lightheadedness or any additional concerns. ?

## 2021-09-21 ENCOUNTER — Telehealth: Payer: Self-pay

## 2021-09-21 NOTE — Telephone Encounter (Signed)
Notes scanned to referral 

## 2021-09-28 ENCOUNTER — Encounter (INDEPENDENT_AMBULATORY_CARE_PROVIDER_SITE_OTHER): Payer: Self-pay | Admitting: Ophthalmology

## 2021-09-28 ENCOUNTER — Ambulatory Visit (INDEPENDENT_AMBULATORY_CARE_PROVIDER_SITE_OTHER): Payer: Medicare Other | Admitting: Ophthalmology

## 2021-09-28 DIAGNOSIS — E113512 Type 2 diabetes mellitus with proliferative diabetic retinopathy with macular edema, left eye: Secondary | ICD-10-CM | POA: Diagnosis not present

## 2021-09-28 DIAGNOSIS — H43822 Vitreomacular adhesion, left eye: Secondary | ICD-10-CM

## 2021-09-28 DIAGNOSIS — H35031 Hypertensive retinopathy, right eye: Secondary | ICD-10-CM | POA: Diagnosis not present

## 2021-09-28 DIAGNOSIS — E113511 Type 2 diabetes mellitus with proliferative diabetic retinopathy with macular edema, right eye: Secondary | ICD-10-CM | POA: Diagnosis not present

## 2021-09-28 MED ORDER — BEVACIZUMAB 2.5 MG/0.1ML IZ SOSY
2.5000 mg | PREFILLED_SYRINGE | INTRAVITREAL | Status: AC | PRN
  Administered 2021-09-28: 2.5 mg via INTRAVITREAL

## 2021-09-28 NOTE — Assessment & Plan Note (Signed)
Recurrent CSME OS, now 7 months postinjection, will need repeat injection Avastin near future but still likely need vitrectomy for VMT

## 2021-09-28 NOTE — Progress Notes (Signed)
09/28/2021     CHIEF COMPLAINT Patient presents for  Chief Complaint  Patient presents with   Diabetic Retinopathy with Macular Edema      HISTORY OF PRESENT ILLNESS: Ann Gomez is a 71 y.o. female who presents to the clinic today for:   HPI   6 weeks dilate OU, Avastin OD, OCT. Discuss possible Vitrectomy Left eye for VMT. Patient states vision is stable and unchanged since last visit. Denies any new floaters or FOL. LBS: 142 last checked on Sunday per patient. Patient uses Dorz-tim BID OU, and Latanoprost QH OU. Last edited by Laurin Coder on 09/28/2021  8:42 AM.      Referring physician: Delford Field, [address],  [place] [address]  HISTORICAL INFORMATION:   Selected notes from the MEDICAL RECORD NUMBER    Lab Results  Component Value Date   HGBA1C 9.6 (H) 06/08/2009     CURRENT MEDICATIONS: Current Outpatient Medications (Ophthalmic Drugs)  Medication Sig   dorzolamide-timolol (COSOPT) 22.3-6.8 MG/ML ophthalmic solution 1 drop 2 (two) times daily.   latanoprost (XALATAN) 0.005 % ophthalmic solution 1 drop at bedtime.   No current facility-administered medications for this visit. (Ophthalmic Drugs)   Current Outpatient Medications (Other)  Medication Sig   amLODipine (NORVASC) 10 MG tablet Take 10 mg by mouth daily.   aspirin EC 81 MG tablet Take 1 tablet (81 mg total) by mouth daily.   Cholecalciferol (VITAMIN D3 PO) Take 1 capsule by mouth daily.   furosemide (LASIX) 40 MG tablet Take 1 tablet (40 mg total) by mouth daily. (Patient not taking: Reported on 08/16/2021)   Ginger, Zingiber officinalis, (GINGER ROOT PO) Take 550 mg by mouth daily.   glipiZIDE (GLUCOTROL) 10 MG tablet Take 10 mg by mouth in the morning and at bedtime.   insulin glargine (LANTUS) 100 UNIT/ML injection Inject 30 Units into the skin every morning. 25 units in the pm (Patient not taking: Reported on 08/16/2021)   LEVEMIR FLEXTOUCH 100 UNIT/ML FlexTouch  Pen Inject 17 Units into the skin 2 (two) times daily.   loperamide (IMODIUM) 2 MG capsule Take 1 capsule (2 mg total) by mouth 2 (two) times daily as needed for diarrhea or loose stools. (Patient not taking: Reported on 08/16/2021)   losartan (COZAAR) 100 MG tablet Take 100 mg by mouth daily.   metFORMIN (GLUCOPHAGE) 1000 MG tablet Take 1 tablet (1,000 mg total) by mouth 2 (two) times daily with a meal.   metoprolol tartrate (LOPRESSOR) 25 MG tablet Take 1 tablet (25 mg total) by mouth 2 (two) times daily.   ondansetron (ZOFRAN) 4 MG tablet Take 1 tablet (4 mg total) by mouth every 8 (eight) hours as needed for nausea or vomiting. (Patient not taking: Reported on 08/16/2021)   pravastatin (PRAVACHOL) 20 MG tablet Take 20 mg by mouth at bedtime. (Patient not taking: Reported on 08/16/2021)   No current facility-administered medications for this visit. (Other)      REVIEW OF SYSTEMS: ROS   Negative for: Constitutional, Gastrointestinal, Neurological, Skin, Genitourinary, Musculoskeletal, HENT, Endocrine, Cardiovascular, Eyes, Respiratory, Psychiatric, Allergic/Imm, Heme/Lymph Last edited by Hurman Horn, MD on 09/28/2021  9:07 AM.       ALLERGIES Allergies  Allergen Reactions   Hydrochlorothiazide Other (See Comments)    dizzy    PAST MEDICAL HISTORY Past Medical History:  Diagnosis Date   Diabetes mellitus    Edema    Essential hypertension, benign    Hypertension  Obesity, unspecified    Past Surgical History:  Procedure Laterality Date   CESAREAN SECTION      FAMILY HISTORY Family History  Family history unknown: Yes    SOCIAL HISTORY Social History   Tobacco Use   Smoking status: Never   Smokeless tobacco: Never  Substance Use Topics   Alcohol use: No   Drug use: No         OPHTHALMIC EXAM:  Base Eye Exam     Visual Acuity (ETDRS)       Right Left   Dist [place] 20/125 -2 20/40   Dist ph [place] 20/125 -1 NI         Tonometry (Tonopen, 8:49 AM)        Right Left   Pressure 28 23         Tonometry #2 (Tonopen, 8:49 AM)       Right Left   Pressure 26 18         Pupils       Pupils APD   Right PERRL None   Left PERRL None         Extraocular Movement       Right Left    Full Full         Neuro/Psych     Oriented x3: Yes   Mood/Affect: Normal         Dilation     Both eyes: 1.0% Mydriacyl, 2.5% Phenylephrine @ 8:49 AM           Slit Lamp and Fundus Exam     External Exam       Right Left   External Normal Normal         Slit Lamp Exam       Right Left   Lids/Lashes Normal Normal   Conjunctiva/Sclera White and quiet White and quiet   Cornea Clear Clear   Anterior Chamber Deep and quiet Deep and quiet   Iris Round and reactive Round and reactive   Lens Centered posterior chamber intraocular lens Centered posterior chamber intraocular lens   Anterior Vitreous Normal Normal         Fundus Exam       Right Left   Posterior Vitreous Posterior vitreous detachment Normal   Disc Normal No active N/V, Vitt Pap traction of is clinically visible   C/D Ratio 0.35 0.45   Macula Intraretinal hemorrhage, Microaneurysms, no clinically significant macular edema, no macular thickening Microaneurysms, Mild clinically significant macular edema   Vessels NPDR-Severe, and findings of intraretinal hemorrhages diffusely like incomplete central retinal vein occlusion with moderate enlargement of vein, now posterior pole cotton-wool spot PDR-active, white lines of retinal nonperfusion nasally and inferiorly on clinical exam   Periphery Diffuse intraretinal hemorrhages peripherally, moderate scatter laser Dot-blot hemorrhage, moderate scatter laser            IMAGING AND PROCEDURES  Imaging and Procedures for 09/28/21  Intravitreal Injection, Pharmacologic Agent - OD - Right Eye       Time Out 09/28/2021. 9:11 AM. Confirmed correct patient, procedure, site, and patient consented.    Anesthesia Topical anesthesia was used. Anesthetic medications included Lidocaine 4%.   Procedure Preparation included Tobramycin 0.3%, 10% betadine to eyelids, 5% betadine to ocular surface. A 30 gauge needle was used.   Injection: 2.5 mg bevacizumab 2.5 MG/0.1ML   Route: Intravitreal, Site: Right Eye   NDC: [phone], Lot: [id number], Expiration date: 11/21/2021   Post-op Post injection exam found visual acuity of  at least counting fingers. The patient tolerated the procedure well. There were no complications. The patient received written and verbal post procedure care education. Post injection medications were not given.      OCT, Retina - OU - Both Eyes       Right Eye Quality was good. Scan locations included subfoveal. Central Foveal Thickness: 210. Progression has worsened. Findings include abnormal foveal contour, cystoid macular edema, vitreomacular adhesion .   Left Eye Quality was good. Scan locations included subfoveal. Central Foveal Thickness: 371. Progression has been stable. Findings include vitreomacular adhesion , abnormal foveal contour, vitreous traction.   Notes OD resolution of recent CSME macular thickening in the right eye from February, now improved and maintained.  Will need to repeat therapy with antivegF Avastin today at current interval of 6-week, 5-day  Recurrent CSME nasal to FAZ and in the FAZ at 7 months post injection Avastin OS.  Residual vitreous traction elevates the fovea however.               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 Grade 2 hypertensive retinopathy, right  H35.031     3. 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 Vitreomacular adhesion of left eye  H43.822       1.  OD, with improvement in CSME yet still vision limited by foveal atrophy.  Repeat injection today to maintain an essentially 7-week interval.  Next OD in 7-week  2.  OS with recurrence of CSME 7 months post most recent injection.  Likely related to current poorly controlled blood pressure.  3.  Review of systems for sleep apnea reviewed.  Patient does not report any symptoms at this time. 4.  Will need resumption of antivegF left eye over the next 2 weeks and likely will need vitrectomy for residual VMT tractional in the macula left eye thereafter  Ophthalmic Meds Ordered this visit:  Meds ordered this encounter  Medications   bevacizumab (AVASTIN) SOSY 2.5 mg       Return in about 2 weeks (around 10/12/2021) for dilate, OS, AVASTIN OCT,, and preop vitrectomy OS, VMT.  There are no Patient Instructions on file for this visit.   Explained the diagnoses, plan,  and follow up with the patient and  they expressed understanding.  Patient expressed understanding of the importance of proper follow up care.   Clent Demark Daelin Haste M.D. Diseases & Surgery of the Retina and Vitreous Retina & Diabetic [place] 09/28/21     Abbreviations: M myopia (nearsighted); A astigmatism; H hyperopia (farsighted); P presbyopia; Mrx spectacle prescription;  CTL contact lenses; OD right eye; OS left eye; OU both eyes  XT exotropia; ET esotropia; PEK punctate epithelial keratitis; PEE punctate epithelial erosions; DES dry eye syndrome; MGD meibomian gland dysfunction; ATs artificial tears; PFAT's preservative free artificial tears; [place] nuclear sclerotic cataract; PSC posterior subcapsular cataract; ERM epi-retinal membrane; PVD posterior vitreous detachment; RD retinal detachment; DM diabetes mellitus; DR diabetic retinopathy; NPDR non-proliferative diabetic retinopathy; PDR proliferative diabetic retinopathy; CSME clinically significant macular edema; DME diabetic macular edema; dbh dot blot hemorrhages; CWS cotton wool spot; POAG primary open angle glaucoma; C/D cup-to-disc ratio; HVF humphrey visual field; GVF goldmann visual field; OCT optical coherence tomography; IOP intraocular pressure; BRVO Branch retinal vein occlusion; CRVO central retinal vein occlusion; CRAO central retinal artery occlusion; BRAO branch retinal artery occlusion; RT retinal tear; SB scleral buckle; PPV pars plana vitrectomy; VH Vitreous hemorrhage; PRP panretinal laser photocoagulation; IVK intravitreal kenalog; VMT vitreomacular traction; MH Macular hole;  NVD neovascularization of the disc; NVE neovascularization elsewhere; AREDS age related eye disease study; ARMD age related macular degeneration; POAG primary open angle glaucoma; EBMD epithelial/anterior basement membrane dystrophy; ACIOL anterior chamber intraocular lens; IOL intraocular lens; PCIOL posterior chamber intraocular lens; Phaco/IOL  phacoemulsification with intraocular lens placement; [place] photorefractive keratectomy; LASIK laser assisted in situ keratomileusis; HTN hypertension; DM diabetes mellitus; COPD chronic obstructive pulmonary disease

## 2021-09-28 NOTE — Assessment & Plan Note (Signed)
Tractional changes persist on the fovea elevating it even despite previous resolution of CSME, now contributing to current CSME

## 2021-09-28 NOTE — Assessment & Plan Note (Signed)
Severe posterior pole cotton-wool spots persist, as patient reports continued poor  blood pressure control

## 2021-09-28 NOTE — Assessment & Plan Note (Signed)
OD, with recurrent CSME now improved to Avastin currently at 6-week 5-day post injection interval.  Repeat injection today to maintain

## 2021-10-12 ENCOUNTER — Ambulatory Visit (INDEPENDENT_AMBULATORY_CARE_PROVIDER_SITE_OTHER): Payer: Medicare Other | Admitting: Ophthalmology

## 2021-10-12 ENCOUNTER — Encounter (INDEPENDENT_AMBULATORY_CARE_PROVIDER_SITE_OTHER): Payer: Self-pay | Admitting: Ophthalmology

## 2021-10-12 DIAGNOSIS — H34811 Central retinal vein occlusion, right eye, with macular edema: Secondary | ICD-10-CM

## 2021-10-12 DIAGNOSIS — H43822 Vitreomacular adhesion, left eye: Secondary | ICD-10-CM | POA: Diagnosis not present

## 2021-10-12 DIAGNOSIS — E113512 Type 2 diabetes mellitus with proliferative diabetic retinopathy with macular edema, left eye: Secondary | ICD-10-CM | POA: Diagnosis not present

## 2021-10-12 DIAGNOSIS — H35372 Puckering of macula, left eye: Secondary | ICD-10-CM

## 2021-10-12 MED ORDER — PREDNISOLONE ACETATE 1 % OP SUSP: 1.0000 [drp] | Freq: Four times a day (QID) | OPHTHALMIC | 0 refills | Status: AC

## 2021-10-12 MED ORDER — BEVACIZUMAB 2.5 MG/0.1ML IZ SOSY
2.5000 mg | PREFILLED_SYRINGE | INTRAVITREAL | Status: AC | PRN
  Administered 2021-10-12: 2.5 mg via INTRAVITREAL

## 2021-10-12 MED ORDER — OFLOXACIN 0.3 % OP SOLN: 1.0000 [drp] | Freq: Four times a day (QID) | OPHTHALMIC | 0 refills | Status: DC

## 2021-10-12 NOTE — Assessment & Plan Note (Signed)
Component of VMT as well.

## 2021-10-12 NOTE — Assessment & Plan Note (Signed)
OS center involved CSME and perimacular.  PDR appears active, traction on the fovea accounts for some residual CSME  Repeat injection Avastin today to control and will schedule vitrectomy membrane peel left eye for long-term control

## 2021-10-12 NOTE — Assessment & Plan Note (Signed)
Residual atrophy OD accounts for acuity OD

## 2021-10-12 NOTE — Progress Notes (Signed)
10/12/2021     CHIEF COMPLAINT Patient presents for  Chief Complaint  Patient presents with   Pre-op Exam   Diabetic Retinopathy with Macular Edema      HISTORY OF PRESENT ILLNESS: Ann Gomez is a 71 y.o. female who presents to the clinic today for:   HPI   2 weeks dilate OS, Avastin OCT OS, and preop Vitrectomy OS, VMT. Patient states vision is stable and unchanged since last visit. Denies any new floaters or FOL. Patient uses Dorz-Tim BID OU, Latanoprost QHS OU.  Last edited by Laurin Coder on 10/12/2021  8:30 AM.      Referring physician: Delford Field, [address],  [place] [address]  HISTORICAL INFORMATION:   Selected notes from the MEDICAL RECORD NUMBER    Lab Results  Component Value Date   HGBA1C 9.6 (H) 06/08/2009     CURRENT MEDICATIONS: Current Outpatient Medications (Ophthalmic Drugs)  Medication Sig   ofloxacin (OCUFLOX) 0.3 % ophthalmic solution Place 1 drop into the left eye in the morning, at noon, in the evening, and at bedtime for 21 days.   prednisoLONE acetate (PRED FORTE) 1 % ophthalmic suspension Place 1 drop into the left eye 4 (four) times daily for 21 days.   dorzolamide-timolol (COSOPT) 22.3-6.8 MG/ML ophthalmic solution 1 drop 2 (two) times daily.   latanoprost (XALATAN) 0.005 % ophthalmic solution 1 drop at bedtime.   No current facility-administered medications for this visit. (Ophthalmic Drugs)   Current Outpatient Medications (Other)  Medication Sig   amLODipine (NORVASC) 10 MG tablet Take 10 mg by mouth daily.   aspirin EC 81 MG tablet Take 1 tablet (81 mg total) by mouth daily.   Cholecalciferol (VITAMIN D3 PO) Take 1 capsule by mouth daily.   furosemide (LASIX) 40 MG tablet Take 1 tablet (40 mg total) by mouth daily. (Patient not taking: Reported on 08/16/2021)   Ginger, Zingiber officinalis, (GINGER ROOT PO) Take 550 mg by mouth daily.   glipiZIDE (GLUCOTROL) 10 MG tablet Take 10 mg by mouth in  the morning and at bedtime.   insulin glargine (LANTUS) 100 UNIT/ML injection Inject 30 Units into the skin every morning. 25 units in the pm (Patient not taking: Reported on 08/16/2021)   LEVEMIR FLEXTOUCH 100 UNIT/ML FlexTouch Pen Inject 17 Units into the skin 2 (two) times daily.   loperamide (IMODIUM) 2 MG capsule Take 1 capsule (2 mg total) by mouth 2 (two) times daily as needed for diarrhea or loose stools. (Patient not taking: Reported on 08/16/2021)   losartan (COZAAR) 100 MG tablet Take 100 mg by mouth daily.   metFORMIN (GLUCOPHAGE) 1000 MG tablet Take 1 tablet (1,000 mg total) by mouth 2 (two) times daily with a meal.   metoprolol tartrate (LOPRESSOR) 25 MG tablet Take 1 tablet (25 mg total) by mouth 2 (two) times daily.   ondansetron (ZOFRAN) 4 MG tablet Take 1 tablet (4 mg total) by mouth every 8 (eight) hours as needed for nausea or vomiting. (Patient not taking: Reported on 08/16/2021)   pravastatin (PRAVACHOL) 20 MG tablet Take 20 mg by mouth at bedtime. (Patient not taking: Reported on 08/16/2021)   No current facility-administered medications for this visit. (Other)      REVIEW OF SYSTEMS: ROS   Negative for: Constitutional, Gastrointestinal, Neurological, Skin, Genitourinary, Musculoskeletal, HENT, Endocrine, Cardiovascular, Eyes, Respiratory, Psychiatric, Allergic/Imm, Heme/Lymph Last edited by Hurman Horn, MD on 10/12/2021  9:28 AM.  ALLERGIES Allergies  Allergen Reactions   Hydrochlorothiazide Other (See Comments)    dizzy    PAST MEDICAL HISTORY Past Medical History:  Diagnosis Date   Diabetes mellitus    Edema    Essential hypertension, benign    Hypertension    Obesity, unspecified    Past Surgical History:  Procedure Laterality Date   CESAREAN SECTION      FAMILY HISTORY Family History  Family history unknown: Yes    SOCIAL HISTORY Social History   Tobacco Use   Smoking status: Never   Smokeless tobacco: Never  Substance Use Topics    Alcohol use: No   Drug use: No         OPHTHALMIC EXAM:  Base Eye Exam     Visual Acuity (ETDRS)       Right Left   Dist [place] 20/125 -1+2 20/40   Dist ph [place] 20/30 -2         Tonometry (Tonopen, 8:38 AM)       Right Left   Pressure 27 20         Tonometry #2 (Tonopen, 8:39 AM)       Right Left   Pressure 19 18         Pupils       Pupils APD   Right PERRL None   Left PERRL None         Extraocular Movement       Right Left    Full Full         Neuro/Psych     Oriented x3: Yes   Mood/Affect: Normal         Dilation     Left eye: 1.0% Mydriacyl, 2.5% Phenylephrine @ 8:38 AM           Slit Lamp and Fundus Exam     External Exam       Right Left   External Normal Normal         Slit Lamp Exam       Right Left   Lids/Lashes Normal Normal   Conjunctiva/Sclera White and quiet White and quiet   Cornea Clear Clear   Anterior Chamber Deep and quiet Deep and quiet   Iris Round and reactive Round and reactive   Lens Centered posterior chamber intraocular lens Centered posterior chamber intraocular lens   Anterior Vitreous Normal Normal         Fundus Exam       Right Left   Posterior Vitreous Posterior vitreous detachment Normal   Disc Normal No active N/V, Vitt Pap traction of is clinically visible   C/D Ratio 0.35 0.45   Macula Intraretinal hemorrhage, Microaneurysms, no clinically significant macular edema, no macular thickening Microaneurysms, Mild clinically significant macular edema   Vessels NPDR-Severe, and findings of intraretinal hemorrhages diffusely like incomplete central retinal vein occlusion with moderate enlargement of vein, now posterior pole cotton-wool spot PDR-active, white lines of retinal nonperfusion nasally and inferiorly on clinical exam   Periphery Diffuse intraretinal hemorrhages peripherally, moderate scatter laser Dot-blot hemorrhage, moderate scatter laser            IMAGING AND  PROCEDURES  Imaging and Procedures for 10/12/21  OCT, Retina - OU - Both Eyes       Right Eye Quality was good. Scan locations included subfoveal. Central Foveal Thickness: 212. Progression has worsened. Findings include abnormal foveal contour, cystoid macular edema, vitreomacular adhesion .   Left Eye Quality was good. Scan  locations included subfoveal. Central Foveal Thickness: 409. Progression has been stable. Findings include abnormal foveal contour, vitreous traction, vitreomacular adhesion .   Notes OD resolution of recent CSME macular thickening in the right eye from February, now improved and maintained.  Diffuse atrophy present   Recurrent CSME nasal to FAZ and in the FAZ at  Avastin OS.  Residual vitreous traction elevates the fovea however.  Recurrent at 7 months post most recent injection OS      Intravitreal Injection, Pharmacologic Agent - OS - Left Eye       Time Out 10/12/2021. 9:30 AM. Confirmed correct patient, procedure, site, and patient consented.   Anesthesia Topical anesthesia was used. Anesthetic medications included Lidocaine 4%.   Procedure Preparation included 5% betadine to ocular surface, 10% betadine to eyelids, Tobramycin 0.3%. A 30 gauge needle was used.   Injection: 2.5 mg bevacizumab 2.5 MG/0.1ML   Route: Intravitreal, Site: Left Eye   NDC: [phone], Lot: [id number], Expiration date: 11/21/2021   Post-op Post injection exam found visual acuity of at least counting fingers. The patient tolerated the procedure well. There were no complications. The patient received written and verbal post procedure care education. Post injection medications included ocuflox.              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 type 2 diabetes mellitus (HCC) OS center involved CSME and perimacular.  PDR appears active, traction on the fovea accounts for some residual CSME  Repeat injection  Avastin today to control and will schedule vitrectomy membrane peel left eye for long-term control  Macular pucker, left eye Component of VMT as well.  Vitreomacular adhesion of left eye Tractional roll on the fovea visually impactful  Central retinal vein occlusion with macular edema of right eye Residual atrophy OD accounts for acuity OD     ICD-10-CM   1. 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 Macular pucker, left eye  H35.372     3. Vitreomacular adhesion of left eye  H43.822     4. Central retinal vein occlusion with macular edema of right eye  H34.8110       1.  OS with residual center involved CSME some component of VMT and ERM exist, needs control of PDR OS today preoperatively in anticipation of vitrectomy membrane peel, endolaser PRP left eye  2.  3.  Ophthalmic Meds Ordered this visit:  Meds ordered this encounter  Medications   prednisoLONE acetate (PRED FORTE) 1 % ophthalmic suspension    Sig: Place 1 drop into the left eye 4 (four) times daily for 21 days.    Dispense:  5 mL    Refill:  0   ofloxacin (OCUFLOX) 0.3 % ophthalmic solution    Sig: Place 1 drop into the left eye in the morning, at noon, in the evening, and at bedtime for 21 days.    Dispense:  5 mL    Refill:  0   bevacizumab (AVASTIN) SOSY 2.5 mg       Return ,, [facility], [facility],, Q59.5638,, V56.433, for Schedule vitrectomy, PRP, OS,, [id number].  There are no Patient Instructions on file for this visit.   Explained the diagnoses, plan, and follow up with  the patient and they expressed understanding.  Patient expressed understanding of the importance of proper follow up care.   Alford Highland Woodrow Drab M.D. Diseases & Surgery of the Retina and Vitreous [facility] 10/12/21     Abbreviations: M myopia (nearsighted); A astigmatism; H hyperopia (farsighted); P presbyopia; Mrx spectacle prescription;  CTL contact lenses; OD right eye; OS left eye; OU both eyes  XT exotropia; ET esotropia; PEK punctate epithelial keratitis; PEE punctate epithelial erosions; DES dry eye syndrome; MGD meibomian gland dysfunction; ATs artificial tears; PFAT's preservative free artificial tears; NSC nuclear sclerotic cataract; PSC posterior subcapsular cataract; ERM epi-retinal membrane; PVD posterior vitreous detachment; RD retinal detachment; DM diabetes mellitus; DR diabetic retinopathy; NPDR non-proliferative diabetic retinopathy; PDR proliferative diabetic retinopathy; CSME clinically significant macular edema; DME diabetic macular edema; dbh dot blot hemorrhages; CWS cotton wool spot; POAG primary open angle glaucoma; C/D cup-to-disc ratio; HVF humphrey visual field; GVF goldmann visual field; OCT optical coherence tomography; IOP intraocular pressure; BRVO Branch retinal vein occlusion; CRVO central retinal vein occlusion; CRAO central retinal artery occlusion; BRAO branch retinal artery occlusion; RT retinal tear; SB scleral buckle; PPV pars plana vitrectomy; VH Vitreous hemorrhage; PRP panretinal laser photocoagulation; IVK intravitreal kenalog; VMT vitreomacular traction; MH Macular hole;  NVD neovascularization of the disc; NVE neovascularization elsewhere; AREDS age related eye disease study; ARMD age related macular degeneration; POAG primary open angle glaucoma; EBMD epithelial/anterior basement membrane dystrophy; ACIOL anterior chamber intraocular lens; IOL intraocular lens; PCIOL posterior chamber intraocular lens; Phaco/IOL phacoemulsification with intraocular lens placement; PRK photorefractive keratectomy; LASIK laser assisted in situ keratomileusis; HTN hypertension; DM diabetes mellitus; COPD chronic obstructive pulmonary disease

## 2021-10-12 NOTE — Assessment & Plan Note (Signed)
Tractional roll on the fovea visually impactful

## 2021-10-18 ENCOUNTER — Other Ambulatory Visit (INDEPENDENT_AMBULATORY_CARE_PROVIDER_SITE_OTHER): Payer: Medicare Other | Admitting: Ophthalmology

## 2021-10-18 ENCOUNTER — Other Ambulatory Visit (INDEPENDENT_AMBULATORY_CARE_PROVIDER_SITE_OTHER): Payer: Self-pay

## 2021-10-18 ENCOUNTER — Telehealth (INDEPENDENT_AMBULATORY_CARE_PROVIDER_SITE_OTHER): Payer: Self-pay

## 2021-10-18 MED ORDER — MOXIFLOXACIN HCL 0.5 % OP SOLN: 1.0000 [drp] | Freq: Three times a day (TID) | OPHTHALMIC | 0 refills | Status: AC

## 2021-10-20 ENCOUNTER — Encounter (AMBULATORY_SURGERY_CENTER): Payer: Medicare Other | Admitting: Ophthalmology

## 2021-10-20 DIAGNOSIS — H43822 Vitreomacular adhesion, left eye: Secondary | ICD-10-CM

## 2021-10-20 DIAGNOSIS — E113522 Type 2 diabetes mellitus with proliferative diabetic retinopathy with traction retinal detachment involving the macula, left eye: Secondary | ICD-10-CM | POA: Diagnosis not present

## 2021-10-20 DIAGNOSIS — H35372 Puckering of macula, left eye: Secondary | ICD-10-CM

## 2021-10-21 ENCOUNTER — Ambulatory Visit (INDEPENDENT_AMBULATORY_CARE_PROVIDER_SITE_OTHER): Payer: Medicare Other | Admitting: Ophthalmology

## 2021-10-21 ENCOUNTER — Encounter (INDEPENDENT_AMBULATORY_CARE_PROVIDER_SITE_OTHER): Payer: Self-pay | Admitting: Ophthalmology

## 2021-10-21 DIAGNOSIS — E113512 Type 2 diabetes mellitus with proliferative diabetic retinopathy with macular edema, left eye: Secondary | ICD-10-CM

## 2021-10-21 NOTE — Assessment & Plan Note (Signed)
Postop day #1 looks great post vitrectomy, release of VMT and ILM peel for persistent CSME.  Additional PRP also delivered.

## 2021-10-21 NOTE — Patient Instructions (Signed)
Ofloxacin  4 times daily to the operative eye left eye  Prednisolone acetate 1 drop to the operative eye 4 times daily left eye  Patient instructed not to refill the medications and use them for maximum of 3 weeks.  Patient instructed do not rub the eye.  Patient has the option to use the patch at night.   Continue other topical medications for glaucoma in each eye as otherwise prescribed by other physician  No lifting and bending for 1 week. No water IN the eye for 10 days. Do not rub the eye. Wear shield at night for 1-3 days.  Continue your topical medications for a total of 3 weeks.  Do not refill your postoperative medications unless instructed.  Refrain from exercise or intentional activity which increases our heart rate above resting levels.  Normal walking to complete normal activities of your day are appropriate.  Driving:  Legally, you only need one good eye, of 20/40 or better to drive.  However, the practice does not recommend driving during first weeks after surgery, IF you are uncomfortable with your visual functioning or capabilities.   If you have known sleep apnea, wear your CPAP as you normally should.

## 2021-10-21 NOTE — Progress Notes (Signed)
10/21/2021     CHIEF COMPLAINT Patient presents for  Chief Complaint  Patient presents with   Post-op Follow-up      HISTORY OF PRESENT ILLNESS: Ann Gomez is a 71 y.o. female who presents to the clinic today for:   HPI     Post-op Follow-up           Laterality: left eye   Discomfort: Negative for pain, itching, foreign body sensation, tearing, discharge and floaters   Vision: is improved         Comments   1 DAY POST OP OS SX 10/20/21.  S/P vitrectomy, release of VMT, membrane peel of ILM OS Pt stated vision has improved.        Last edited by Edmon Crape, MD on 10/21/2021  8:45 AM.      Referring physician: Sherral Hammers, FNP [phone] [address] [place],  [place] [address]  HISTORICAL INFORMATION:   Selected notes from the MEDICAL RECORD NUMBER    Lab Results  Component Value Date   HGBA1C 9.6 (H) 06/08/2009     CURRENT MEDICATIONS: Current Outpatient Medications (Ophthalmic Drugs)  Medication Sig   dorzolamide-timolol (COSOPT) 22.3-6.8 MG/ML ophthalmic solution 1 drop 2 (two) times daily.   latanoprost (XALATAN) 0.005 % ophthalmic solution 1 drop at bedtime.   moxifloxacin (VIGAMOX) 0.5 % ophthalmic solution Place 1 drop into the left eye 3 (three) times daily for 7 days.   prednisoLONE acetate (PRED FORTE) 1 % ophthalmic suspension Place 1 drop into the left eye 4 (four) times daily for 21 days.   No current facility-administered medications for this visit. (Ophthalmic Drugs)   Current Outpatient Medications (Other)  Medication Sig   amLODipine (NORVASC) 10 MG tablet Take 10 mg by mouth daily.   aspirin EC 81 MG tablet Take 1 tablet (81 mg total) by mouth daily.   Cholecalciferol (VITAMIN D3 PO) Take 1 capsule by mouth daily.   furosemide (LASIX) 40 MG tablet Take 1 tablet (40 mg total) by mouth daily. (Patient not taking: Reported on 08/16/2021)   Ginger, Zingiber officinalis, (GINGER ROOT PO) Take 550 mg by mouth daily.   glipiZIDE  (GLUCOTROL) 10 MG tablet Take 10 mg by mouth in the morning and at bedtime.   insulin glargine (LANTUS) 100 UNIT/ML injection Inject 30 Units into the skin every morning. 25 units in the pm (Patient not taking: Reported on 08/16/2021)   LEVEMIR FLEXTOUCH 100 UNIT/ML FlexTouch Pen Inject 17 Units into the skin 2 (two) times daily.   loperamide (IMODIUM) 2 MG capsule Take 1 capsule (2 mg total) by mouth 2 (two) times daily as needed for diarrhea or loose stools. (Patient not taking: Reported on 08/16/2021)   losartan (COZAAR) 100 MG tablet Take 100 mg by mouth daily.   metFORMIN (GLUCOPHAGE) 1000 MG tablet Take 1 tablet (1,000 mg total) by mouth 2 (two) times daily with a meal.   metoprolol tartrate (LOPRESSOR) 25 MG tablet Take 1 tablet (25 mg total) by mouth 2 (two) times daily.   ondansetron (ZOFRAN) 4 MG tablet Take 1 tablet (4 mg total) by mouth every 8 (eight) hours as needed for nausea or vomiting. (Patient not taking: Reported on 08/16/2021)   pravastatin (PRAVACHOL) 20 MG tablet Take 20 mg by mouth at bedtime. (Patient not taking: Reported on 08/16/2021)   No current facility-administered medications for this visit. (Other)      REVIEW OF SYSTEMS: ROS   Negative for: Constitutional, Gastrointestinal, Neurological, Skin, Genitourinary,  Musculoskeletal, HENT, Endocrine, Cardiovascular, Eyes, Respiratory, Psychiatric, Allergic/Imm, Heme/Lymph Last edited by Angeline Slim on 10/21/2021  8:17 AM.       ALLERGIES Allergies  Allergen Reactions   Hydrochlorothiazide Other (See Comments)    dizzy    PAST MEDICAL HISTORY Past Medical History:  Diagnosis Date   Diabetes mellitus    Edema    Essential hypertension, benign    Hypertension    Obesity, unspecified    Past Surgical History:  Procedure Laterality Date   CESAREAN SECTION      FAMILY HISTORY Family History  Family history unknown: Yes    SOCIAL HISTORY Social History   Tobacco Use   Smoking status: Never   Smokeless  tobacco: Never  Substance Use Topics   Alcohol use: No   Drug use: No         OPHTHALMIC EXAM:  Base Eye Exam     Visual Acuity (ETDRS)       Right Left   Dist [place] 20/100 -2 20/50 -2   Dist ph [place] 20/80 -2 20/40 -2         Tonometry (Tonopen, 8:24 AM)       Right Left   Pressure 11 20         Pupils       Pupils APD   Right PERRL None   Left PERRL None         Visual Fields       Left Right    Full Full         Extraocular Movement       Right Left    Full Full         Neuro/Psych     Oriented x3: Yes   Mood/Affect: Normal         Dilation     Left eye: 2.5% Phenylephrine, 1.0% Mydriacyl @ 8:24 AM           Slit Lamp and Fundus Exam     External Exam       Right Left   External Normal Normal         Slit Lamp Exam       Right Left   Lids/Lashes Normal Normal   Conjunctiva/Sclera White and quiet White and quiet   Cornea Clear Clear   Anterior Chamber Deep and quiet Deep and quiet   Iris Round and reactive Round and reactive   Lens Centered posterior chamber intraocular lens Centered posterior chamber intraocular lens   Anterior Vitreous Normal Normal         Fundus Exam       Right Left   Posterior Vitreous Posterior vitreous detachment Normal   Disc Normal No residual vitreal papillary traction,   C/D Ratio 0.35 0.45   Macula Intraretinal hemorrhage, Microaneurysms, no clinically significant macular edema, no macular thickening Microaneurysms, Mild clinically significant macular edema, no VMT, post ILM changes   Vessels NPDR-Severe, and findings of intraretinal hemorrhages diffusely like incomplete central retinal vein occlusion with moderate enlargement of vein, now posterior pole cotton-wool spot PDR-quiet with good PRP now peripherally and posteriorly   Periphery Diffuse intraretinal hemorrhages peripherally, moderate scatter laser Dot-blot hemorrhage, complete PRP now            IMAGING AND PROCEDURES   Imaging and Procedures for 10/21/21           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 type 2 diabetes mellitus (HCC) Postop  day #1 looks great post vitrectomy, release of VMT and ILM peel for persistent CSME.  Additional PRP also delivered.     ICD-10-CM   1. Proliferative diabetic retinopathy of left eye with macular edema associated with type 2 diabetes mellitus (HCC)  Y78.2956       1.  OS looks great today, good acuity, patient encouraged not to mass compression or rub the eye.  No heavy lifting no intentional activity no intentional exercise for 10 days  2.  3.  Ophthalmic Meds Ordered this visit:  No orders of the defined types were placed in this encounter.      Return in about 1 week (around 10/28/2021) for dilate, OS, POST OP, OCT.  Patient Instructions  Ofloxacin  4 times daily to the operative eye left eye  Prednisolone acetate 1 drop to the operative eye 4 times daily left eye  Patient instructed not to refill the medications and use them for maximum of 3 weeks.  Patient instructed do not rub the eye.  Patient has the option to use the patch at night.   Continue other topical medications for glaucoma in each eye as otherwise prescribed by other physician  No lifting and bending for 1 week. No water IN the eye for 10 days. Do not rub the eye. Wear shield at night for 1-3 days.  Continue your topical medications for a total of 3 weeks.  Do not refill your postoperative medications unless instructed.  Refrain from exercise or intentional activity which increases our heart rate above resting levels.  Normal walking to complete normal activities of your day are appropriate.  Driving:  Legally, you only need one good eye, of 20/40 or better to drive.  However, the practice does not recommend driving during first weeks after surgery, IF you are uncomfortable with your visual functioning or  capabilities.   If you have known sleep apnea, wear your CPAP as you normally should.     Explained the diagnoses, plan, and follow up with the patient and they expressed understanding.  Patient expressed understanding of the importance of proper follow up care.   Alford Highland Tamer Baughman M.D. Diseases & Surgery of the Retina and Vitreous [facility] 10/21/21     Abbreviations: M myopia (nearsighted); A astigmatism; H hyperopia (farsighted); P presbyopia; Mrx spectacle prescription;  CTL contact lenses; OD right eye; OS left eye; OU both eyes  XT exotropia; ET esotropia; PEK punctate epithelial keratitis; PEE punctate epithelial erosions; DES dry eye syndrome; MGD meibomian gland dysfunction; ATs artificial tears; PFAT's preservative free artificial tears; NSC nuclear sclerotic cataract; PSC posterior subcapsular cataract; ERM epi-retinal membrane; PVD posterior vitreous detachment; RD retinal detachment; DM diabetes mellitus; DR diabetic retinopathy; NPDR non-proliferative diabetic retinopathy; PDR proliferative diabetic retinopathy; CSME clinically significant macular edema; DME diabetic macular edema; dbh dot blot hemorrhages; CWS cotton wool spot; POAG primary open angle glaucoma; C/D cup-to-disc ratio; HVF humphrey visual field; GVF goldmann visual field; OCT optical coherence tomography; IOP intraocular pressure; BRVO Branch retinal vein occlusion; CRVO central retinal vein occlusion; CRAO central retinal artery occlusion; BRAO branch retinal artery occlusion; RT retinal tear; SB scleral buckle; PPV pars plana vitrectomy; VH Vitreous hemorrhage; PRP panretinal laser photocoagulation; IVK intravitreal kenalog; VMT vitreomacular traction; MH Macular hole;  NVD neovascularization of the disc; NVE neovascularization elsewhere; AREDS age related eye disease study; ARMD age related macular degeneration; POAG primary open angle glaucoma; EBMD epithelial/anterior basement membrane dystrophy;  ACIOL anterior chamber intraocular lens; IOL intraocular  lens; PCIOL posterior chamber intraocular lens; Phaco/IOL phacoemulsification with intraocular lens placement; PRK photorefractive keratectomy; LASIK laser assisted in situ keratomileusis; HTN hypertension; DM diabetes mellitus; COPD chronic obstructive pulmonary disease 

## 2021-10-28 ENCOUNTER — Ambulatory Visit (INDEPENDENT_AMBULATORY_CARE_PROVIDER_SITE_OTHER): Payer: Medicare Other | Admitting: Ophthalmology

## 2021-10-28 ENCOUNTER — Encounter (INDEPENDENT_AMBULATORY_CARE_PROVIDER_SITE_OTHER): Payer: Self-pay | Admitting: Ophthalmology

## 2021-10-28 ENCOUNTER — Encounter (INDEPENDENT_AMBULATORY_CARE_PROVIDER_SITE_OTHER): Payer: Self-pay

## 2021-10-28 DIAGNOSIS — E113512 Type 2 diabetes mellitus with proliferative diabetic retinopathy with macular edema, left eye: Secondary | ICD-10-CM

## 2021-10-28 DIAGNOSIS — H35372 Puckering of macula, left eye: Secondary | ICD-10-CM | POA: Diagnosis not present

## 2021-10-28 DIAGNOSIS — E113511 Type 2 diabetes mellitus with proliferative diabetic retinopathy with macular edema, right eye: Secondary | ICD-10-CM

## 2021-10-28 DIAGNOSIS — H34811 Central retinal vein occlusion, right eye, with macular edema: Secondary | ICD-10-CM

## 2021-10-28 NOTE — Assessment & Plan Note (Signed)
OS vastly improved post vitrectomy ILM peel and release of VMT.  Continue medications for only 2 more weeks

## 2021-10-28 NOTE — Assessment & Plan Note (Signed)
Not active at this time, diffuse atrophy

## 2021-10-28 NOTE — Patient Instructions (Signed)
Ofloxacin  4 times daily to the operative eye  Prednisolone acetate 1 drop to the operative eye 4 times daily  Patient instructed not to refill the medications and use them for maximum of 3 weeks.  Patient instructed do not rub the eye.  Patient has the option to use the patch at night.   No lifting and bending for 1 week. No water IN the eye for 10 days. Do not rub the eye. Wear shield at night for 1-3 days.  Continue your topical medications for a total of 3 weeks.  Do not refill your postoperative medications unless instructed.  Refrain from exercise or intentional activity which increases our heart rate above resting levels.  Normal walking to complete normal activities of your day are appropriate.  Driving:  Legally, you only need one good eye, of 20/40 or better to drive.  However, the practice does not recommend driving during first weeks after surgery, IF you are uncomfortable with your visual functioning or capabilities.   If you have known sleep apnea, wear your CPAP as you normally should.   Resume full activity in four more days

## 2021-10-28 NOTE — Progress Notes (Signed)
10/28/2021     CHIEF COMPLAINT Patient presents for  Chief Complaint  Patient presents with   Post-op Follow-up      HISTORY OF PRESENT ILLNESS: Ann Gomez is a 71 y.o. female who presents to the clinic today for:   HPI     Post-op Follow-up           Laterality: left eye   Discomfort: floaters.  Negative for pain, itching, foreign body sensation, tearing, discharge and none   Vision: is improved         Comments   1 week, dilate OS, post op, OCT, compliant on topical medications pt states her vision is a lot better         Last edited by Edmon Crape, MD on 10/28/2021  9:10 AM.      Referring physician: Sherral Hammers, FNP [phone] [address] [place],  [place] [address]  HISTORICAL INFORMATION:   Selected notes from the MEDICAL RECORD NUMBER    Lab Results  Component Value Date   HGBA1C 9.6 (H) 06/08/2009     CURRENT MEDICATIONS: Current Outpatient Medications (Ophthalmic Drugs)  Medication Sig   dorzolamide-timolol (COSOPT) 22.3-6.8 MG/ML ophthalmic solution 1 drop 2 (two) times daily.   latanoprost (XALATAN) 0.005 % ophthalmic solution 1 drop at bedtime.   prednisoLONE acetate (PRED FORTE) 1 % ophthalmic suspension Place 1 drop into the left eye 4 (four) times daily for 21 days.   No current facility-administered medications for this visit. (Ophthalmic Drugs)   Current Outpatient Medications (Other)  Medication Sig   amLODipine (NORVASC) 10 MG tablet Take 10 mg by mouth daily.   aspirin EC 81 MG tablet Take 1 tablet (81 mg total) by mouth daily.   Cholecalciferol (VITAMIN D3 PO) Take 1 capsule by mouth daily.   furosemide (LASIX) 40 MG tablet Take 1 tablet (40 mg total) by mouth daily. (Patient not taking: Reported on 08/16/2021)   Ginger, Zingiber officinalis, (GINGER ROOT PO) Take 550 mg by mouth daily.   glipiZIDE (GLUCOTROL) 10 MG tablet Take 10 mg by mouth in the morning and at bedtime.   insulin glargine (LANTUS) 100 UNIT/ML injection  Inject 30 Units into the skin every morning. 25 units in the pm (Patient not taking: Reported on 08/16/2021)   LEVEMIR FLEXTOUCH 100 UNIT/ML FlexTouch Pen Inject 17 Units into the skin 2 (two) times daily.   loperamide (IMODIUM) 2 MG capsule Take 1 capsule (2 mg total) by mouth 2 (two) times daily as needed for diarrhea or loose stools. (Patient not taking: Reported on 08/16/2021)   losartan (COZAAR) 100 MG tablet Take 100 mg by mouth daily.   metFORMIN (GLUCOPHAGE) 1000 MG tablet Take 1 tablet (1,000 mg total) by mouth 2 (two) times daily with a meal.   metoprolol tartrate (LOPRESSOR) 25 MG tablet Take 1 tablet (25 mg total) by mouth 2 (two) times daily.   ondansetron (ZOFRAN) 4 MG tablet Take 1 tablet (4 mg total) by mouth every 8 (eight) hours as needed for nausea or vomiting. (Patient not taking: Reported on 08/16/2021)   pravastatin (PRAVACHOL) 20 MG tablet Take 20 mg by mouth at bedtime. (Patient not taking: Reported on 08/16/2021)   No current facility-administered medications for this visit. (Other)      REVIEW OF SYSTEMS: ROS   Negative for: Constitutional, Gastrointestinal, Neurological, Skin, Genitourinary, Musculoskeletal, HENT, Endocrine, Cardiovascular, Eyes, Respiratory, Psychiatric, Allergic/Imm, Heme/Lymph Last edited by Erling Cruz D, CMA on 10/28/2021  8:20 AM.  ALLERGIES Allergies  Allergen Reactions   Hydrochlorothiazide Other (See Comments)    dizzy    PAST MEDICAL HISTORY Past Medical History:  Diagnosis Date   Diabetes mellitus    Edema    Essential hypertension, benign    Hypertension    Obesity, unspecified    Past Surgical History:  Procedure Laterality Date   CESAREAN SECTION      FAMILY HISTORY Family History  Family history unknown: Yes    SOCIAL HISTORY Social History   Tobacco Use   Smoking status: Never   Smokeless tobacco: Never  Substance Use Topics   Alcohol use: No   Drug use: No         OPHTHALMIC EXAM:  Base Eye  Exam     Visual Acuity (ETDRS)       Right Left   Dist [place] 20/80 20/25 +2         Tonometry (Tonopen, 8:26 AM)       Right Left   Pressure 18 13         Neuro/Psych     Oriented x3: Yes   Mood/Affect: Normal         Dilation     Left eye: 1.0% Mydriacyl, 2.5% Phenylephrine @ 8:27 AM           Slit Lamp and Fundus Exam     External Exam       Right Left   External Normal Normal         Slit Lamp Exam       Right Left   Lids/Lashes Normal Normal   Conjunctiva/Sclera White and quiet White and quiet   Cornea Clear Clear   Anterior Chamber Deep and quiet Deep and quiet   Iris Round and reactive Round and reactive   Lens Centered posterior chamber intraocular lens Centered posterior chamber intraocular lens   Anterior Vitreous Normal Normal         Fundus Exam       Right Left   Posterior Vitreous  Normal   Disc  No active N/V, Vitt Pap traction of is clinically visible   C/D Ratio  0.45   Macula  Microaneurysms, Mild clinically significant macular edema   Vessels  PDR-active, white lines of retinal nonperfusion nasally and inferiorly on clinical exam   Periphery  Dot-blot hemorrhage, moderate scatter laser            IMAGING AND PROCEDURES  Imaging and Procedures for 10/28/21  OCT, Retina - OU - Both Eyes       Right Eye Quality was good. Scan locations included subfoveal. Central Foveal Thickness: 226. Progression has worsened. Findings include abnormal foveal contour, cystoid macular edema, vitreomacular adhesion .   Left Eye Quality was good. Scan locations included subfoveal. Central Foveal Thickness: 293. Progression has been stable. Findings include abnormal foveal contour, vitreous traction, vitreomacular adhesion .   Notes OD resolution of recent CSME macular thickening in the right eye from February, now improved and maintained.  Diffuse atrophy present  1 week post vitrectomy, release of VMT and ILM peel for VMT trigger and  center involved CSME not responsive to therapy.  Vastly improved in 1 week.  Today             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 type 2 diabetes mellitus (HCC) OS vastly improved post vitrectomy ILM peel and release of VMT.  Continue medications for only 2 more weeks  Central retinal vein occlusion with macular edema of right eye Not active at this time, diffuse atrophy  Diabetic macular edema of right eye with proliferative retinopathy associated with type 2 diabetes mellitus (HCC) Recently quiescent     ICD-10-CM   1. 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 Central retinal vein occlusion with macular edema of right eye  H34.8110     3. Diabetic macular edema of right eye with proliferative retinopathy associated with type 2 diabetes mellitus (HCC)  E11.3511       1.  OS vastly improved acuity and anatomy, continue to monitor.  2.  Patient struck to complete medications over the next 2 weeks.  3.  Ophthalmic Meds Ordered this visit:  No orders of the defined types were placed in this encounter.      Return in about 4 weeks (around 11/25/2021) for OS, POST OP, DILATE OU, COLOR FP, OCT and evaluate OD.  Patient Instructions  Ofloxacin  4 times daily to the operative eye  Prednisolone acetate 1 drop to the operative eye 4 times daily  Patient instructed not to refill the medications and use them for maximum of 3 weeks.  Patient instructed do not rub the eye.  Patient has the option to use the patch at night.   No lifting and bending for 1 week. No water IN the eye for 10 days. Do not rub the eye. Wear shield at night for 1-3 days.  Continue your topical medications for a total of 3 weeks.  Do not refill your postoperative medications unless instructed.  Refrain from exercise or  intentional activity which increases our heart rate above resting levels.  Normal walking to complete normal activities of your day are appropriate.  Driving:  Legally, you only need one good eye, of 20/40 or better to drive.  However, the practice does not recommend driving during first weeks after surgery, IF you are uncomfortable with your visual functioning or capabilities.   If you have known sleep apnea, wear your CPAP as you normally should.   Resume full activity in four more days   Explained the diagnoses, plan, and follow up with the patient and they expressed understanding.  Patient expressed understanding of the importance of proper follow up care.   Alford Highland Emylia Latella M.D. Diseases & Surgery of the Retina and Vitreous [facility] 10/28/21     Abbreviations: M myopia (nearsighted); A astigmatism; H hyperopia (farsighted); P presbyopia; Mrx spectacle prescription;  CTL contact lenses; OD right eye; OS left eye; OU both eyes  XT exotropia; ET esotropia; PEK punctate epithelial keratitis; PEE punctate epithelial erosions; DES dry eye syndrome; MGD meibomian gland dysfunction; ATs artificial tears; PFAT's preservative free artificial tears; NSC nuclear sclerotic cataract; PSC posterior subcapsular cataract; ERM epi-retinal membrane; PVD posterior vitreous detachment; RD retinal detachment; DM diabetes mellitus; DR diabetic retinopathy; NPDR non-proliferative diabetic retinopathy; PDR proliferative diabetic retinopathy; CSME clinically significant macular edema; DME diabetic macular edema; dbh dot blot hemorrhages; CWS cotton wool spot; POAG primary open angle glaucoma; C/D cup-to-disc ratio; HVF humphrey visual field; GVF goldmann visual field; OCT optical coherence tomography; IOP intraocular pressure; BRVO Branch retinal vein occlusion; CRVO central retinal vein occlusion; CRAO central retinal artery occlusion; BRAO branch retinal artery occlusion; RT retinal tear; SB  scleral buckle; PPV pars plana vitrectomy; VH Vitreous hemorrhage; PRP panretinal laser photocoagulation;  IVK intravitreal kenalog; VMT vitreomacular traction; MH Macular hole;  NVD neovascularization of the disc; NVE neovascularization elsewhere; AREDS age related eye disease study; ARMD age related macular degeneration; POAG primary open angle glaucoma; EBMD epithelial/anterior basement membrane dystrophy; ACIOL anterior chamber intraocular lens; IOL intraocular lens; PCIOL posterior chamber intraocular lens; Phaco/IOL phacoemulsification with intraocular lens placement; [place] photorefractive keratectomy; LASIK laser assisted in situ keratomileusis; HTN hypertension; DM diabetes mellitus; COPD chronic obstructive pulmonary disease

## 2021-10-28 NOTE — Assessment & Plan Note (Signed)
Recently quiescent

## 2021-11-02 NOTE — Progress Notes (Deleted)
Cardiology Office Note:   Date:  11/02/2021  NAME:  Ann Gomez    MRN: [id number] DOB:  13-Nov-1950   PCP:  Sherral Hammers, FNP  Cardiologist:  None  Electrophysiologist:  None   Referring MD: Dot Been, FNP   No chief complaint on file. ***  History of Present Illness:   Ann Gomez is a 71 y.o. female with a hx of DM, HTN, HLD who is being seen today for the evaluation of abnormal EKG at the request of Sherral Hammers, FNP.  Past Medical History: Past Medical History:  Diagnosis Date   Diabetes mellitus    Edema    Essential hypertension, benign    Hypertension    Obesity, unspecified     Past Surgical History: Past Surgical History:  Procedure Laterality Date   CESAREAN SECTION      Current Medications: No outpatient medications have been marked as taking for the 11/04/21 encounter (Appointment) with O'Neal, Ronnald Ramp, MD.     Allergies:    Hydrochlorothiazide   Social History: Social History   Socioeconomic History   Marital status: Single    Spouse name: Not on file   Number of children: Not on file   Years of education: Not on file   Highest education level: Not on file  Occupational History   Not on file  Tobacco Use   Smoking status: Never   Smokeless tobacco: Never  Substance and Sexual Activity   Alcohol use: No   Drug use: No   Sexual activity: Not on file  Other Topics Concern   Not on file  Social History Narrative   Not on file   Social Determinants of Health   Financial Resource Strain: Not on file  Food Insecurity: Not on file  Transportation Needs: Not on file  Physical Activity: Not on file  Stress: Not on file  Social Connections: Not on file     Family History: The patient's ***Family history is unknown by patient.  ROS:   All other ROS reviewed and negative. Pertinent positives noted in the HPI.     EKGs/Labs/Other Studies Reviewed:   The following studies were personally reviewed by me  today:  EKG:  EKG is *** ordered today.  The ekg ordered today demonstrates ***, and was personally reviewed by me.   Recent Labs: 08/16/2021: BUN 15; Creatinine, Ser 1.26; Hemoglobin 13.0; Platelets 184; Potassium 4.3; Sodium 137   Recent Lipid Panel    Component Value Date/Time   CHOL 148 07/08/2010 2320   TRIG 54 07/08/2010 2320   HDL 76 07/08/2010 2320   CHOLHDL 1.9 Ratio 07/08/2010 2320   VLDL 11 07/08/2010 2320   LDLCALC 61 07/08/2010 2320    Physical Exam:   VS:  There were no vitals taken for this visit.   Wt Readings from Last 3 Encounters:  08/16/21 197 lb (89.4 kg)  07/12/15 236 lb (107 kg)    General: Well nourished, well developed, in no acute distress Head: Atraumatic, normal size  Eyes: PEERLA, EOMI  Neck: Supple, no JVD Endocrine: No thryomegaly Cardiac: Normal S1, S2; RRR; no murmurs, rubs, or gallops Lungs: Clear to auscultation bilaterally, no wheezing, rhonchi or rales  Abd: Soft, nontender, no hepatomegaly  Ext: No edema, pulses 2+ Musculoskeletal: No deformities, BUE and BLE strength normal and equal Skin: Warm and dry, no rashes   Neuro: Alert and oriented to person, place, time, and situation, CNII-XII grossly intact, no focal deficits  Psych:  Normal mood and affect   ASSESSMENT:   Ann Gomez is a 71 y.o. female who presents for the following: No diagnosis found.  PLAN:   There are no diagnoses linked to this encounter.  {Are you ordering a CV Procedure (e.g. stress test, cath, DCCV, TEE, etc)?   Press F2        :[id number]}  Disposition: No follow-ups on file.  Medication Adjustments/Labs and Tests Ordered: Current medicines are reviewed at length with the patient today.  Concerns regarding medicines are outlined above.  No orders of the defined types were placed in this encounter.  No orders of the defined types were placed in this encounter.   There are no Patient Instructions on file for this visit.   Time Spent with Patient: I  have spent a total of *** minutes with patient reviewing hospital notes, telemetry, EKGs, labs and examining the patient as well as establishing an assessment and plan that was discussed with the patient.  > 50% of time was spent in direct patient care.  Signed, Lenna Gilford. Flora Lipps, MD, [facility]  [facility]  [address], [address] [place], [place] [address] [phone]  11/02/2021 7:35 PM

## 2021-11-04 ENCOUNTER — Ambulatory Visit: Payer: Medicare Other | Admitting: Cardiovascular Disease

## 2021-11-04 DIAGNOSIS — I10 Essential (primary) hypertension: Secondary | ICD-10-CM

## 2021-11-04 DIAGNOSIS — R9431 Abnormal electrocardiogram [ECG] [EKG]: Secondary | ICD-10-CM

## 2021-11-18 ENCOUNTER — Encounter (HOSPITAL_COMMUNITY): Payer: Self-pay

## 2021-11-18 ENCOUNTER — Emergency Department (HOSPITAL_COMMUNITY): Payer: Medicare Other

## 2021-11-18 ENCOUNTER — Inpatient Hospital Stay (HOSPITAL_COMMUNITY)
Admission: EM | Admit: 2021-11-18 | Discharge: 2021-11-21 | DRG: 617 | Disposition: A | Discharge status: Home / Self Care | Payer: Medicare Other | Attending: Family Medicine | Admitting: Family Medicine

## 2021-11-18 ENCOUNTER — Ambulatory Visit (HOSPITAL_COMMUNITY)
Admission: EM | Admit: 2021-11-18 | Discharge: 2021-11-18 | Disposition: A | Discharge status: Home / Self Care | Payer: Medicare Other | Attending: Family Medicine | Admitting: Family Medicine

## 2021-11-18 ENCOUNTER — Other Ambulatory Visit: Payer: Self-pay

## 2021-11-18 DIAGNOSIS — L089 Local infection of the skin and subcutaneous tissue, unspecified: Secondary | ICD-10-CM | POA: Diagnosis present

## 2021-11-18 DIAGNOSIS — E1142 Type 2 diabetes mellitus with diabetic polyneuropathy: Secondary | ICD-10-CM | POA: Diagnosis present

## 2021-11-18 DIAGNOSIS — Z794 Long term (current) use of insulin: Secondary | ICD-10-CM

## 2021-11-18 DIAGNOSIS — Z7982 Long term (current) use of aspirin: Secondary | ICD-10-CM

## 2021-11-18 DIAGNOSIS — I10 Essential (primary) hypertension: Secondary | ICD-10-CM | POA: Diagnosis not present

## 2021-11-18 DIAGNOSIS — M795 Residual foreign body in soft tissue: Secondary | ICD-10-CM | POA: Diagnosis present

## 2021-11-18 DIAGNOSIS — E1165 Type 2 diabetes mellitus with hyperglycemia: Secondary | ICD-10-CM | POA: Diagnosis present

## 2021-11-18 DIAGNOSIS — Z7984 Long term (current) use of oral hypoglycemic drugs: Secondary | ICD-10-CM

## 2021-11-18 DIAGNOSIS — I129 Hypertensive chronic kidney disease with stage 1 through stage 4 chronic kidney disease, or unspecified chronic kidney disease: Secondary | ICD-10-CM | POA: Diagnosis present

## 2021-11-18 DIAGNOSIS — E871 Hypo-osmolality and hyponatremia: Secondary | ICD-10-CM | POA: Diagnosis not present

## 2021-11-18 DIAGNOSIS — E785 Hyperlipidemia, unspecified: Secondary | ICD-10-CM | POA: Diagnosis present

## 2021-11-18 DIAGNOSIS — L97519 Non-pressure chronic ulcer of other part of right foot with unspecified severity: Secondary | ICD-10-CM | POA: Diagnosis present

## 2021-11-18 DIAGNOSIS — M869 Osteomyelitis, unspecified: Secondary | ICD-10-CM | POA: Diagnosis present

## 2021-11-18 DIAGNOSIS — Z888 Allergy status to other drugs, medicaments and biological substances status: Secondary | ICD-10-CM

## 2021-11-18 DIAGNOSIS — E11621 Type 2 diabetes mellitus with foot ulcer: Secondary | ICD-10-CM | POA: Diagnosis present

## 2021-11-18 DIAGNOSIS — M86171 Other acute osteomyelitis, right ankle and foot: Secondary | ICD-10-CM | POA: Diagnosis present

## 2021-11-18 DIAGNOSIS — N1831 Chronic kidney disease, stage 3a: Secondary | ICD-10-CM | POA: Diagnosis present

## 2021-11-18 DIAGNOSIS — Z6831 Body mass index (BMI) 31.0-31.9, adult: Secondary | ICD-10-CM | POA: Diagnosis not present

## 2021-11-18 DIAGNOSIS — E669 Obesity, unspecified: Secondary | ICD-10-CM | POA: Diagnosis present

## 2021-11-18 DIAGNOSIS — E11628 Type 2 diabetes mellitus with other skin complications: Secondary | ICD-10-CM | POA: Diagnosis not present

## 2021-11-18 DIAGNOSIS — E1169 Type 2 diabetes mellitus with other specified complication: Secondary | ICD-10-CM | POA: Diagnosis present

## 2021-11-18 DIAGNOSIS — E6609 Other obesity due to excess calories: Secondary | ICD-10-CM | POA: Diagnosis not present

## 2021-11-18 DIAGNOSIS — Z79899 Other long term (current) drug therapy: Secondary | ICD-10-CM | POA: Diagnosis not present

## 2021-11-18 DIAGNOSIS — E1122 Type 2 diabetes mellitus with diabetic chronic kidney disease: Secondary | ICD-10-CM | POA: Diagnosis present

## 2021-11-18 LAB — CBC WITH DIFFERENTIAL/PLATELET
Abs Immature Granulocytes: 0.01 10*3/uL (ref 0.00–0.07)
Basophils Absolute: 0 10*3/uL (ref 0.0–0.1)
Basophils Relative: 1 %
Eosinophils Absolute: 0.1 10*3/uL (ref 0.0–0.5)
Eosinophils Relative: 1 %
HCT: 38.8 % (ref 36.0–46.0)
Hemoglobin: 12.7 g/dL (ref 12.0–15.0)
Immature Granulocytes: 0 %
Lymphocytes Relative: 27 %
Lymphs Abs: 1.3 10*3/uL (ref 0.7–4.0)
MCH: 27.5 pg (ref 26.0–34.0)
MCHC: 32.7 g/dL (ref 30.0–36.0)
MCV: 84.2 fL (ref 80.0–100.0)
Monocytes Absolute: 0.5 10*3/uL (ref 0.1–1.0)
Monocytes Relative: 11 %
Neutro Abs: 2.9 10*3/uL (ref 1.7–7.7)
Neutrophils Relative %: 60 %
Platelets: 222 10*3/uL (ref 150–400)
RBC: 4.61 MIL/uL (ref 3.87–5.11)
RDW: 14.2 % (ref 11.5–15.5)
WBC: 4.9 10*3/uL (ref 4.0–10.5)
nRBC: 0 % (ref 0.0–0.2)

## 2021-11-18 LAB — LACTIC ACID, PLASMA: Lactic Acid, Venous: 1.4 mmol/L (ref 0.5–1.9)

## 2021-11-18 LAB — COMPREHENSIVE METABOLIC PANEL
ALT: 11 U/L (ref 0–44)
AST: 16 U/L (ref 15–41)
Albumin: 3.5 g/dL (ref 3.5–5.0)
Alkaline Phosphatase: 74 U/L (ref 38–126)
Anion gap: 10 (ref 5–15)
BUN: 18 mg/dL (ref 8–23)
CO2: 24 mmol/L (ref 22–32)
Calcium: 9.7 mg/dL (ref 8.9–10.3)
Chloride: 102 mmol/L (ref 98–111)
Creatinine, Ser: 1.25 mg/dL — ABNORMAL HIGH (ref 0.44–1.00)
GFR, Estimated: 46 mL/min — ABNORMAL LOW (ref >60)
Glucose, Bld: 169 mg/dL — ABNORMAL HIGH (ref 70–99)
Potassium: 3.9 mmol/L (ref 3.5–5.1)
Sodium: 136 mmol/L (ref 135–145)
Total Bilirubin: 0.5 mg/dL (ref 0.3–1.2)
Total Protein: 8 g/dL (ref 6.5–8.1)

## 2021-11-18 MED ORDER — INSULIN ASPART 100 UNIT/ML IJ SOLN
0.0000 [IU] | Freq: Every day | INTRAMUSCULAR | Status: DC
  Administered 2021-11-20: 3 [IU] via SUBCUTANEOUS

## 2021-11-18 MED ORDER — OXYCODONE HCL 5 MG PO TABS: 5.0000 mg | ORAL_TABLET | Freq: Four times a day (QID) | ORAL | Status: DC | PRN

## 2021-11-18 MED ORDER — ENOXAPARIN SODIUM 40 MG/0.4ML IJ SOSY
40.0000 mg | PREFILLED_SYRINGE | INTRAMUSCULAR | Status: DC
Start: 2021-11-18 — End: 2021-11-21
  Administered 2021-11-19 – 2021-11-20 (×3): 40 mg via SUBCUTANEOUS
  Filled 2021-11-18 (×3): qty 0.4

## 2021-11-18 MED ORDER — MELATONIN 5 MG PO TABS: 5.0000 mg | ORAL_TABLET | Freq: Every evening | ORAL | Status: DC | PRN

## 2021-11-18 MED ORDER — SODIUM CHLORIDE 0.9 % IV SOLN
2.0000 g | INTRAVENOUS | Status: DC
  Administered 2021-11-18: 2 g via INTRAVENOUS
  Filled 2021-11-18: qty 20

## 2021-11-18 MED ORDER — HYDROMORPHONE HCL 1 MG/ML IJ SOLN: 0.5000 mg | INTRAMUSCULAR | Status: DC | PRN

## 2021-11-18 MED ORDER — PROCHLORPERAZINE EDISYLATE 10 MG/2ML IJ SOLN
10.0000 mg | Freq: Four times a day (QID) | INTRAMUSCULAR | Status: DC | PRN
Start: 2021-11-18 — End: 2021-11-21

## 2021-11-18 MED ORDER — POLYETHYLENE GLYCOL 3350 17 G PO PACK: 17.0000 g | PACK | Freq: Every day | ORAL | Status: DC | PRN

## 2021-11-18 MED ORDER — METOPROLOL TARTRATE 25 MG PO TABS
25.0000 mg | ORAL_TABLET | Freq: Two times a day (BID) | ORAL | Status: DC
  Administered 2021-11-19 – 2021-11-21 (×6): 25 mg via ORAL
  Filled 2021-11-18 (×6): qty 1

## 2021-11-18 MED ORDER — SODIUM CHLORIDE 0.9 % IV SOLN: INTRAVENOUS | Status: DC

## 2021-11-18 MED ORDER — METRONIDAZOLE 500 MG/100ML IV SOLN
500.0000 mg | Freq: Two times a day (BID) | INTRAVENOUS | Status: DC
  Administered 2021-11-18 – 2021-11-21 (×6): 500 mg via INTRAVENOUS
  Filled 2021-11-18 (×6): qty 100

## 2021-11-18 MED ORDER — ACETAMINOPHEN 325 MG PO TABS: 650.0000 mg | ORAL_TABLET | Freq: Four times a day (QID) | ORAL | Status: DC | PRN

## 2021-11-18 MED ORDER — INSULIN ASPART 100 UNIT/ML IJ SOLN
0.0000 [IU] | Freq: Three times a day (TID) | INTRAMUSCULAR | Status: DC
  Administered 2021-11-19 (×2): 2 [IU] via SUBCUTANEOUS
  Administered 2021-11-21: 3 [IU] via SUBCUTANEOUS

## 2021-11-18 MED ORDER — PRAVASTATIN SODIUM 10 MG PO TABS
20.0000 mg | ORAL_TABLET | Freq: Every day | ORAL | Status: DC
  Administered 2021-11-19 – 2021-11-20 (×3): 20 mg via ORAL
  Filled 2021-11-18: qty 1
  Filled 2021-11-18: qty 2
  Filled 2021-11-18: qty 1

## 2021-11-18 NOTE — ED Notes (Signed)
Dr. Hall at bedside.

## 2021-11-18 NOTE — ED Notes (Signed)
Floor RN ready to receive pt at this time - confirmation via secure chat

## 2021-11-18 NOTE — H&P (Addendum)
History and Physical  Ann Gomez YIA:[id number] DOB: [birth year] DOA: 11/18/2021  Referring physician: Jacklynn Lewis  PCP: Delford Field, FNP  Outpatient Specialists: Podiatry Patient coming from: Home  Chief Complaint: R foot wounds infection with foul smell.   HPI: Ann Gomez is a 71 y.o. female with medical history significant for uncontrolled type 2 diabetes, essential hypertension, hyperlipidemia, obesity, who presented to [facility] ED with complaints of right first toe wound infection associated with foul smell.  Patient was initially seen at urgent care and sent to the ED for further evaluation.  In the ED, right foot x-ray reveals large wound/ulcer of the distal first digits with evidence for osteomyelitis involving the first distal phalanx.  7 mm suspected linear foreign body within the soft tissues of the great toe.  Blood cultures were ordered and patient was started on empiric IV antibiotics Rocephin and IV Flagyl.  TRH, hospitalist service, was asked to admit.  ED Course: Tmax 98.7.  BP 182/95, pulse 77, respiration rate 16.  With oxygen saturation 100% on room air.  Review of Systems: Review of systems as noted in the HPI. All other systems reviewed and are negative.   Past Medical History:  Diagnosis Date   Diabetes mellitus    Edema    Essential hypertension, benign    Hypertension    Obesity, unspecified    Past Surgical History:  Procedure Laterality Date   CESAREAN SECTION      Social History:  reports that she has never smoked. She has never used smokeless tobacco. She reports current alcohol use. She reports that she does not use drugs.   Allergies  Allergen Reactions   Hydrochlorothiazide Other (See Comments)    dizzy    Family History  Family history unknown: Yes      Prior to Admission medications   Medication Sig Start Date End Date Taking? Authorizing Provider  amLODipine (NORVASC) 10 MG tablet Take 10 mg by mouth daily. 11/28/19    [provider]  aspirin EC 81 MG tablet Take 1 tablet (81 mg total) by mouth daily. 04/16/12   Ghimire, Henreitta Leber, MD  Cholecalciferol (VITAMIN D3 PO) Take 1 capsule by mouth daily.    [provider]  dorzolamide-timolol (COSOPT) 22.3-6.8 MG/ML ophthalmic solution 1 drop 2 (two) times daily. 11/28/19   [provider]  Ginger, Zingiber officinalis, (GINGER ROOT PO) Take 550 mg by mouth daily.    [provider]  glipiZIDE (GLUCOTROL) 10 MG tablet Take 10 mg by mouth in the morning and at bedtime.    [provider]  insulin glargine (LANTUS) 100 UNIT/ML injection Inject 30 Units into the skin every morning. 25 units in the pm    [provider]  latanoprost (XALATAN) 0.005 % ophthalmic solution 1 drop at bedtime. 12/17/19   [provider]  LEVEMIR FLEXTOUCH 100 UNIT/ML FlexTouch Pen Inject 17 Units into the skin 2 (two) times daily. 04/02/21   [provider]  losartan (COZAAR) 100 MG tablet Take 100 mg by mouth daily. 11/28/19   [provider]  metFORMIN (GLUCOPHAGE) 1000 MG tablet Take 1 tablet (1,000 mg total) by mouth 2 (two) times daily with a meal. 04/16/12   Ghimire, Henreitta Leber, MD  metoprolol tartrate (LOPRESSOR) 25 MG tablet Take 1 tablet (25 mg total) by mouth 2 (two) times daily. 08/16/21 09/15/21  Luna Fuse, MD  pravastatin (PRAVACHOL) 20 MG tablet Take 20 mg by mouth at bedtime. 11/28/19   [provider]  metoprolol succinate (TOPROL-XL) 100 MG 24 hr tablet Take 100 mg by mouth daily. Take with or immediately following a meal.  04/16/12  [provider]  rosuvastatin (CRESTOR) 5 MG tablet Take 5 mg by mouth daily.  04/16/12  [provider]    Physical Exam: BP (!) 184/85 (BP Location: Right Arm)   Pulse 77   Temp 98.4 F (36.9 C)   Resp 16   Ht 5' 3.5" (1.613 m)   Wt 89.4 kg   SpO2 100%   BMI 34.35 kg/m   General: 71 y.o. year-old female well developed well nourished  in no acute distress.  Alert and oriented x3. Cardiovascular: Regular rate and rhythm with no rubs or gallops.  No thyromegaly or JVD noted.  No lower extremity edema. 2/4 pulses in all 4 extremities. Respiratory: Clear to auscultation with no wheezes or rales. Good inspiratory effort. Abdomen: Soft nontender nondistended with normal bowel sounds x4 quadrants. Muskuloskeletal: No cyanosis, clubbing or edema noted bilaterally Neuro: CN II-XII intact, strength, sensation, reflexes Skin: R great toe wound at plantar region. Psychiatry: Judgement and insight appear normal. Mood is appropriate for condition and setting          Labs on Admission:  Basic Metabolic Panel: Recent Labs  Lab 11/18/21 1526  NA 136  K 3.9  CL 102  CO2 24  GLUCOSE 169*  BUN 18  CREATININE 1.25*  CALCIUM 9.7   Liver Function Tests: Recent Labs  Lab 11/18/21 1526  AST 16  ALT 11  ALKPHOS 74  BILITOT 0.5  PROT 8.0  ALBUMIN 3.5   No results for input(s): "LIPASE", "AMYLASE" in the last 168 hours. No results for input(s): "AMMONIA" in the last 168 hours. CBC: Recent Labs  Lab 11/18/21 1528  WBC 4.9  NEUTROABS 2.9  HGB 12.7  HCT 38.8  MCV 84.2  PLT 222   Cardiac Enzymes: No results for input(s): "CKTOTAL", "CKMB", "CKMBINDEX", "TROPONINI" in the last 168 hours.  BNP (last 3 results) No results for input(s): "BNP" in the last 8760 hours.  ProBNP (last 3 results) No results for input(s): "PROBNP" in the last 8760 hours.  CBG: No results for input(s): "GLUCAP" in the last 168 hours.  Radiological Exams on Admission: DG Foot Complete Right  Result Date: 11/18/2021 CLINICAL DATA:  Open wound great toe EXAM: RIGHT FOOT COMPLETE - 3+ VIEW COMPARISON:  None Available. FINDINGS: No fracture or malalignment. Large wound/ulcer at the distal first digit. No soft tissue emphysema. Fragmentation and destructive change involving the tuft of the distal phalanx first digit consistent with osteomyelitis. 7  mm linear density within the soft tissues, lateral side at the level of the mid proximal phalanx consistent with foreign body. Vascular calcifications. IMPRESSION: 1. Large wound/ulcer at the distal first digit with evidence for osteomyelitis involving the first distal phalanx. 2. 7 mm suspected linear foreign body within the soft tissues of the great toe as above. Electronically Signed   By: Donavan Foil M.D.   On: 11/18/2021 15:52    EKG: I independently viewed the EKG done and my findings are as followed: None available at the time of the dictation.  Assessment/Plan Present on Admission:  Toe osteomyelitis, right (HCC)  Principal Problem:   Toe osteomyelitis, right (HCC)  Right first digit osteomyelitis, seen on x-ray MRI right foot ordered by EDP, pending. Started on Rocephin and IV Flagyl in the ED Switch Rocephin to Cefepime daily Continue IV Flagyl BID Follow blood cultures  x2 peripherally Narrow down antibiotics when able. Gentle IV fluid hydration NS at 50 cc/h x 2 days. Analgesics as needed along with bowel regimen as needed. Has seen podiatry in the past, 1 year ago. Consult orthopedics surgery or podiatry in the morning. Obtain baseline CRP and ESR Monitor fever curve and WBC Obtain MRSA screening test, if positive add coverage for MRSA.  Type 2 diabetes with hyperglycemia Presented with serum glucose of 169 Obtain hemoglobin A1c Start insulin sliding scale  Possible polyneuropathy No pain in her feet, burning sensation at times Start Gabapentin 100 mg BID  CKD 3A Appears to be at her baseline creatinine Monitor urine output and electrolytes  Hypertension, BP is not at goal, elevated Resume home Lopressor 25 mg twice daily Closely monitor vital signs  Obesity BMI 34 Recommend weight loss outpatient with regular physical activity and healthy dieting.  Hyperlipidemia Resume home regimen.    DVT prophylaxis: Subcu Lovenox daily  Code Status: Full  code  Family Communication: 2 daughters at bedside  Disposition Plan: Admitted to telemetry surgical unit  Consults called: None.  Admission status: Inpatient status.   Status is: Inpatient The patient requires at least 2 midnights for further evaluation and treatment of present condition.   Kayleen Memos MD [facility] Pager [phone]  If 7PM-7AM, please contact night-coverage [email or web address] Password [facility]  11/18/2021, 10:42 PM

## 2021-11-18 NOTE — ED Notes (Signed)
Patient is being discharged from the Urgent Care and sent to the Emergency Department via personal vehicle . Per provider, patient is in need of higher level of care due to needed wound care. Patient is aware and verbalizes understanding of plan of care.  Vitals:   11/18/21 1402  BP: (!) 225/101  Pulse: 76  Temp: 98.2 F (36.8 C)  SpO2: 98%

## 2021-11-18 NOTE — ED Triage Notes (Signed)
Pt arrived POV from urgent care to the bottom of her right toe. Pt is a diabetic. Per urgent care they think it is infected and she will need IV antibiotics. Pt states she noticed the wound about 3 weeks ago.

## 2021-11-18 NOTE — ED Provider Notes (Signed)
[facility] EMERGENCY DEPARTMENT Provider Note   CSN: [id number] Arrival date & time: 11/18/21  1449     History  Chief Complaint  Patient presents with   Foot Pain    Ann Gomez is a 71 y.o. female.  Patient complains of a open sore on her right first toe.  Patient reports the sore has been there for several months but has gotten worse over the last few days.  Patient reports drainage and a foul odor.  Patient was seen at urgent care and sent to the emergency department for evaluation. Pt has seen [facility] in the past   The history is provided by the patient. No language interpreter was used.  Foot Pain This is a new problem.       Home Medications Prior to Admission medications   Medication Sig Start Date End Date Taking? Authorizing Provider  amLODipine (NORVASC) 10 MG tablet Take 10 mg by mouth daily. 11/28/19   [provider]  aspirin EC 81 MG tablet Take 1 tablet (81 mg total) by mouth daily. 04/16/12   Ghimire, Werner Lean, MD  Cholecalciferol (VITAMIN D3 PO) Take 1 capsule by mouth daily.    [provider]  dorzolamide-timolol (COSOPT) 22.3-6.8 MG/ML ophthalmic solution 1 drop 2 (two) times daily. 11/28/19   [provider]  Ginger, Zingiber officinalis, (GINGER ROOT PO) Take 550 mg by mouth daily.    [provider]  glipiZIDE (GLUCOTROL) 10 MG tablet Take 10 mg by mouth in the morning and at bedtime.    [provider]  insulin glargine (LANTUS) 100 UNIT/ML injection Inject 30 Units into the skin every morning. 25 units in the pm    [provider]  latanoprost (XALATAN) 0.005 % ophthalmic solution 1 drop at bedtime. 12/17/19   [provider]  LEVEMIR FLEXTOUCH 100 UNIT/ML FlexTouch Pen Inject 17 Units into the skin 2 (two) times daily. 04/02/21   [provider]  losartan (COZAAR) 100 MG tablet Take 100 mg by mouth daily. 11/28/19   [provider]  metFORMIN  (GLUCOPHAGE) 1000 MG tablet Take 1 tablet (1,000 mg total) by mouth 2 (two) times daily with a meal. 04/16/12   Ghimire, Werner Lean, MD  metoprolol tartrate (LOPRESSOR) 25 MG tablet Take 1 tablet (25 mg total) by mouth 2 (two) times daily. 08/16/21 09/15/21  Cheryll Cockayne, MD  pravastatin (PRAVACHOL) 20 MG tablet Take 20 mg by mouth at bedtime. 11/28/19   [provider]  metoprolol succinate (TOPROL-XL) 100 MG 24 hr tablet Take 100 mg by mouth daily. Take with or immediately following a meal.  04/16/12  [provider]  rosuvastatin (CRESTOR) 5 MG tablet Take 5 mg by mouth daily.  04/16/12  [provider]      Allergies    Hydrochlorothiazide    Review of Systems   Review of Systems  Musculoskeletal:  Positive for joint swelling.  All other systems reviewed and are negative.   Physical Exam Updated Vital Signs BP (!) 227/95 (BP Location: Right Arm)   Pulse 63   Temp 98.7 F (37.1 C) (Oral)   Resp 16   Ht 5' 3.5" (1.613 m)   Wt 89.4 kg   SpO2 100%   BMI 34.35 kg/m  Physical Exam Vitals and nursing note reviewed.  Constitutional:      Appearance: She is well-developed.  HENT:     Head: Normocephalic.  Cardiovascular:     Rate and Rhythm:  Normal rate.  Pulmonary:     Effort: Pulmonary effort is normal.  Abdominal:     General: There is no distension.  Musculoskeletal:        General: Swelling and tenderness present.     Cervical back: Normal range of motion.     Comments: Quarter size ulcer right 1st toe,  foul odor,   I unable to palpate pulse but Rn able to doppler  Skin:    General: Skin is warm.  Neurological:     General: No focal deficit present.     Mental Status: She is alert and oriented to person, place, and time.  Psychiatric:        Mood and Affect: Mood normal.     ED Results / Procedures / Treatments   Labs (all labs ordered are listed, but only abnormal results are displayed) Labs Reviewed  COMPREHENSIVE METABOLIC PANEL -  Abnormal; Notable for the following components:      Result Value   Glucose, Bld 169 (*)    Creatinine, Ser 1.25 (*)    GFR, Estimated 46 (*)    All other components within normal limits  CULTURE, BLOOD (ROUTINE X 2)  CULTURE, BLOOD (ROUTINE X 2)  LACTIC ACID, PLASMA  CBC WITH DIFFERENTIAL/PLATELET  CBC WITH DIFFERENTIAL/PLATELET  LACTIC ACID, PLASMA  CBG MONITORING, ED    EKG None  Radiology DG Foot Complete Right  Result Date: 11/18/2021 CLINICAL DATA:  Open wound great toe EXAM: RIGHT FOOT COMPLETE - 3+ VIEW COMPARISON:  None Available. FINDINGS: No fracture or malalignment. Large wound/ulcer at the distal first digit. No soft tissue emphysema. Fragmentation and destructive change involving the tuft of the distal phalanx first digit consistent with osteomyelitis. 7 mm linear density within the soft tissues, lateral side at the level of the mid proximal phalanx consistent with foreign body. Vascular calcifications. IMPRESSION: 1. Large wound/ulcer at the distal first digit with evidence for osteomyelitis involving the first distal phalanx. 2. 7 mm suspected linear foreign body within the soft tissues of the great toe as above. Electronically Signed   By: Jasmine Pang M.D.   On: 11/18/2021 15:52    Procedures Procedures    Medications Ordered in ED Medications - No data to display  ED Course/ Medical Decision Making/ A&P                           Medical Decision Making Patient complains of an ulcer on the toe of her right foot patient reports the wound has developed an odor and is draining.  Patient was seen at urgent care and sent to the emergency department for evaluation  Amount and/or Complexity of Data Reviewed Independent Historian:     Details: Patient is here with family who is supportive External Data Reviewed: notes.    Details: Urgent care notes reviewed Labs: ordered. Decision-making details documented in ED Course.    Details: Patient is a glucose elevated  169 creatinine is 1.25 Radiology: ordered and independent interpretation performed. Decision-making details documented in ED Course.    Details: X-ray shows a large wound ulcer distal left digit with evidence of osteomyelitis involving the first distal phalanx Discussion of management or test interpretation with external provider(s): Unassigned medicine contacted to see for admission    Risk Prescription drug management. Risk Details: ED wound infection orders, ceftriaxone and metronidazole IV ordered MRI for further evaluation is ordered  Final Clinical Impression(s) / ED Diagnoses Final diagnoses:  Osteomyelitis of right foot, unspecified type [facility])    Rx / DC Orders ED Discharge Orders     None         Osie Cheeks 11/18/21 2208    Benjiman Core, MD 11/18/21 [phone]

## 2021-11-18 NOTE — ED Provider Notes (Signed)
[facility]    CSN: [id number] Arrival date & time: 11/18/21  1341      History   Chief Complaint Chief Complaint  Patient presents with   Wound Check    HPI Ann Gomez is a 71 y.o. female.    Wound Check   Here for a sore on her right great toe.  It has been there "a while" but has been worse in the last 2 or 3 days.  She does not note a lot of pain, but states sometimes it hurts.  She does have diabetes and her sugars in the evenings often are in the 140s and in the mornings in the 70s.  If it is in the 70 she will take a little juice.  She is on insulin and 2 or 3 other medications for her diabetes.   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 associated with type 2 diabetes mellitus (HCC) 01/08/2020   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      Home Medications    Prior to Admission medications    Medication Sig Start Date End Date Taking? Authorizing Provider  amLODipine (NORVASC) 10 MG tablet Take 10 mg by mouth daily. 11/28/19  Yes [provider]  aspirin EC 81 MG tablet Take 1 tablet (81 mg total) by mouth daily. 04/16/12  Yes Ghimire, Werner Lean, MD  Cholecalciferol (VITAMIN D3 PO) Take 1 capsule by mouth daily.   Yes [provider]  dorzolamide-timolol (COSOPT) 22.3-6.8 MG/ML ophthalmic solution 1 drop 2 (two) times daily. 11/28/19  Yes [provider]  Ginger, Zingiber officinalis, (GINGER ROOT PO) Take 550 mg by mouth daily.   Yes [provider]  glipiZIDE (GLUCOTROL) 10 MG tablet Take 10 mg by mouth in the morning and at bedtime.   Yes [provider]  insulin glargine (LANTUS) 100 UNIT/ML injection Inject 30 Units into the skin every morning. 25 units in the pm   Yes [provider]  latanoprost (XALATAN) 0.005 % ophthalmic solution 1 drop at bedtime. 12/17/19  Yes [provider]  LEVEMIR FLEXTOUCH 100 UNIT/ML FlexTouch Pen Inject 17 Units into the skin 2 (two) times daily. 04/02/21  Yes [provider]  losartan (COZAAR) 100 MG tablet Take 100 mg by mouth daily. 11/28/19  Yes [provider]  metFORMIN (GLUCOPHAGE) 1000 MG tablet Take 1  tablet (1,000 mg total) by mouth 2 (two) times daily with a meal. 04/16/12  Yes Ghimire, Henreitta Leber, MD  pravastatin (PRAVACHOL) 20 MG tablet Take 20 mg by mouth at bedtime. 11/28/19  Yes [provider]  metoprolol tartrate (LOPRESSOR) 25 MG tablet Take 1 tablet (25 mg total) by mouth 2 (two) times daily. 08/16/21 09/15/21  Luna Fuse, MD  metoprolol succinate (TOPROL-XL) 100 MG 24 hr tablet Take 100 mg by mouth daily. Take with or immediately following a meal.  04/16/12  [provider]  rosuvastatin (CRESTOR) 5 MG tablet Take 5 mg by mouth daily.  04/16/12  [provider]    Family History Family History  Family history unknown: Yes     Social History Social History   Tobacco Use   Smoking status: Never   Smokeless tobacco: Never  Vaping Use   Vaping Use: Never used  Substance Use Topics   Alcohol use: Yes    Comment: occasional   Drug use: No     Allergies   Hydrochlorothiazide   Review of Systems Review of Systems   Physical Exam Triage Vital Signs ED Triage Vitals  Enc Vitals Group     BP 11/18/21 1402 (!) 225/101     Pulse Rate 11/18/21 1402 76     Resp --      Temp 11/18/21 1402 98.2 F (36.8 C)     Temp Source 11/18/21 1402 Oral     SpO2 11/18/21 1402 98 %     Weight --      Height --      Head Circumference --      Peak Flow --      Pain Score 11/18/21 1404 0     Pain Loc --      Pain Edu? --      Excl. in [place]? --    No data found.  Updated Vital Signs BP (!) 225/101 (BP Location: Left Arm)   Pulse 76   Temp 98.2 F (36.8 C) (Oral)   SpO2 98%   Visual Acuity Right Eye Distance:   Left Eye Distance:   Bilateral Distance:    Right Eye Near:   Left Eye Near:    Bilateral Near:     Physical Exam Constitutional:      General: She is not in acute distress.    Appearance: She is not ill-appearing, toxic-appearing or diaphoretic.  Cardiovascular:     Rate and Rhythm: Normal rate and regular rhythm.     Heart sounds: No murmur heard. Pulmonary:     Effort: Pulmonary effort is normal.     Breath sounds: Normal breath sounds.  Skin:    Coloration: Skin is not pale.     Comments: She has a very largeHer right forefoot is swollen and erythematous.  Thick opaque curved great toenail that is nearly horizontal in orientation.  On the surface of her right great toe there is an ulcer about 1.5 cm in diameter.  It is about 3 to 4 mm deep.  There is an aroma of Pseudomonas in the room.  There is also some induration and tightness of her lower right posterior leg.  It is nontender.  Pulse is 1+ dorsalis pedis  Neurological:     Mental Status: She is alert and oriented to person,  place, and time.  Psychiatric:        Behavior: Behavior normal.      UC Treatments / Results  Labs (all labs  ordered are listed, but only abnormal results are displayed) Labs Reviewed - No data to display  EKG   Radiology No results found.  Procedures Procedures (including critical care time)  Medications Ordered in UC Medications - No data to display  Initial Impression / Assessment and Plan / UC Course  I have reviewed the triage vital signs and the nursing notes.  Pertinent labs & imaging results that were available during my care of the patient were reviewed by me and considered in my medical decision making (see chart for details).     She is sent to the emergency room by private car for higher level of evaluation.  I am concerned that she may need more than oral antibiotics to treat this. Final Clinical Impressions(s) / UC Diagnoses   Final diagnoses:  Diabetic foot infection [facility])     Discharge Instructions      Please proceed to the emergency room for higher level of care and evaluation    ED Prescriptions   None    PDMP not reviewed this encounter.   Zenia Resides, MD 11/18/21 [phone]

## 2021-11-18 NOTE — ED Triage Notes (Signed)
Pt has a wound to the bottom of her right great toe. No injury. Pt is a diabetic.

## 2021-11-18 NOTE — Discharge Instructions (Addendum)
Please proceed to the emergency room for higher level of care and evaluation 

## 2021-11-18 NOTE — ED Provider Triage Note (Signed)
Emergency Medicine Provider Triage Evaluation Note  Ann Gomez , a 71 y.o. female  was evaluated in triage.  Pt complains of right foot wound for an unknown period of time.  Reports she is noted her nails to be crooked for several weeks however granddaughter staying with her noted a hole to the bottom of her right toe.  She denies any pain to the area however there is some lessened sensation.  Reports her blood sugars have been within normal limits at home.  Review of Systems  Positive: Wound Negative: Fever  Physical Exam  BP (!) 251/100   Pulse 78   Temp 98.3 F (36.8 C) (Oral)   Resp 18   Ht 5' 3.5" (1.613 m)   Wt 89.4 kg   SpO2 99%   BMI 34.35 kg/m  Gen:   Awake, no distress   Resp:  Normal effort  MSK:   Moves extremities without difficulty  Other:  Large foul odor from the right great toe.  Diminished pulses.  Medical Decision Making  Medically screening exam initiated at 3:12 PM.  Appropriate orders placed.  Ann Gomez was informed that the remainder of the evaluation will be completed by another provider, this initial triage assessment does not replace that evaluation, and the importance of remaining in the ED until their evaluation is complete.     Claude Manges, PA-C 11/18/21 1517

## 2021-11-18 NOTE — ED Notes (Signed)
Pt to MRI

## 2021-11-19 ENCOUNTER — Encounter (HOSPITAL_COMMUNITY): Payer: Self-pay | Admitting: Internal Medicine

## 2021-11-19 DIAGNOSIS — E785 Hyperlipidemia, unspecified: Secondary | ICD-10-CM | POA: Diagnosis not present

## 2021-11-19 DIAGNOSIS — E6609 Other obesity due to excess calories: Secondary | ICD-10-CM | POA: Diagnosis not present

## 2021-11-19 DIAGNOSIS — N1831 Chronic kidney disease, stage 3a: Secondary | ICD-10-CM

## 2021-11-19 DIAGNOSIS — M869 Osteomyelitis, unspecified: Secondary | ICD-10-CM | POA: Diagnosis not present

## 2021-11-19 DIAGNOSIS — E1142 Type 2 diabetes mellitus with diabetic polyneuropathy: Secondary | ICD-10-CM

## 2021-11-19 DIAGNOSIS — I10 Essential (primary) hypertension: Secondary | ICD-10-CM | POA: Diagnosis not present

## 2021-11-19 LAB — CBC WITH DIFFERENTIAL/PLATELET
Abs Immature Granulocytes: 0 10*3/uL (ref 0.00–0.07)
Basophils Absolute: 0 10*3/uL (ref 0.0–0.1)
Basophils Relative: 1 %
Eosinophils Absolute: 0.1 10*3/uL (ref 0.0–0.5)
Eosinophils Relative: 1 %
HCT: 35.9 % — ABNORMAL LOW (ref 36.0–46.0)
Hemoglobin: 11.9 g/dL — ABNORMAL LOW (ref 12.0–15.0)
Immature Granulocytes: 0 %
Lymphocytes Relative: 22 %
Lymphs Abs: 1 10*3/uL (ref 0.7–4.0)
MCH: 27.4 pg (ref 26.0–34.0)
MCHC: 33.1 g/dL (ref 30.0–36.0)
MCV: 82.7 fL (ref 80.0–100.0)
Monocytes Absolute: 0.5 10*3/uL (ref 0.1–1.0)
Monocytes Relative: 12 %
Neutro Abs: 2.9 10*3/uL (ref 1.7–7.7)
Neutrophils Relative %: 64 %
Platelets: 193 10*3/uL (ref 150–400)
RBC: 4.34 MIL/uL (ref 3.87–5.11)
RDW: 13.9 % (ref 11.5–15.5)
WBC: 4.5 10*3/uL (ref 4.0–10.5)
nRBC: 0 % (ref 0.0–0.2)

## 2021-11-19 LAB — GLUCOSE, CAPILLARY
Glucose-Capillary: 100 mg/dL — ABNORMAL HIGH (ref 70–99)
Glucose-Capillary: 114 mg/dL — ABNORMAL HIGH (ref 70–99)
Glucose-Capillary: 121 mg/dL — ABNORMAL HIGH (ref 70–99)
Glucose-Capillary: 127 mg/dL — ABNORMAL HIGH (ref 70–99)
Glucose-Capillary: 97 mg/dL (ref 70–99)

## 2021-11-19 LAB — COMPREHENSIVE METABOLIC PANEL
ALT: 10 U/L (ref 0–44)
AST: 14 U/L — ABNORMAL LOW (ref 15–41)
Albumin: 3.1 g/dL — ABNORMAL LOW (ref 3.5–5.0)
Alkaline Phosphatase: 68 U/L (ref 38–126)
Anion gap: 9 (ref 5–15)
BUN: 15 mg/dL (ref 8–23)
CO2: 24 mmol/L (ref 22–32)
Calcium: 9.5 mg/dL (ref 8.9–10.3)
Chloride: 104 mmol/L (ref 98–111)
Creatinine, Ser: 1.06 mg/dL — ABNORMAL HIGH (ref 0.44–1.00)
GFR, Estimated: 56 mL/min — ABNORMAL LOW (ref >60)
Glucose, Bld: 119 mg/dL — ABNORMAL HIGH (ref 70–99)
Potassium: 3.8 mmol/L (ref 3.5–5.1)
Sodium: 137 mmol/L (ref 135–145)
Total Bilirubin: 1 mg/dL (ref 0.3–1.2)
Total Protein: 7.3 g/dL (ref 6.5–8.1)

## 2021-11-19 LAB — PHOSPHORUS: Phosphorus: 3.1 mg/dL (ref 2.5–4.6)

## 2021-11-19 LAB — HEMOGLOBIN A1C
Hgb A1c MFr Bld: 7.8 % — ABNORMAL HIGH (ref 4.8–5.6)
Mean Plasma Glucose: 177.16 mg/dL

## 2021-11-19 LAB — MRSA NEXT GEN BY PCR, NASAL: MRSA by PCR Next Gen: NOT DETECTED

## 2021-11-19 LAB — LACTIC ACID, PLASMA: Lactic Acid, Venous: 0.9 mmol/L (ref 0.5–1.9)

## 2021-11-19 LAB — MAGNESIUM: Magnesium: 1.4 mg/dL — ABNORMAL LOW (ref 1.7–2.4)

## 2021-11-19 LAB — SEDIMENTATION RATE: Sed Rate: 39 mm/hr — ABNORMAL HIGH (ref 0–22)

## 2021-11-19 LAB — C-REACTIVE PROTEIN: CRP: 3.5 mg/dL — ABNORMAL HIGH (ref <1.0)

## 2021-11-19 MED ORDER — LOSARTAN POTASSIUM 50 MG PO TABS
100.0000 mg | ORAL_TABLET | Freq: Every day | ORAL | Status: DC
  Administered 2021-11-19 – 2021-11-21 (×3): 100 mg via ORAL
  Filled 2021-11-19 (×3): qty 2

## 2021-11-19 MED ORDER — ASPIRIN 81 MG PO TBEC
81.0000 mg | DELAYED_RELEASE_TABLET | Freq: Every day | ORAL | Status: DC
  Administered 2021-11-19 – 2021-11-21 (×3): 81 mg via ORAL
  Filled 2021-11-19 (×3): qty 1

## 2021-11-19 MED ORDER — HYDRALAZINE HCL 25 MG PO TABS
25.0000 mg | ORAL_TABLET | Freq: Once | ORAL | Status: AC
  Administered 2021-11-19: 25 mg via ORAL
  Filled 2021-11-19: qty 1

## 2021-11-19 MED ORDER — SODIUM CHLORIDE 0.9 % IV SOLN
2.0000 g | Freq: Two times a day (BID) | INTRAVENOUS | Status: DC
  Administered 2021-11-19 – 2021-11-21 (×6): 2 g via INTRAVENOUS
  Filled 2021-11-19 (×6): qty 12.5

## 2021-11-19 NOTE — Assessment & Plan Note (Signed)
A1c <8%, good control. - Continue SS corrections - Hold home metformin, Levemir, and glipizide

## 2021-11-19 NOTE — Hospital Course (Signed)
Ann Gomez is a 71 y.o. F with DM, HTN, obesity who presented with swollen and painful right great toe  In the ED, right foot x-ray showed distal osteo and foreign body.  Blood cultures were ordered and patient was started on empiric IV antibiotics Rocephin and IV Flagyl.    7/27: Admitted on antibiotics 7/28: Podiatry consulted 7/29: To the OR today

## 2021-11-19 NOTE — Assessment & Plan Note (Signed)
Cr baseline 1-1.2

## 2021-11-19 NOTE — Progress Notes (Signed)
Pharmacy Antibiotic Note  Ann Gomez is a 71 y.o. female admitted on 11/18/2021 with  osteomyelitis .  Pharmacy has been consulted for Cefepime dosing. WBC WNL. CrCl ~45.   Plan: Cefepime 2g IV q12h Flagyl per MD Trend WBC, temp, renal function  F/U infectious work-up  Height: 5' 5.5" (166.4 cm) Weight: 86.5 kg (190 lb 11.2 oz) (scale B) IBW/kg (Calculated) : 58.15  Temp (24hrs), Avg:98.3 F (36.8 C), Min:98.1 F (36.7 C), Max:98.7 F (37.1 C)  Recent Labs  Lab 11/18/21 1526 11/18/21 1528 11/19/21 0022  WBC  --  4.9 4.5  CREATININE 1.25*  --   --   LATICACIDVEN 1.4  --   --     Estimated Creatinine Clearance: 44.6 mL/min (A) (by C-G formula based on SCr of 1.25 mg/dL (H)).    Allergies  Allergen Reactions   Hydrochlorothiazide Other (See Comments)    dizzy    Abran Duke, PharmD, BCPS Clinical Pharmacist Phone: [phone]

## 2021-11-19 NOTE — Progress Notes (Signed)
Patient arrived to floor via stretcher with family at bedside. Visiting hours were discussed with family and patient. She is awake, alert, calm and cooperative. Her right great toe has an open wound under her foot. Wound was cleaned with NS and a non adhesive bandage was applied. Foot was wrapped in kerlex to keep it clean and dry. No drainage from wound was noted. Patient denies pain or discomfort. Patient will be NPO after medications given tonight.

## 2021-11-19 NOTE — Assessment & Plan Note (Signed)
Continues to have severe range hypertension - Continue losartan and metoprolol - Add as needed hydralazine

## 2021-11-19 NOTE — Consult Note (Signed)
Reason for Consult:right great toe infection Referring Physician: Dr Yvonne Kendall is an 71 y.o. female.  HPI: she has a history of type 2 diabetes, she developed a sore on the toe and has worsened  Past Medical History:  Diagnosis Date   Diabetes mellitus    Edema    Essential hypertension, benign    Hypertension    Obesity, unspecified     Past Surgical History:  Procedure Laterality Date   CESAREAN SECTION      Family History  Family history unknown: Yes    Social History:  reports that she has never smoked. She has never used smokeless tobacco. She reports current alcohol use. She reports that she does not use drugs.  Allergies:  Allergies  Allergen Reactions   Hydrochlorothiazide Other (See Comments)    Dizziness    Medications: I have reviewed the patient's current medications.  Results for orders placed or performed during the hospital encounter of 11/18/21 (from the past 48 hour(s))  Comprehensive metabolic panel     Status: Abnormal   Collection Time: 11/18/21  3:26 PM  Result Value Ref Range   Sodium 136 135 - 145 mmol/L   Potassium 3.9 3.5 - 5.1 mmol/L   Chloride 102 98 - 111 mmol/L   CO2 24 22 - 32 mmol/L   Glucose, Bld 169 (H) 70 - 99 mg/dL    Comment: Glucose reference range applies only to samples taken after fasting for at least 8 hours.   BUN 18 8 - 23 mg/dL   Creatinine, Ser 9.73 (H) 0.44 - 1.00 mg/dL   Calcium 9.7 8.9 - 53.2 mg/dL   Total Protein 8.0 6.5 - 8.1 g/dL   Albumin 3.5 3.5 - 5.0 g/dL   AST 16 15 - 41 U/L   ALT 11 0 - 44 U/L   Alkaline Phosphatase 74 38 - 126 U/L   Total Bilirubin 0.5 0.3 - 1.2 mg/dL   GFR, Estimated 46 (L) >60 mL/min    Comment: (NOTE) Calculated using the CKD-EPI Creatinine Equation (2021)    Anion gap 10 5 - 15    Comment: Performed at [facility], [address], [place], [place] [address]  Lactic acid, plasma     Status: None   Collection Time: 11/18/21  3:26 PM  Result Value Ref Range    Lactic Acid, Venous 1.4 0.5 - 1.9 mmol/L    Comment: Performed at [facility], [address], [place], [place] [address]  Blood culture (routine x 2)     Status: None (Preliminary result)   Collection Time: 11/18/21  3:27 PM   Specimen: BLOOD  Result Value Ref Range   Specimen Description BLOOD SITE NOT SPECIFIED    Special Requests      BOTTLES DRAWN AEROBIC AND ANAEROBIC Blood Culture adequate volume   Culture      NO GROWTH < 24 HOURS Performed at [facility], [address], [place], [place] [address]    Report Status PENDING   Blood culture (routine x 2)     Status: None (Preliminary result)   Collection Time: 11/18/21  3:27 PM   Specimen: BLOOD  Result Value Ref Range   Specimen Description BLOOD SITE NOT SPECIFIED    Special Requests      BOTTLES DRAWN AEROBIC AND ANAEROBIC Blood Culture adequate volume   Culture      NO GROWTH < 24 HOURS Performed at [facility],  [address], [place], [place] [address]    Report Status PENDING   CBC with Differential/Platelet     Status: None   Collection Time: 11/18/21  3:28 PM  Result Value Ref Range   WBC 4.9 4.0 - 10.5 K/uL   RBC 4.61 3.87 - 5.11 MIL/uL   Hemoglobin 12.7 12.0 - 15.0 g/dL   HCT 84.1 32.4 - 40.1 %   MCV 84.2 80.0 - 100.0 fL   MCH 27.5 26.0 - 34.0 pg   MCHC 32.7 30.0 - 36.0 g/dL   RDW 02.7 25.3 - 66.4 %   Platelets 222 150 - 400 K/uL   nRBC 0.0 0.0 - 0.2 %   Neutrophils Relative % 60 %   Neutro Abs 2.9 1.7 - 7.7 K/uL   Lymphocytes Relative 27 %   Lymphs Abs 1.3 0.7 - 4.0 K/uL   Monocytes Relative 11 %   Monocytes Absolute 0.5 0.1 - 1.0 K/uL   Eosinophils Relative 1 %   Eosinophils Absolute 0.1 0.0 - 0.5 K/uL   Basophils Relative 1 %   Basophils Absolute 0.0 0.0 - 0.1 K/uL   Immature Granulocytes 0 %   Abs Immature Granulocytes 0.01 0.00 - 0.07 K/uL    Comment: Performed at [facility], [address] [place], [place] [address]  MRSA Next Gen by PCR, Nasal     Status:  None   Collection Time: 11/19/21 12:07 AM   Specimen: Nasal Mucosa; Nasal Swab  Result Value Ref Range   MRSA by PCR Next Gen NOT DETECTED NOT DETECTED    Comment: (NOTE) The GeneXpert MRSA Assay (FDA approved for NASAL specimens only), is one component of a comprehensive MRSA colonization surveillance program. It is not intended to diagnose MRSA infection nor to guide or monitor treatment for MRSA infections. Test performance is not FDA approved in patients less than 63 years old. Performed at [facility], [address] [place], [place] [address]   Lactic acid, plasma     Status: None   Collection Time: 11/19/21 12:22 AM  Result Value Ref Range   Lactic Acid, Venous 0.9 0.5 - 1.9 mmol/L    Comment: Performed at [facility], [address], [place], [place] [address]  C-reactive protein     Status: Abnormal   Collection Time: 11/19/21 12:22 AM  Result Value Ref [place]   CRP 3.5 (H) <1.0 mg/dL    Comment: Performed at [facility], [address] [place], [place] [address]  Sedimentation rate     Status: Abnormal   Collection Time: 11/19/21 12:22 AM  Result Value Ref Range   Sed Rate 39 (H) 0 - 22 mm/hr    Comment: Performed at [facility], [address], [place], [place] [address]  Hemoglobin A1c     Status: Abnormal   Collection Time: 11/19/21 12:22 AM  Result Value Ref Range   Hgb A1c MFr Bld 7.8 (H) 4.8 - 5.6 %    Comment: (NOTE) Pre diabetes:          5.7%-6.4%  Diabetes:              >6.4%  Glycemic control for   <7.0% adults with diabetes    Mean Plasma Glucose 177.16 mg/dL    Comment: Performed at [facility], [address], [place], [place] [address]  CBC with Differential/Platelet     Status: Abnormal   Collection Time: 11/19/21 12:22 AM  Result Value Ref Range   WBC 4.5 4.0 - 10.5 K/uL   RBC 4.34 3.87 - 5.11 MIL/uL   Hemoglobin 11.9 (L) 12.0 - 15.0 g/dL   HCT 90.3 (L) 00.9 - 23.3 %   MCV 82.7 80.0 - 100.0 fL   MCH 27.4  26.0 - 34.0 pg   MCHC 33.1 30.0 - 36.0 g/dL   RDW 00.7 62.2 - 63.3 %   Platelets 193 150 - 400 K/uL   nRBC 0.0 0.0 - 0.2 %   Neutrophils Relative % 64 %   Neutro Abs 2.9 1.7 - 7.7 K/uL   Lymphocytes Relative 22 %   Lymphs Abs 1.0 0.7 - 4.0 K/uL   Monocytes Relative 12 %   Monocytes Absolute 0.5 0.1 - 1.0 K/uL   Eosinophils Relative 1 %   Eosinophils Absolute 0.1 0.0 - 0.5 K/uL   Basophils Relative 1 %   Basophils Absolute 0.0 0.0 - 0.1 K/uL   Immature Granulocytes 0 %   Abs Immature Granulocytes 0.00 0.00 - 0.07 K/uL    Comment: Performed at [facility], [address], [place], [place] [address]  Comprehensive metabolic panel     Status: Abnormal   Collection Time: 11/19/21 12:22 AM  Result Value Ref Range   Sodium 137 135 - 145 mmol/L   Potassium 3.8 3.5 - 5.1 mmol/L   Chloride 104 98 - 111 mmol/L   CO2 24 22 - 32 mmol/L   Glucose, Bld 119 (H) 70 - 99 mg/dL    Comment: Glucose reference range applies only to samples taken after fasting for at least 8 hours.   BUN 15 8 - 23 mg/dL   Creatinine, Ser 2.56 (H) 0.44 - 1.00 mg/dL   Calcium 9.5 8.9 - 38.9 mg/dL   Total Protein 7.3 6.5 - 8.1 g/dL   Albumin 3.1 (L) 3.5 - 5.0 g/dL   AST 14 (L) 15 - 41 U/L   ALT 10 0 - 44 U/L   Alkaline Phosphatase 68 38 - 126 U/L   Total Bilirubin 1.0 0.3 - 1.2 mg/dL   GFR, Estimated 56 (L) >60 mL/min    Comment: (NOTE) Calculated using the CKD-EPI Creatinine Equation (2021)    Anion gap 9 5 - 15    Comment: Performed at [facility], [address], [place], [place] [address]  Magnesium     Status: Abnormal   Collection Time: 11/19/21 12:22 AM  Result Value Ref Range   Magnesium 1.4 (L) 1.7 - 2.4 mg/dL    Comment: Performed at [facility], [address], [place], [place] [address]  Phosphorus     Status: None   Collection Time: 11/19/21 12:22 AM  Result Value Ref Range   Phosphorus 3.1 2.5 - 4.6 mg/dL    Comment: Performed at [facility], [address]  [place], [place] [address]  Glucose, capillary     Status: None   Collection Time: 11/19/21 12:49 AM  Result Value Ref Range   Glucose-Capillary 97 70 - 99 mg/dL    Comment: Glucose reference range applies only to samples taken after fasting for at least 8 hours.  Glucose, capillary     Status: Abnormal   Collection Time: 11/19/21  6:05 AM  Result Value Ref Range   Glucose-Capillary 127 (H) 70 - 99 mg/dL    Comment: Glucose reference range applies only to samples taken after fasting for at least 8 hours.   Comment 1 Notify RN  Comment 2 Document in Chart   Glucose, capillary     Status: Abnormal   Collection Time: 11/19/21 11:21 AM  Result Value Ref Range   Glucose-Capillary 114 (H) 70 - 99 mg/dL    Comment: Glucose reference range applies only to samples taken after fasting for at least 8 hours.   Comment 1 Repeat Test    Comment 2 Procedure Error   Glucose, capillary     Status: Abnormal   Collection Time: 11/19/21  4:05 PM  Result Value Ref Range   Glucose-Capillary 121 (H) 70 - 99 mg/dL    Comment: Glucose reference range applies only to samples taken after fasting for at least 8 hours.  Glucose, capillary     Status: Abnormal   Collection Time: 11/19/21  9:25 PM  Result Value Ref Range   Glucose-Capillary 100 (H) 70 - 99 mg/dL    Comment: Glucose reference range applies only to samples taken after fasting for at least 8 hours.    MR FOOT RIGHT WO CONTRAST  Result Date: 11/19/2021 CLINICAL DATA:  Great toe wound.  Evaluate for osteomyelitis. EXAM: MRI OF THE RIGHT FOREFOOT WITHOUT CONTRAST TECHNIQUE: Multiplanar, multisequence MR imaging of the right foot was performed. No intravenous contrast was administered. COMPARISON:  Foot x-ray 11/18/2021 FINDINGS: Patient motion degrades image quality limiting evaluation. Bones/Joint/Cartilage No acute fracture or dislocation. Susceptibility artifact around the first proximal phalanx consistent with a metallic foreign body seen on recent  x-ray likely reflecting a small needle fragment. Soft tissue wound of the tip of the first distal phalanx with bone destruction of the tuft of the first distal phalanx consistent with osteomyelitis. 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 Normal alignment. No joint effusion. Ligaments Collateral ligaments are intact.  Lisfranc ligament is intact. Muscles and Tendons Flexor, peroneal and extensor compartment tendons are intact. T2 hyperintense signal in the plantar musculature likely neurogenic. Soft tissue No fluid collection or hematoma. No soft tissue mass. Severe soft tissue edema around the ankle and dorsal aspect of the foot which may be reactive versus secondary to cellulitis. IMPRESSION: 1. Soft tissue wound of the tip of the first distal phalanx with bone destruction of the tuft of the first distal phalanx consistent with osteomyelitis. Electronically Signed   By: Elige Ko M.D.   On: 11/19/2021 06:22   DG Foot Complete Right  Result Date: 11/18/2021 CLINICAL DATA:  Open wound great toe EXAM: RIGHT FOOT COMPLETE - 3+ VIEW COMPARISON:  None Available. FINDINGS: No fracture or malalignment. Large wound/ulcer at the distal first digit. No soft tissue emphysema. Fragmentation and destructive change involving the tuft of the distal phalanx first digit consistent with osteomyelitis. 7 mm linear density within the soft tissues, lateral side at the level of the mid proximal phalanx consistent with foreign body. Vascular calcifications. IMPRESSION: 1. Large wound/ulcer at the distal first digit with evidence for osteomyelitis involving the first distal phalanx. 2. 7 mm suspected linear foreign body within the soft tissues of the great toe as above. Electronically Signed   By: Jasmine Pang M.D.   On: 11/18/2021 15:52    Review of Systems  Skin:        Ulcer right hallux  All other systems reviewed and are negative.  Blood pressure (!) 195/80, pulse 88, temperature 98.7 F  (37.1 C), temperature source Oral, resp. rate 18, height 5' 5.5" (1.664 m), weight 86.5 kg, SpO2 97 %.  Vitals:   11/19/21 1928 11/19/21 2134  BP: Marland Kitchen)  176/79 (!) 195/80  Pulse:  88  Resp:    Temp:    SpO2:      General AA&O x3. Normal mood and affect.  Vascular Palpable DP, foot warm and well perfused  Neurologic Epicritic sensation grossly absent.  Dermatologic (Wound) Wound Location: Rt. foot hallux Wound Measurement: 2cm Wound Base: Necrotic eschar Peri-wound: Reddened, Macerated Exudate: Scant/small amount Serous exudate  Orthopedic: Motor intact BLE.    Assessment/Plan:  Right hallux osteomyelitis -Imaging: Studies independently reviewed -Antibiotics: continue IV abx. Expect surgical cure of OM and 2 weeks PO -WB Status: WBAT to heel in post op shoe -Surgical Plan: MRI results reviewed with patient, discussed treatment of bone infection with abx vs resection. Recommend partial toe amputation. Risks/benefits discussed.  -NPO past midnight -OR tomorrow for partial hallux amp  Edwin Capdam R Jasan Doughtie 11/19/2021, 9:47 PM   Best available via secure chat for questions or concerns.

## 2021-11-19 NOTE — Assessment & Plan Note (Signed)
Continue pravastatin 

## 2021-11-19 NOTE — TOC Progression Note (Signed)
Transition of Care [facility]) - Progression Note    Patient Details  Name: Ann Gomez MRN: [id number] Date of Birth: 20-Dec-1950  Transition of Care [facility]) CM/SW Contact  Beckie Busing, RN Phone Number:[phone]  11/19/2021, 3:27 PM  Clinical Narrative:     Transition of Care (TOC) Screening Note   Patient Details  Name: Ann Gomez Date of Birth: 18-Sep-1950   Transition of Care [facility]) CM/SW Contact:    Beckie Busing, RN Phone Number: 11/19/2021, 3:28 PM    Transition of Care Department [facility]) has reviewed patient and no TOC needs have been identified at this time. We will continue to monitor patient advancement through interdisciplinary progression rounds.           Expected Discharge Plan and Services                                                 Social Determinants of Health (SDOH) Interventions    Readmission Risk Interventions     No data to display

## 2021-11-19 NOTE — Assessment & Plan Note (Signed)
Podiatry consulted, they plan to take to the OR 7/29 for foreign body removal, irrigation debridement, and partial toe amputation. - Continue cefepime and Flagyl - Consult Podiatry

## 2021-11-19 NOTE — Progress Notes (Signed)
  Progress Note   Patient: Ann Gomez EXH:[id number] DOB: 1951-02-24 DOA: 11/18/2021     1 DOS: the patient was seen and examined on 11/19/2021        Brief hospital course: Ann Gomez is a 71 y.o. F with DM, HTN, obesity who presented with swollen and painful right great toe  In the ED, right foot x-ray showed distal osteo and foreign body.  Blood cultures were ordered and patient was started on empiric IV antibiotics Rocephin and IV Flagyl.      Assessment and Plan: * Toe osteomyelitis, right (HCC) - Continue antibiotics - Consult Podiatry  Stage 3a chronic kidney disease (CKD) (HCC) Cr baseline 1-1.2  Essential hypertension BP elevated - Continue losartan and metoprolol  OBESITY NOS BMI 31  Hyperlipidemia - Continue pravastatin  Type 2 diabetes mellitus with polyneuropathy (HCC) A1c <8%, good control. - Continue SS corrections - Hold home metformin, Levemir, and glipizide          Subjective: Feeling well.  No fever, no confusion.     Physical Exam: Vitals:   11/19/21 0400 11/19/21 0800 11/19/21 1123 11/19/21 1608  BP: (!) 143/87 (!) 181/83 (!) 180/92 (!) 179/83  Pulse:  74    Resp: (!) 22 20 19 18   Temp:  98 F (36.7 C) 98 F (36.7 C) 98.7 F (37.1 C)  TempSrc:  Oral Oral Oral  SpO2: 98% 100% 100% 97%  Weight:      Height:       Adult female, lying in bed, no acute distress RRR, no murmurs, no peripheral edema Respiratory normal, lungs clear without rales or wheezes Abdomen soft no tenderness palpation or guarding, no ascites or distention The right great toe is swollen and red, painful to the touch Attention normal, affect normal, judgment insight appear normal     Disposition: Status is: Inpatient         Author: , MD 11/19/2021 4:16 PM  For on call review [email or web address].

## 2021-11-19 NOTE — Assessment & Plan Note (Signed)
BMI 31 °

## 2021-11-20 ENCOUNTER — Encounter (HOSPITAL_COMMUNITY): Payer: Self-pay | Admitting: Internal Medicine

## 2021-11-20 ENCOUNTER — Inpatient Hospital Stay (HOSPITAL_COMMUNITY): Payer: Medicare Other | Admitting: Certified Registered"

## 2021-11-20 ENCOUNTER — Encounter (HOSPITAL_COMMUNITY)
Admission: EM | Disposition: A | Discharge status: Home / Self Care | Payer: Self-pay | Source: Home / Self Care | Attending: Family Medicine

## 2021-11-20 DIAGNOSIS — E6609 Other obesity due to excess calories: Secondary | ICD-10-CM | POA: Diagnosis not present

## 2021-11-20 DIAGNOSIS — E785 Hyperlipidemia, unspecified: Secondary | ICD-10-CM | POA: Diagnosis not present

## 2021-11-20 DIAGNOSIS — M869 Osteomyelitis, unspecified: Secondary | ICD-10-CM

## 2021-11-20 DIAGNOSIS — Z7984 Long term (current) use of oral hypoglycemic drugs: Secondary | ICD-10-CM

## 2021-11-20 DIAGNOSIS — Z794 Long term (current) use of insulin: Secondary | ICD-10-CM

## 2021-11-20 DIAGNOSIS — I10 Essential (primary) hypertension: Secondary | ICD-10-CM | POA: Diagnosis not present

## 2021-11-20 DIAGNOSIS — E1169 Type 2 diabetes mellitus with other specified complication: Secondary | ICD-10-CM

## 2021-11-20 HISTORY — PX: AMPUTATION: SHX166

## 2021-11-20 LAB — COMPREHENSIVE METABOLIC PANEL
ALT: 10 U/L (ref 0–44)
AST: 14 U/L — ABNORMAL LOW (ref 15–41)
Albumin: 2.6 g/dL — ABNORMAL LOW (ref 3.5–5.0)
Alkaline Phosphatase: 53 U/L (ref 38–126)
Anion gap: 7 (ref 5–15)
BUN: 13 mg/dL (ref 8–23)
CO2: 25 mmol/L (ref 22–32)
Calcium: 8.9 mg/dL (ref 8.9–10.3)
Chloride: 105 mmol/L (ref 98–111)
Creatinine, Ser: 1.27 mg/dL — ABNORMAL HIGH (ref 0.44–1.00)
GFR, Estimated: 45 mL/min — ABNORMAL LOW (ref >60)
Glucose, Bld: 98 mg/dL (ref 70–99)
Potassium: 3.7 mmol/L (ref 3.5–5.1)
Sodium: 137 mmol/L (ref 135–145)
Total Bilirubin: 0.9 mg/dL (ref 0.3–1.2)
Total Protein: 6.2 g/dL — ABNORMAL LOW (ref 6.5–8.1)

## 2021-11-20 LAB — CBC
HCT: 31.8 % — ABNORMAL LOW (ref 36.0–46.0)
Hemoglobin: 10.4 g/dL — ABNORMAL LOW (ref 12.0–15.0)
MCH: 27.3 pg (ref 26.0–34.0)
MCHC: 32.7 g/dL (ref 30.0–36.0)
MCV: 83.5 fL (ref 80.0–100.0)
Platelets: 178 10*3/uL (ref 150–400)
RBC: 3.81 MIL/uL — ABNORMAL LOW (ref 3.87–5.11)
RDW: 14.2 % (ref 11.5–15.5)
WBC: 5.2 10*3/uL (ref 4.0–10.5)
nRBC: 0 % (ref 0.0–0.2)

## 2021-11-20 LAB — GLUCOSE, CAPILLARY
Glucose-Capillary: 106 mg/dL — ABNORMAL HIGH (ref 70–99)
Glucose-Capillary: 91 mg/dL (ref 70–99)
Glucose-Capillary: 97 mg/dL (ref 70–99)
Glucose-Capillary: 88 mg/dL (ref 70–99)
Glucose-Capillary: 251 mg/dL — ABNORMAL HIGH (ref 70–99)

## 2021-11-20 SURGERY — AMPUTATION DIGIT
Anesthesia: Monitor Anesthesia Care | Site: Toe | Laterality: Right

## 2021-11-20 MED ORDER — LACTATED RINGERS IV SOLN: INTRAVENOUS | Status: DC

## 2021-11-20 MED ORDER — DEXAMETHASONE SODIUM PHOSPHATE 10 MG/ML IJ SOLN
INTRAMUSCULAR | Status: AC
  Filled 2021-11-20: qty 1

## 2021-11-20 MED ORDER — FENTANYL CITRATE (PF) 250 MCG/5ML IJ SOLN
INTRAMUSCULAR | Status: AC
  Filled 2021-11-20: qty 5

## 2021-11-20 MED ORDER — AMISULPRIDE (ANTIEMETIC) 5 MG/2ML IV SOLN: 10.0000 mg | Freq: Once | INTRAVENOUS | Status: DC | PRN

## 2021-11-20 MED ORDER — CHLORHEXIDINE GLUCONATE 0.12 % MT SOLN
15.0000 mL | Freq: Once | OROMUCOSAL | Status: AC
  Administered 2021-11-20: 15 mL via OROMUCOSAL
  Filled 2021-11-20: qty 15

## 2021-11-20 MED ORDER — ONDANSETRON HCL 4 MG/2ML IJ SOLN: 4.0000 mg | Freq: Once | INTRAMUSCULAR | Status: DC | PRN

## 2021-11-20 MED ORDER — KETOROLAC TROMETHAMINE 15 MG/ML IJ SOLN: 15.0000 mg | Freq: Once | INTRAMUSCULAR | Status: DC | PRN

## 2021-11-20 MED ORDER — ACETAMINOPHEN 10 MG/ML IV SOLN: 1000.0000 mg | Freq: Once | INTRAVENOUS | Status: DC | PRN

## 2021-11-20 MED ORDER — HYDRALAZINE HCL 20 MG/ML IJ SOLN
10.0000 mg | Freq: Four times a day (QID) | INTRAMUSCULAR | Status: DC | PRN
Start: 2021-11-20 — End: 2021-11-21
  Administered 2021-11-20: 10 mg via INTRAVENOUS
  Filled 2021-11-20: qty 1

## 2021-11-20 MED ORDER — BUPIVACAINE HCL (PF) 0.25 % IJ SOLN
INTRAMUSCULAR | Status: DC | PRN
  Administered 2021-11-20: 10 mL

## 2021-11-20 MED ORDER — ORAL CARE MOUTH RINSE: 15.0000 mL | Freq: Once | OROMUCOSAL | Status: AC

## 2021-11-20 MED ORDER — LIDOCAINE 2% (20 MG/ML) 5 ML SYRINGE
INTRAMUSCULAR | Status: AC
  Filled 2021-11-20: qty 5

## 2021-11-20 MED ORDER — LIDOCAINE 2% (20 MG/ML) 5 ML SYRINGE
INTRAMUSCULAR | Status: DC | PRN
  Administered 2021-11-20: 60 mg via INTRAVENOUS

## 2021-11-20 MED ORDER — FENTANYL CITRATE (PF) 250 MCG/5ML IJ SOLN
INTRAMUSCULAR | Status: DC | PRN
  Administered 2021-11-20: 25 ug via INTRAVENOUS

## 2021-11-20 MED ORDER — PROPOFOL 500 MG/50ML IV EMUL
INTRAVENOUS | Status: DC | PRN
  Administered 2021-11-20: 75 ug/kg/min via INTRAVENOUS

## 2021-11-20 MED ORDER — ONDANSETRON HCL 4 MG/2ML IJ SOLN
INTRAMUSCULAR | Status: DC | PRN
  Administered 2021-11-20: 4 mg via INTRAVENOUS

## 2021-11-20 MED ORDER — ONDANSETRON HCL 4 MG/2ML IJ SOLN
INTRAMUSCULAR | Status: AC
  Filled 2021-11-20: qty 2

## 2021-11-20 MED ORDER — DEXAMETHASONE SODIUM PHOSPHATE 10 MG/ML IJ SOLN
INTRAMUSCULAR | Status: DC | PRN
  Administered 2021-11-20: 4 mg via INTRAVENOUS

## 2021-11-20 MED ORDER — FENTANYL CITRATE (PF) 100 MCG/2ML IJ SOLN: 25.0000 ug | INTRAMUSCULAR | Status: DC | PRN

## 2021-11-20 MED ORDER — PROPOFOL 10 MG/ML IV BOLUS
INTRAVENOUS | Status: DC | PRN
  Administered 2021-11-20: 30 mg via INTRAVENOUS

## 2021-11-20 MED ORDER — INSULIN ASPART 100 UNIT/ML IJ SOLN: 0.0000 [IU] | INTRAMUSCULAR | Status: DC | PRN

## 2021-11-20 MED ORDER — BUPIVACAINE HCL (PF) 0.25 % IJ SOLN
INTRAMUSCULAR | Status: AC
  Filled 2021-11-20: qty 30

## 2021-11-20 MED ORDER — 0.9 % SODIUM CHLORIDE (POUR BTL) OPTIME
TOPICAL | Status: DC | PRN
  Administered 2021-11-20: 1000 mL

## 2021-11-20 SURGICAL SUPPLY — 37 items
BLADE LONG MED 31X9 (MISCELLANEOUS) IMPLANT
BNDG ELASTIC 4X5.8 VLCR STR LF (GAUZE/BANDAGES/DRESSINGS) ×1 IMPLANT
BNDG STRETCH 4X75 STRL LF (GAUZE/BANDAGES/DRESSINGS) ×1 IMPLANT
COVER SURGICAL LIGHT HANDLE (MISCELLANEOUS) ×2 IMPLANT
CUFF TOURN SGL QUICK 24 (TOURNIQUET CUFF)
CUFF TRNQT CYL 24X4X16.5-23 (TOURNIQUET CUFF) IMPLANT
DRAPE SURG 17X23 STRL (DRAPES) ×2 IMPLANT
DRSG XEROFORM 1X8 (GAUZE/BANDAGES/DRESSINGS) ×1 IMPLANT
GAUZE SPONGE 2X2 8PLY STRL LF (GAUZE/BANDAGES/DRESSINGS) IMPLANT
GAUZE SPONGE 4X4 12PLY STRL (GAUZE/BANDAGES/DRESSINGS) IMPLANT
GAUZE SPONGE 4X4 12PLY STRL LF (GAUZE/BANDAGES/DRESSINGS) ×1 IMPLANT
GAUZE XEROFORM 1X8 LF (GAUZE/BANDAGES/DRESSINGS) IMPLANT
GLOVE BIOGEL M 7.0 STRL (GLOVE) ×2 IMPLANT
GLOVE BIOGEL PI ORTHO PRO 7.5 (GLOVE) ×1
GLOVE PI ORTHO PRO STRL 7.5 (GLOVE) ×1 IMPLANT
GOWN STRL REUS W/ TWL LRG LVL3 (GOWN DISPOSABLE) ×2 IMPLANT
GOWN STRL REUS W/TWL LRG LVL3 (GOWN DISPOSABLE) ×4
KIT BASIN OR (CUSTOM PROCEDURE TRAY) ×2 IMPLANT
KIT TURNOVER KIT B (KITS) ×2 IMPLANT
NDL HYPO 25GX1X1/2 BEV (NEEDLE) IMPLANT
NEEDLE HYPO 25GX1X1/2 BEV (NEEDLE) ×2 IMPLANT
NS IRRIG 1000ML POUR BTL (IV SOLUTION) ×2 IMPLANT
PACK ORTHO EXTREMITY (CUSTOM PROCEDURE TRAY) ×2 IMPLANT
PAD ARMBOARD 7.5X6 YLW CONV (MISCELLANEOUS) ×4 IMPLANT
SOL PREP POV-IOD 4OZ 10% (MISCELLANEOUS) ×4 IMPLANT
SPECIMEN JAR SMALL (MISCELLANEOUS) ×2 IMPLANT
SPONGE GAUZE 2X2 STER 10/PKG (GAUZE/BANDAGES/DRESSINGS)
STAPLER VISISTAT 35W (STAPLE) ×1 IMPLANT
SUT ETHILON 3 0 PS 1 (SUTURE) ×1 IMPLANT
SUT ETHILON 4 0 PS 2 18 (SUTURE) ×1 IMPLANT
SUT MNCRL AB 4-0 PS2 18 (SUTURE) ×1 IMPLANT
SYR CONTROL 10ML LL (SYRINGE) ×1 IMPLANT
TOWEL GREEN STERILE (TOWEL DISPOSABLE) ×2 IMPLANT
TOWEL GREEN STERILE FF (TOWEL DISPOSABLE) ×2 IMPLANT
TUBE CONNECTING 12X1/4 (SUCTIONS) ×1 IMPLANT
UNDERPAD 30X36 HEAVY ABSORB (UNDERPADS AND DIAPERS) ×2 IMPLANT
WATER STERILE IRR 1000ML POUR (IV SOLUTION) ×2 IMPLANT

## 2021-11-20 NOTE — Progress Notes (Signed)
History and Physical Interval Note:  11/20/2021 1:56 PM  Ann Gomez  has presented today for surgery, with the diagnosis of osteomyelitis great toe right.  The various methods of treatment have been discussed with the patient and family. After consideration of risks, benefits and other options for treatment, the patient has consented to   Procedure(s): AMPUTATION GREAT TOE (Right) as a surgical intervention.  The patient's history has been reviewed, patient examined, no change in status, stable for surgery.  I have reviewed the patient's chart and labs.  Questions were answered to the patient's satisfaction.     Edwin Cap

## 2021-11-20 NOTE — Anesthesia Preprocedure Evaluation (Signed)
Anesthesia Evaluation  Patient identified by MRN, date of birth, ID band Patient awake    Reviewed: Allergy & Precautions, NPO status , Patient's Chart, lab work & pertinent test results  Airway Mallampati: II  TM Distance: >3 FB Neck ROM: Full    Dental  (+) Missing   Pulmonary neg pulmonary ROS,    Pulmonary exam normal        Cardiovascular hypertension, Pt. on home beta blockers Normal cardiovascular exam     Neuro/Psych negative neurological ROS     GI/Hepatic negative GI ROS, Neg liver ROS,   Endo/Other  diabetes, Oral Hypoglycemic Agents, Insulin Dependent  Renal/GU Renal disease     Musculoskeletal negative musculoskeletal ROS (+)   Abdominal (+) + obese,   Peds  Hematology  (+) Blood dyscrasia, anemia ,   Anesthesia Other Findings RIGHT GREAT TOE INFECTION  Reproductive/Obstetrics                             Anesthesia Physical Anesthesia Plan  ASA: 3  Anesthesia Plan: MAC   Post-op Pain Management:    Induction: Intravenous  PONV Risk Score and Plan: 2 and Ondansetron, Dexamethasone, Propofol infusion and Treatment may vary due to age or medical condition  Airway Management Planned: Simple Face Mask  Additional Equipment:   Intra-op Plan:   Post-operative Plan:   Informed Consent: I have reviewed the patients History and Physical, chart, labs and discussed the procedure including the risks, benefits and alternatives for the proposed anesthesia with the patient or authorized representative who has indicated his/her understanding and acceptance.     Dental advisory given  Plan Discussed with: CRNA  Anesthesia Plan Comments:         Anesthesia Quick Evaluation

## 2021-11-20 NOTE — Transfer of Care (Signed)
Immediate Anesthesia Transfer of Care Note  Patient: Ann Gomez  Procedure(s) Performed: AMPUTATION GREAT TOE (Right: Toe)  Patient Location: PACU  Anesthesia Type:MAC  Level of Consciousness: awake, alert  and oriented  Airway & Oxygen Therapy: Patient Spontanous Breathing  Post-op Assessment: Report given to RN and Post -op Vital signs reviewed and stable  Post vital signs: Reviewed and stable  Last Vitals:  Vitals Value Taken Time  BP 140/65 11/20/21 1530  Temp    Pulse 57 11/20/21 1533  Resp 17 11/20/21 1533  SpO2 100 % 11/20/21 1533  Vitals shown include unvalidated device data.  Last Pain:  Vitals:   11/20/21 1400  TempSrc: Oral  PainSc:          Complications: No notable events documented.

## 2021-11-20 NOTE — Brief Op Note (Signed)
11/20/2021  3:26 PM  PATIENT:  Ann Gomez  71 y.o. female  PRE-OPERATIVE DIAGNOSIS:  RIGHT GREAT TOE INFECTION  POST-OPERATIVE DIAGNOSIS:  OSTEOMYELITIS RIGHT GREAT TOE  PROCEDURE:  Procedure(s): AMPUTATION GREAT TOE (Right)  SURGEON:  Surgeon(s) and Role:    * Zasha Belleau, Stephan Minister, DPM - Primary  PHYSICIAN ASSISTANT:   ASSISTANTS: none   ANESTHESIA:   local and MAC  EBL:  5 mL   BLOOD ADMINISTERED:none  DRAINS: none   LOCAL MEDICATIONS USED:  MARCAINE    and Amount: 10 ml  SPECIMEN:  right hallux  DISPOSITION OF SPECIMEN:  PATHOLOGY  COUNTS:  YES  TOURNIQUET:   Total Tourniquet Time Documented: Calf (Right) - 15 minutes Total: Calf (Right) - 15 minutes   DICTATION: .Note written in EPIC  PLAN OF CARE: Admit to inpatient   PATIENT DISPOSITION:  PACU - hemodynamically stable.   Delay start of Pharmacological VTE agent (>24hrs) due to surgical blood loss or risk of bleeding: no

## 2021-11-20 NOTE — Progress Notes (Signed)
  Progress Note   Patient: Ann Gomez VQQ:[id number] DOB: [birth year] DOA: 11/18/2021     2 DOS: the patient was seen and examined on 11/20/2021       Brief hospital course: [name] is a 71 y.o. F with DM, HTN, obesity who presented with swollen and painful right great toe  In the ED, right foot x-ray showed distal osteo and foreign body.  Blood cultures were ordered and patient was started on empiric IV antibiotics Rocephin and IV Flagyl.    7/27: Admitted on antibiotics 7/28: Podiatry consulted 7/29: To the OR today     Assessment and Plan: * Toe osteomyelitis, right [facility]) Podiatry consulted, they plan to take to the OR 7/29 for foreign body removal, irrigation debridement, and partial toe amputation. - Continue cefepime and Flagyl - Consult Podiatry  Stage 3a chronic kidney disease (CKD) (HCC) Cr baseline 1-1.2  Essential hypertension Continues to have severe range hypertension - Continue losartan and metoprolol - Add as needed hydralazine  OBESITY NOS BMI 31  Hyperlipidemia - Continue pravastatin  Type 2 diabetes mellitus with polyneuropathy (HCC) A1c <8% Blood glucose normal - Continue SS corrections - Hold home metformin, Levemir, and glipizide          Subjective: No headache, chest pain, dyspnea, respiratory symptoms, fever, confusion     Physical Exam: Vitals:   11/20/21 0743 11/20/21 1102 11/20/21 1400 11/20/21 1403  BP: (!) 157/98 (!) 186/109  (!) 162/76  Pulse: 64 65  68  Resp: 16 16  18   Temp: 98 F (36.7 C) 98.1 F (36.7 C) (!) 97.4 F (36.3 C)   TempSrc: Oral Oral Oral   SpO2: 99% 97%  98%  Weight:    86.5 kg  Height:    5' 5.51" (1.664 m)   Adult female, lying in bed, no acute distress RRR, no murmurs, no peripheral edema Respiratory rate normal, lungs clear without rales or wheezes Abdomen soft, no tenderness palpation, no ascites or distention Face symmetric, speech fluent   Data Reviewed: Hemogram shows  hemoglobin 10, no change, white blood cells and platelets normal Glucose normal Patient metabolic panel shows normal creatinine potassium and sodium  Family Communication: Daughters at the bedside    Disposition: Status is: Inpatient         Author: [id number], MD 11/20/2021 3:11 PM  For on call review [email or web address].

## 2021-11-20 NOTE — Progress Notes (Signed)
Orthopedic Tech Progress Note Patient Details:  Ann Gomez Jun 16, 1950 [id number]  Ortho Devices Type of Ortho Device: Postop shoe/boot Ortho Device/Splint Location: Right foot Ortho Device/Splint Interventions: Application   Post Interventions Patient Tolerated: Well  [name] [name] 11/20/2021, 4:52 PM

## 2021-11-20 NOTE — Anesthesia Postprocedure Evaluation (Signed)
Anesthesia Post Note  Patient: Ann Gomez  Procedure(s) Performed: AMPUTATION GREAT TOE (Right: Toe)     Patient location during evaluation: PACU Anesthesia Type: MAC Level of consciousness: awake Pain management: pain level controlled Vital Signs Assessment: post-procedure vital signs reviewed and stable Respiratory status: spontaneous breathing, nonlabored ventilation, respiratory function stable and patient connected to nasal cannula oxygen Cardiovascular status: stable and blood pressure returned to baseline Postop Assessment: no apparent nausea or vomiting Anesthetic complications: no   No notable events documented.  Last Vitals:  Vitals:   11/20/21 1632 11/20/21 2017  BP: (!) 154/91 (!) 173/89  Pulse: 73 79  Resp: 15 16  Temp: 36.4 C 36.8 C  SpO2: 100% 100%    Last Pain:  Vitals:   11/20/21 2017  TempSrc: Oral  PainSc:                  Karyl Kinnier Lindora Alviar

## 2021-11-20 NOTE — Progress Notes (Signed)
Pac[facility] RN Report to floor given  Gave report to  [employer]. Room: 6N05   Discussed surgery, meds given in OR and [facility], VS, IV fluids given, EBL, urine output, pain and other pertinent information. Also discussed if pt had any family or friends here or belongings with them.   No pain, pt on RA, VSS. +CSM to R foot toes, unable to assess pulse due to bulky dsg. Pt had sips of water. All belongings were brought from her room on 2C to 6N05 by [facility] staff. No belongings came to Pacu.   Pt exits my care.

## 2021-11-20 NOTE — Anesthesia Procedure Notes (Signed)
Procedure Name: MAC Date/Time: 11/20/2021 2:50 PM  Performed by: Trinna Post., CRNAPre-anesthesia Checklist: Patient identified, Emergency Drugs available, Suction available, Patient being monitored and Timeout performed Patient Re-evaluated:Patient Re-evaluated prior to induction Oxygen Delivery Method: Simple face mask Preoxygenation: Pre-oxygenation with 100% oxygen Induction Type: IV induction Placement Confirmation: positive ETCO2

## 2021-11-20 NOTE — Op Note (Signed)
Patient Name: Ann Gomez DOB: 1950-05-20  MRN: [id number]   Date of Service: 11/18/2021 - 11/20/2021  Surgeon: Dr. Lanae Crumbly, DPM Assistants: None Pre-operative Diagnosis:  Osteomyelitis of right great toe Post-operative Diagnosis:   Osteomyelitis of right great toe Procedures:  1) partial toe amputation right great toe Pathology/Specimens: ID Type Source Tests Collected by Time Destination  1 : RIGHT GREAT TOE Tissue Soft Tissue, Other SURGICAL PATHOLOGY Criselda Peaches, DPM 11/20/2021 1410    Anesthesia: MAC with local Hemostasis:  Total Tourniquet Time Documented: Calf (Right) - 15 minutes Total: Calf (Right) - 15 minutes  Estimated Blood Loss: 5 mL Materials: * No implants in log * Medications: 10cc 0.25% marcaine plain Complications: none  Indications for Procedure:  This is a 71 y.o. female with a history of type 2 diabetes that developed an ulcer and osteomyelitis of the right great toe   Procedure in Detail: Patient was identified in pre-operative holding area. Formal consent was signed and the right lower extremity was marked. Patient was brought back to the operating room. Anesthesia was induced. The extremity was prepped and draped in the usual sterile fashion. Timeout was taken to confirm patient name, laterality, and procedure prior to incision.   Attention was then directed to the great toe where an incision was made over the IPJ in a fishmouth incision. Dissection was carried down to level of bone.  Dissection was continued to the interphalangeal joint and all collateral ligaments were freed at the joint.  The bone soft tissue attachments of the distal phalanx were removed and passed for pathology.  The remaining proximal phalanx head appeared healthy and viable.  Resection of the head was necessary for skin approximation. The area was copiously irrigated.  The skin was reapproximated with monocryl, nylon and skin staples.  The foot was then dressed with  xeroform and DSD. Patient tolerated the procedure well.   Disposition: Following a period of post-operative monitoring, patient will be transferred to the floor.

## 2021-11-21 ENCOUNTER — Encounter (HOSPITAL_COMMUNITY): Payer: Self-pay | Admitting: Podiatry

## 2021-11-21 ENCOUNTER — Inpatient Hospital Stay (HOSPITAL_COMMUNITY): Payer: Medicare Other

## 2021-11-21 DIAGNOSIS — I10 Essential (primary) hypertension: Secondary | ICD-10-CM | POA: Diagnosis not present

## 2021-11-21 DIAGNOSIS — E871 Hypo-osmolality and hyponatremia: Secondary | ICD-10-CM

## 2021-11-21 DIAGNOSIS — E785 Hyperlipidemia, unspecified: Secondary | ICD-10-CM | POA: Diagnosis not present

## 2021-11-21 DIAGNOSIS — M869 Osteomyelitis, unspecified: Secondary | ICD-10-CM | POA: Diagnosis not present

## 2021-11-21 LAB — GLUCOSE, CAPILLARY
Glucose-Capillary: 195 mg/dL — ABNORMAL HIGH (ref 70–99)
Glucose-Capillary: 82 mg/dL (ref 70–99)

## 2021-11-21 LAB — BASIC METABOLIC PANEL WITH GFR
Anion gap: 8 (ref 5–15)
BUN: 21 mg/dL (ref 8–23)
CO2: 24 mmol/L (ref 22–32)
Calcium: 8.4 mg/dL — ABNORMAL LOW (ref 8.9–10.3)
Chloride: 100 mmol/L (ref 98–111)
Creatinine, Ser: 1.52 mg/dL — ABNORMAL HIGH (ref 0.44–1.00)
GFR, Estimated: 36 mL/min — ABNORMAL LOW
Glucose, Bld: 255 mg/dL — ABNORMAL HIGH (ref 70–99)
Potassium: 4 mmol/L (ref 3.5–5.1)
Sodium: 132 mmol/L — ABNORMAL LOW (ref 135–145)

## 2021-11-21 LAB — CBC
HCT: 32.9 % — ABNORMAL LOW (ref 36.0–46.0)
Hemoglobin: 10.9 g/dL — ABNORMAL LOW (ref 12.0–15.0)
MCH: 27.5 pg (ref 26.0–34.0)
MCHC: 33.1 g/dL (ref 30.0–36.0)
MCV: 83.1 fL (ref 80.0–100.0)
Platelets: 195 10*3/uL (ref 150–400)
RBC: 3.96 MIL/uL (ref 3.87–5.11)
RDW: 13.9 % (ref 11.5–15.5)
WBC: 5.1 10*3/uL (ref 4.0–10.5)
nRBC: 0 % (ref 0.0–0.2)

## 2021-11-21 MED ORDER — SULFAMETHOXAZOLE-TRIMETHOPRIM 800-160 MG PO TABS: 1.0000 | ORAL_TABLET | Freq: Two times a day (BID) | ORAL | 0 refills | Status: DC

## 2021-11-21 MED ORDER — LACTATED RINGERS IV SOLN: INTRAVENOUS | Status: DC

## 2021-11-21 MED ORDER — OXYCODONE HCL 5 MG PO TABS: 5.0000 mg | ORAL_TABLET | Freq: Three times a day (TID) | ORAL | 0 refills | Status: DC | PRN

## 2021-11-21 MED ORDER — METOPROLOL TARTRATE 25 MG PO TABS: 25.0000 mg | ORAL_TABLET | Freq: Two times a day (BID) | ORAL | 0 refills | Status: DC

## 2021-11-21 NOTE — Assessment & Plan Note (Addendum)
Baseline Cr 0.9, today, Cr up to 1.5 in setting of surgery. - IV fluids - Trend Cr - Continue losartan for now given severe range HTN

## 2021-11-21 NOTE — Evaluation (Signed)
Physical Therapy Evaluation and Discharge Patient Details Name: Ann Gomez MRN: [id number] DOB: 15-Mar-1951 Today's Date: 11/21/2021  History of Present Illness  Pt is a 71 y.o. F who presents with swollen and painful right great toe. Right foot x-ray showed distal ostseo and foreign body. S/p right 1st toe amputation 11/20/2021. Significant PMH: DM, HTN, obesity.  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 Pt reports excellent pain control. Education provided regarding weightbearing precautions, post op shoe use, and activity recommendations. Pt ambulating 60 ft with a walker and post op shoe donned modI; needs consistent visual/verbal cueing for maintaining precautions. Family present and supportive as well. Please see below for recommendations. No further acute PT needs. Thank you for this consult.     Recommendations for follow up therapy are one component of a multi-disciplinary discharge planning process, led by the attending physician.  Recommendations may be updated based on patient status, additional functional criteria and insurance authorization.  Follow Up Recommendations No PT follow up      Assistance Recommended at Discharge PRN  Patient can return home with the following  Assistance with cooking/housework;Assist for transportation;Help with stairs or ramp for entrance    Equipment Recommendations Rolling walker (2 wheels)  Recommendations for Other Services       Functional Status Assessment Patient has had a recent decline in their functional status and demonstrates the ability to make significant improvements in function in a reasonable and predictable amount of time.     Precautions / Restrictions Precautions Precautions: None Required Braces or Orthoses: Other Brace Other Brace: R post op shoe Restrictions Weight Bearing Restrictions: Yes RLE Weight Bearing:  (heel WB)      Mobility  Bed Mobility Overal bed mobility:  Independent                  Transfers Overall transfer level: Modified independent Equipment used: Rolling walker (2 wheels)               General transfer comment: cues for placing R foot anteriorly to prevent WB and for hand placement    Ambulation/Gait Ambulation/Gait assistance: Modified independent (Device/Increase time) Gait Distance (Feet): 60 Feet Assistive device: Rolling walker (2 wheels) Gait Pattern/deviations: Step-to pattern, Step-through pattern, Decreased stance time - right, Decreased weight shift to right Gait velocity: decreased     General Gait Details: Max cueing for R heel weightbearing, walker use, sequencing/technique  Stairs            Wheelchair Mobility    Modified Rankin (Stroke Patients Only)       Balance Overall balance assessment: Needs assistance Sitting-balance support: Feet supported Sitting balance-Leahy Scale: Good     Standing balance support: Bilateral upper extremity supported Standing balance-Leahy Scale: Fair                               Pertinent Vitals/Pain Pain Assessment Pain Assessment: No/denies pain    Home Living Family/patient expects to be discharged to:: Private residence Living Arrangements: Children Available Help at Discharge: Family Type of Home: House Home Access: Stairs to enter Entrance Stairs-Rails: None Entrance Stairs-Number of Steps: 2   Home Layout: One level Home Equipment: None      Prior Function Prior Level of Function : Independent/Modified Independent             Mobility Comments: working daycare  Hand Dominance        Extremity/Trunk Assessment   Upper Extremity Assessment Upper Extremity Assessment: Overall WFL for tasks assessed    Lower Extremity Assessment Lower Extremity Assessment: RLE deficits/detail RLE Deficits / Details: s/p R 1st toe amputation. Hip/knee WFL    Cervical / Trunk Assessment Cervical / Trunk  Assessment: Normal  Communication   Communication: No difficulties  Cognition Arousal/Alertness: Awake/alert Behavior During Therapy: WFL for tasks assessed/performed Overall Cognitive Status: Within Functional Limits for tasks assessed                                          General Comments      Exercises     Assessment/Plan    PT Assessment Patient does not need any further PT services  PT Problem List         PT Treatment Interventions      PT Goals (Current goals can be found in the Care Plan section)  Acute Rehab PT Goals Patient Stated Goal: go home PT Goal Formulation: All assessment and education complete, DC therapy    Frequency       Co-evaluation               AM-PAC PT "6 Clicks" Mobility  Outcome Measure Help needed turning from your back to your side while in a flat bed without using bedrails?: None Help needed moving from lying on your back to sitting on the side of a flat bed without using bedrails?: None Help needed moving to and from a bed to a chair (including a wheelchair)?: None Help needed standing up from a chair using your arms (e.g., wheelchair or bedside chair)?: None Help needed to walk in hospital room?: None Help needed climbing 3-5 steps with a railing? : A Little 6 Click Score: 23    End of Session Equipment Utilized During Treatment: Other (comment) (post op shoe) Activity Tolerance: Patient tolerated treatment well Patient left: in bed;with call bell/phone within reach Nurse Communication: Mobility status PT Visit Diagnosis: Difficulty in walking, not elsewhere classified (R26.2)    Time: 6283-6629 PT Time Calculation (min) (ACUTE ONLY): 19 min   Charges:   PT Evaluation $PT Eval Low Complexity: 1 Low          Lillia Pauls, PT, DPT Acute Rehabilitation Services Office [phone]   Norval Morton 11/21/2021, 12:53 PM

## 2021-11-21 NOTE — Discharge Summary (Signed)
Physician Discharge Summary   Patient: Ann Gomez MRN: [id number] DOB: 01/30/1951  Admit date:     11/18/2021  Discharge date: 11/21/21  Discharge Physician: Alberteen Sam   PCP: Sherral Hammers, FNP     Recommendations at discharge:  Follow up with Podiatry Dr. Lilian Kapur in 3 days Follow up with PCP Verdene Lennert in 1 week Verdene Lennert:  Please obtain BMP in 1 week and restart metformin if indicated Please check BP in 1 week and adjust regimen as needed Please refer for ABI     Discharge Diagnoses: Principal Problem:   Acute osteomyelitis, right hallux Active Problems:   Type 2 diabetes mellitus with polyneuropathy (HCC)   Hyperlipidemia   Obesity, BMI 31   Essential hypertension   Stage 3a chronic kidney disease (CKD) (HCC)   Hyponatremia      Hospital Course: [name] is a 71 y.o. F with DM, HTN, obesity who presented with swollen and painful right great toe  In the ED, right foot x-ray showed distal osteo and foreign body.  Blood cultures were ordered and patient was started on empiric IV antibiotics Rocephin and IV Flagyl.       * Toe osteomyelitis, right [facility]) Podiatry consulted, patient started on antibiotics.    Underwent partial toe amputation right great toe on 7/29 by Dr. Lilian Kapur.  Post-operative x-ray showed retained foreign body, discussed with Dr. Lilian Kapur who will monitor.  Has close follow up, discharged on Bactrim 10 days.    Hyponatremia Mild hyponatremia post-op.  Given fluids.  Repeat BMP in 1 week.  Stage 3a chronic kidney disease (CKD) (HCC) Cr baseline 1-1.2.  Mild expected creatinine bump to 1.5, in setting of restarting losartan.  AKI ruled out.  Given IV fluids, plan for repeat BMP in 1 week.    Essential hypertension Had elevated BP in the hospital.  Prior metoprolol was restarted and losartan continued.  BP 150s on day of discharge.  Recommended close PCP follow up.               The [facility] Controlled Substances Registry was reviewed for this patient prior to discharge.   Consultants: Podiatry Procedures performed:  - MRI foot - Partial toe amputation right great toe   Disposition: Home Diet recommendation: Diabetic   DISCHARGE MEDICATION: Allergies as of 11/21/2021       Reactions   Hydrochlorothiazide Other (See Comments)   Dizziness        Medication List     STOP taking these medications    metFORMIN 1000 MG tablet Commonly known as: GLUCOPHAGE       TAKE these medications    aspirin EC 81 MG tablet Take 1 tablet (81 mg total) by mouth daily.   dorzolamide-timolol 22.3-6.8 MG/ML ophthalmic solution Commonly known as: COSOPT Place 1 drop into both eyes 2 (two) times daily.   GINGER ROOT PO Take 550 mg by mouth daily.   glipiZIDE 10 MG tablet Commonly known as: GLUCOTROL Take 10 mg by mouth in the morning and at bedtime.   latanoprost 0.005 % ophthalmic solution Commonly known as: XALATAN Place 1 drop into both eyes at bedtime.   Levemir FlexTouch 100 UNIT/ML FlexPen Generic drug: insulin detemir Inject 17 Units into the skin 2 (two) times daily.   losartan 100 MG tablet Commonly known as: COZAAR Take 100 mg by mouth daily.   metoprolol tartrate 25 MG tablet Commonly known as: LOPRESSOR Take 1 tablet (25 mg total) by mouth  2 (two) times daily.   oxyCODONE 5 MG immediate release tablet Commonly known as: Oxy IR/ROXICODONE Take 1 tablet (5 mg total) by mouth every 8 (eight) hours as needed for moderate pain or breakthrough pain.   pravastatin 20 MG tablet Commonly known as: PRAVACHOL Take 20 mg by mouth at bedtime.   sulfamethoxazole-trimethoprim 800-160 MG tablet Commonly known as: BACTRIM DS Take 1 tablet by mouth 2 (two) times daily.   VITAMIN D3 PO Take 1 capsule by mouth daily.               Durable Medical Equipment  (From admission, onward)           Start     Ordered   11/21/21 0000  For home  use only DME 4 wheeled rolling walker with seat       Question:  Patient needs a walker to treat with the following condition  Answer:  Osteomyelitis (HCC)   11/21/21 1309              Discharge Care Instructions  (From admission, onward)           Start     Ordered   11/21/21 0000  Discharge wound care:       Comments: Leave dressing in place until you see Dr. Ninetta Lights this week   11/21/21 1309            Follow-up Information     Sherral Hammers, FNP Follow up.   Specialty: Family Medicine Why: Call for an appointment in 7-10 days to check Blood pressure and kidney function Contact information: [address] [place] [address] [phone]         Edwin Cap, DPM Follow up.   Specialty: Podiatry Contact information: [address] [place] [address] [phone]                 Discharge Instructions     Discharge instructions   Complete by: As directed    From Dr. Maryfrances Bunnell: You were admitted for a great toe infection This spread to the bone of the "distal phalanx" of the right big toe. You had amputation of this bone. You should continue antibiotics with Bactrim DS twice daily for 10 more days, starting tonight Sunday   Continue your home blood pressure medicines, losartan 100 mg daily and metoprolol 25 mg twice daily If you don't have any metoprolol, I sent you a refill  Go see your primary care doctor in 1 week and have them check your blood pressure and draw labs to check your kidney function.   For pain in the foot: Take acetaminophen 1000 mg (two extra strength tabs) up to three times daily Don't take more than that (6 tabs in a day)  If you still have pain, take oxycodone 2.5 mg (half tab) or 5 mg up to three times per day This is an opiate, so don't drive while taking it, dispose of any excess and keep away from children It will also make you constipated, so take Senna or miralax if you are taking  it.   Resume your home Levemir and glipizide diabetes medicines If you are neeeding refills of either of these, call your doctor tomorrow morning first thing For the next week, until you se your doctor, do NOT take your metformin. Ask your primary doctor if you should resume it when you see her next week   Discharge wound care:   Complete by: As directed  Leave dressing in place until you see Dr. Guy Sandifer this week   For home use only DME 4 wheeled rolling walker with seat   Complete by: As directed    Patient needs a walker to treat with the following condition: Osteomyelitis ([place])   Increase activity slowly   Complete by: As directed        Discharge Exam: Filed Weights   11/18/21 1455 11/19/21 0002 11/20/21 1403  Weight: 89.4 kg 86.5 kg 86.5 kg    General: Pt is alert, awake, not in acute distress Cardiovascular: RRR, nl S1-S2, no murmurs appreciated.   No LE edema.   Respiratory: Normal respiratory rate and rhythm.  CTAB without rales or wheezes. Abdominal: Abdomen soft and non-tender.  No distension or HSM.   MSK: Right foot in post-op shoed, wrapped, no cellulitis on ankle or calf.   Neuro/Psych: Strength symmetric in upper and lower extremities.  Judgment and insight appear normal.   Condition at discharge: good  The results of significant diagnostics from this hospitalization (including imaging, microbiology, ancillary and laboratory) are listed below for reference.   Imaging Studies: DG Foot 2 Views Right  Result Date: 11/21/2021 CLINICAL DATA:  S/p amputation of distal great toe for osteomyelitis. EXAM: RIGHT FOOT - 2 VIEW COMPARISON:  None Available. FINDINGS: There is been interval amputation of the great toe distal phalanx and distal aspect of the proximal phalanx. A 1 cm linear foreign body in the plantar lateral soft tissues of the great toe again identified at the level of the proximal phalanx. Surgical staples are identified. No other acute abnormality or  significant change noted. IMPRESSION: 1. Interval partial amputation of the great toe. 2. Unchanged 1 cm linear foreign body in the plantar lateral soft tissues of the great toe at the level of the proximal phalanx. Electronically Signed   By: Margarette Canada M.D.   On: 11/21/2021 10:51   MR FOOT RIGHT WO CONTRAST  Result Date: 11/19/2021 CLINICAL DATA:  Great toe wound.  Evaluate for osteomyelitis. EXAM: MRI OF THE RIGHT FOREFOOT WITHOUT CONTRAST TECHNIQUE: Multiplanar, multisequence MR imaging of the right foot was performed. No intravenous contrast was administered. COMPARISON:  Foot x-ray 11/18/2021 FINDINGS: Patient motion degrades image quality limiting evaluation. Bones/Joint/Cartilage No acute fracture or dislocation. Susceptibility artifact around the first proximal phalanx consistent with a metallic foreign body seen on recent x-ray likely reflecting a small needle fragment. Soft tissue wound of the tip of the first distal phalanx with bone destruction of the tuft of the first distal phalanx consistent with osteomyelitis. 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 Normal alignment. No joint effusion. Ligaments Collateral ligaments are intact.  Lisfranc ligament is intact. Muscles and Tendons Flexor, peroneal and extensor compartment tendons are intact. T2 hyperintense signal in the plantar musculature likely neurogenic. Soft tissue No fluid collection or hematoma. No soft tissue mass. Severe soft tissue edema around the ankle and dorsal aspect of the foot which may be reactive versus secondary to cellulitis. IMPRESSION: 1. Soft tissue wound of the tip of the first distal phalanx with bone destruction of the tuft of the first distal phalanx consistent with osteomyelitis. Electronically Signed   By: Kathreen Devoid M.D.   On: 11/19/2021 06:22   DG Foot Complete Right  Result Date: 11/18/2021 CLINICAL DATA:  Open wound great toe EXAM: RIGHT FOOT COMPLETE - 3+ VIEW COMPARISON:  None  Available. FINDINGS: No fracture or malalignment. Large wound/ulcer at the distal first digit. No soft tissue emphysema.  Fragmentation and destructive change involving the tuft of the distal phalanx first digit consistent with osteomyelitis. 7 mm linear density within the soft tissues, lateral side at the level of the mid proximal phalanx consistent with foreign body. Vascular calcifications. IMPRESSION: 1. Large wound/ulcer at the distal first digit with evidence for osteomyelitis involving the first distal phalanx. 2. 7 mm suspected linear foreign body within the soft tissues of the great toe as above. Electronically Signed   By: Jasmine Pang M.D.   On: 11/18/2021 15:52   OCT, Retina - OU - Both Eyes  Result Date: 10/28/2021 Right Eye Quality was good. Scan locations included subfoveal. Central Foveal Thickness: 226. Progression has worsened. Findings include abnormal foveal contour, cystoid macular edema, vitreomacular adhesion . Left Eye Quality was good. Scan locations included subfoveal. Central Foveal Thickness: 293. Progression has been stable. Findings include abnormal foveal contour, vitreous traction, vitreomacular adhesion . Notes OD resolution of recent CSME macular thickening in the right eye from February, now improved and maintained.  Diffuse atrophy present 1 week post vitrectomy, release of VMT and ILM peel for VMT trigger and center involved CSME not responsive to therapy. Vastly improved in 1 week.  Today   Microbiology: Results for orders placed or performed during the hospital encounter of 11/18/21  Blood culture (routine x 2)     Status: None (Preliminary result)   Collection Time: 11/18/21  3:27 PM   Specimen: BLOOD  Result Value Ref Range Status   Specimen Description BLOOD SITE NOT SPECIFIED  Final   Special Requests   Final    BOTTLES DRAWN AEROBIC AND ANAEROBIC Blood Culture adequate volume   Culture   Final    NO GROWTH 3 DAYS Performed at [facility], [address], [place], [place] [address]    Report Status PENDING  Incomplete  Blood culture (routine x 2)     Status: None (Preliminary result)   Collection Time: 11/18/21  3:27 PM   Specimen: BLOOD  Result Value Ref Range Status   Specimen Description BLOOD SITE NOT SPECIFIED  Final   Special Requests   Final    BOTTLES DRAWN AEROBIC AND ANAEROBIC Blood Culture adequate volume   Culture   Final    NO GROWTH 3 DAYS Performed at [facility], [address], [place], [place] [address]    Report Status PENDING  Incomplete  MRSA Next Gen by PCR, Nasal     Status: None   Collection Time: 11/19/21 12:07 AM   Specimen: Nasal Mucosa; Nasal Swab  Result Value Ref Range Status   MRSA by PCR Next Gen NOT DETECTED NOT DETECTED Final    Comment: (NOTE) The GeneXpert MRSA Assay (FDA approved for NASAL specimens only), is one component of a comprehensive MRSA colonization surveillance program. It is not intended to diagnose MRSA infection nor to guide or monitor treatment for MRSA infections. Test performance is not FDA approved in patients less than 41 years old. Performed at [facility], [address], [place], [place] [address]     Labs: CBC: Recent Labs  Lab 11/18/21 1528 11/19/21 0022 11/20/21 0022 11/21/21 0129  WBC 4.9 4.5 5.2 5.1  NEUTROABS 2.9 2.9  --   --   HGB 12.7 11.9* 10.4* 10.9*  HCT 38.8 35.9* 31.8* 32.9*  MCV 84.2 82.7 83.5 83.1  PLT 222 193 178 195   Basic Metabolic Panel: Recent Labs  Lab 11/18/21 1526 11/19/21 0022 11/20/21 0022 11/21/21 0129  NA 136 137 137 132*  K 3.9 3.8 3.7 4.0  CL 102 104 105 100  CO2 24 24 25 24   GLUCOSE 169* 119* 98 255*  BUN 18 15 13 21   CREATININE 1.25* 1.06* 1.27* 1.52*  CALCIUM 9.7 9.5 8.9 8.4*  MG  --  1.4*  --   --   PHOS  --  3.1  --   --    Liver Function Tests: Recent Labs  Lab 11/18/21 1526 11/19/21 0022 11/20/21 0022  AST 16 14* 14*  ALT 11 10 10   ALKPHOS 74 68 53  BILITOT 0.5 1.0 0.9  PROT 8.0  7.3 6.2*  ALBUMIN 3.5 3.1* 2.6*   CBG: Recent Labs  Lab 11/20/21 1323 11/20/21 1602 11/20/21 2020 11/21/21 0816 11/21/21 1150  GLUCAP 97 88 251* 195* 82    Discharge time spent: approximately 35 minutes spent on discharge counseling, evaluation of patient on day of discharge, and coordination of discharge planning with nursing, social work, pharmacy and case management  Signed: Edwin Dada, MD [facility] 11/21/2021

## 2021-11-21 NOTE — TOC Transition Note (Signed)
Transition of Care [facility]) - CM/SW Discharge Note   Patient Details  Name: SHAUNTIA LEVENGOOD MRN: [id number] Date of Birth: 1950/12/28  Transition of Care [facility]) CM/SW Contact:  Bess Kinds, RN Phone Number: [phone] 11/21/2021, 1:42 PM   Clinical Narrative:     Spoke with patient on hospital room phone to discuss post acute transition. She stated that her daughter and a church member were visiting. Discussed recommendations for RW - referral to [employer] for delivery to the room. Her daughter to provide transportation home. Verified that she will pick up her DC meds. No further TOC needs identified at this time.   Final next level of care: Home/Self Care Barriers to Discharge: No Barriers Identified   Patient Goals and CMS Choice Patient states their goals for this hospitalization and ongoing recovery are:: home CMS Medicare.gov Compare Post Acute Care list provided to:: Patient Choice offered to / list presented to : Patient  Discharge Placement                       Discharge Plan and Services                DME Arranged: Walker rolling DME Agency: [employer] Date DME Agency Contacted: 11/21/21 Time DME Agency Contacted: [phone] Representative spoke with at DME Agency: Jasmine HH Arranged: NA HH Agency: NA        Social Determinants of Health (SDOH) Interventions     Readmission Risk Interventions     No data to display

## 2021-11-21 NOTE — Assessment & Plan Note (Signed)
Na down to 132 post op - IV fluids and trend

## 2021-11-21 NOTE — Progress Notes (Signed)
  Subjective:  Patient ID: Ann Gomez, female    DOB: [birth year],  MRN: [id number]  Resting comfortably POD#1 partial right great toe amputation   Negative for chest pain and shortness of breath Fever: no Night sweats: no Review of all other systems is negative Objective:   Vitals:   11/21/21 0812 11/21/21 0850  BP: (!) 173/88 (!) 155/82  Pulse: 66   Resp: 16   Temp: 98 F (36.7 C)   SpO2: 100%    General AA&O x3. Normal mood and affect.  Vascular Foot warm and well perfused Brisk capillary refill to all digits. Pedal hair present.  Neurologic Epicritic sensation grossly reduced.  Dermatologic Clean dry and intact dressings  Orthopedic: MMT 5/5 in dorsiflexion, plantarflexion, inversion, and eversion. Normal joint ROM without pain or crepitus.    Assessment & Plan:  Patient was evaluated and treated and all questions answered.  POD#1 s/p partial R 1st toe amputation -FB on x-ray was not readily visible yesterday in amputation site, at margin there was no further necrosis and there was not a clearly identifiable puncture wound. I suspect this FB is a remote injury, we have no further x-rays for comparison before this admission. Will continue to monitor clinical progress here. Can be removed as outpatient if it becomes symptomatic -No dressing changes necessary -WBAT to heel -OK to d/c from my standpoint. Recommend 10 days PO abx -Outpatient f/u will be arranged  Edwin Cap, DPM  Accessible via secure chat for questions or concerns.

## 2021-11-23 ENCOUNTER — Ambulatory Visit (INDEPENDENT_AMBULATORY_CARE_PROVIDER_SITE_OTHER): Payer: Medicare Other | Admitting: Podiatry

## 2021-11-23 ENCOUNTER — Encounter: Payer: Self-pay | Admitting: Podiatry

## 2021-11-23 DIAGNOSIS — E1142 Type 2 diabetes mellitus with diabetic polyneuropathy: Secondary | ICD-10-CM | POA: Diagnosis not present

## 2021-11-23 DIAGNOSIS — S90851A Superficial foreign body, right foot, initial encounter: Secondary | ICD-10-CM

## 2021-11-23 DIAGNOSIS — Z89421 Acquired absence of other right toe(s): Secondary | ICD-10-CM | POA: Diagnosis not present

## 2021-11-23 DIAGNOSIS — E559 Vitamin D deficiency, unspecified: Secondary | ICD-10-CM | POA: Insufficient documentation

## 2021-11-23 LAB — CULTURE, BLOOD (ROUTINE X 2)
Culture: NO GROWTH
Culture: NO GROWTH
Special Requests: ADEQUATE
Special Requests: ADEQUATE

## 2021-11-23 NOTE — Progress Notes (Signed)
  Subjective:  Patient ID: Ann Gomez, female    DOB: [birth year],  MRN: [id number]  Chief Complaint  Patient presents with   Routine Post Op      DOS 11/20/21 partial right great toe amputation -doing very well - no pain at all   71 y.o. female returns for post-op check.  She is doing well she is not having much pain  Review of Systems: Negative except as noted in the HPI. Denies N/V/F/Ch.   Objective:  There were no vitals filed for this visit. There is no height or weight on file to calculate BMI. Constitutional Well developed. Well nourished.  Vascular Foot warm and well perfused. Capillary refill normal to all digits.  Calf is soft and supple, no posterior calf or knee pain, negative Homans' sign  Neurologic Normal speech. Oriented to person, place, and time. Epicritic sensation to light touch grossly present bilaterally.  Dermatologic Skin healing well without signs of infection. Skin edges well coapted without signs of infection.  Orthopedic: Tenderness to palpation noted about the surgical site.   We reviewed her postoperative graphs that showed the persistent metallic foreign body Assessment:   1. Type 2 diabetes mellitus with polyneuropathy (HCC)   2. 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 Foreign body in right foot, initial encounter    Plan:  Patient was evaluated and treated and all questions answered.  S/p foot surgery right -Progressing as expected post-operatively. -As we discussed the foreign body is likely remote and there is no clear puncture wound and it was not visible in her amputation site.  We will continue to monitor if requires extraction we will proceed with this at a later date -WB Status: WBAT in surgical shoe -Sutures: Removed in 2 weeks. -Medications: No refills required she will finish her current antibiotics -Foot redressed.  Return in about 2 weeks (around 12/07/2021) for post op (no x-rays), suture removal.

## 2021-11-24 ENCOUNTER — Ambulatory Visit (INDEPENDENT_AMBULATORY_CARE_PROVIDER_SITE_OTHER): Payer: Medicare Other | Admitting: Podiatry

## 2021-11-24 ENCOUNTER — Telehealth: Payer: Self-pay | Admitting: *Deleted

## 2021-11-24 DIAGNOSIS — Z09 Encounter for follow-up examination after completed treatment for conditions other than malignant neoplasm: Secondary | ICD-10-CM

## 2021-11-24 LAB — SURGICAL PATHOLOGY

## 2021-11-24 NOTE — Telephone Encounter (Signed)
Patient's daughter calling because all of her bandages came off overnight, can she be seen?

## 2021-11-24 NOTE — Progress Notes (Signed)
Patient seen today for re-wrap of her dressing. Patient stated she was sleeping and accidentally removed the dressing to her right foot. Patient denies any pain, fever, nausea, vomiting, chest pain. Staples and suture intact at this time. Applied adhesive dressing to incision site followed my 4x4 gauze, gauze wrap and secured with ace bandage and stockinet. Provided patient with a surgical shoe today.  Advise patient to call office with any concerns or questions. If any signs or symptoms of infection occur to go to the ED.

## 2021-11-25 ENCOUNTER — Encounter (INDEPENDENT_AMBULATORY_CARE_PROVIDER_SITE_OTHER): Payer: Medicare Other | Admitting: Ophthalmology

## 2021-11-25 DIAGNOSIS — S98921A Partial traumatic amputation of right foot, level unspecified, initial encounter: Secondary | ICD-10-CM | POA: Insufficient documentation

## 2021-11-30 NOTE — Telephone Encounter (Signed)
Patient seen 11/24/21 by Dr. Lilian Kapur.

## 2021-12-02 NOTE — Progress Notes (Deleted)
Cardiology Office Note:   Date:  12/02/2021  NAME:  Ann Gomez    MRN: [id number] DOB:  1951-02-09   PCP:  Sherral Hammers, FNP  Cardiologist:  None  Electrophysiologist:  None   Referring MD: Dot Been, FNP   No chief complaint on file. ***  History of Present Illness:   Ann Gomez is a 71 y.o. female with a hx of HTN, HLD, DM who is being seen today for the evaluation of abnormal EKG at the request of Dot Been, FNP.  Problem List HTN DM -A1c 8.1 3. HLD -T chol 162, HDL 79, LDL 69, TG 73  Past Medical History: Past Medical History:  Diagnosis Date   Diabetes mellitus    Edema    Essential hypertension, benign    Hypertension    Obesity, unspecified     Past Surgical History: Past Surgical History:  Procedure Laterality Date   AMPUTATION Right 11/20/2021   Procedure: AMPUTATION GREAT TOE;  Surgeon: Edwin Cap, DPM;  Location: [facility] OR;  Service: Podiatry;  Laterality: Right;   CATARACT EXTRACTION     CESAREAN SECTION     EYE SURGERY      Current Medications: No outpatient medications have been marked as taking for the 12/03/21 encounter (Appointment) with O'Neal, Ronnald Ramp, MD.     Allergies:    Hydrochlorothiazide   Social History: Social History   Socioeconomic History   Marital status: Single    Spouse name: Not on file   Number of children: Not on file   Years of education: Not on file   Highest education level: Not on file  Occupational History   Not on file  Tobacco Use   Smoking status: Never   Smokeless tobacco: Never  Vaping Use   Vaping Use: Never used  Substance and Sexual Activity   Alcohol use: Yes    Comment: occasional   Drug use: No   Sexual activity: Not on file  Other Topics Concern   Not on file  Social History Narrative   Not on file   Social Determinants of Health   Financial Resource Strain: Not on file  Food Insecurity: Not on file  Transportation Needs: Not on file  Physical Activity:  Not on file  Stress: Not on file  Social Connections: Not on file     Family History: The patient's ***Family history is unknown by patient.  ROS:   All other ROS reviewed and negative. Pertinent positives noted in the HPI.     EKGs/Labs/Other Studies Reviewed:   The following studies were personally reviewed by me today:  EKG:  EKG is *** ordered today.  The ekg ordered today demonstrates ***, and was personally reviewed by me.   Recent Labs: 11/19/2021: Magnesium 1.4 11/20/2021: ALT 10 11/21/2021: BUN 21; Creatinine, Ser 1.52; Hemoglobin 10.9; Platelets 195; Potassium 4.0; Sodium 132   Recent Lipid Panel    Component Value Date/Time   CHOL 148 07/08/2010 2320   TRIG 54 07/08/2010 2320   HDL 76 07/08/2010 2320   CHOLHDL 1.9 Ratio 07/08/2010 2320   VLDL 11 07/08/2010 2320   LDLCALC 61 07/08/2010 2320    Physical Exam:   VS:  There were no vitals taken for this visit.   Wt Readings from Last 3 Encounters:  11/20/21 190 lb 11.2 oz (86.5 kg)  08/16/21 197 lb (89.4 kg)  07/12/15 236 lb (107 kg)    General: Well nourished, well developed, in no acute  distress Head: Atraumatic, normal size  Eyes: PEERLA, EOMI  Neck: Supple, no JVD Endocrine: No thryomegaly Cardiac: Normal S1, S2; RRR; no murmurs, rubs, or gallops Lungs: Clear to auscultation bilaterally, no wheezing, rhonchi or rales  Abd: Soft, nontender, no hepatomegaly  Ext: No edema, pulses 2+ Musculoskeletal: No deformities, BUE and BLE strength normal and equal Skin: Warm and dry, no rashes   Neuro: Alert and oriented to person, place, time, and situation, CNII-XII grossly intact, no focal deficits  Psych: Normal mood and affect   ASSESSMENT:   Ann Gomez is a 72 y.o. female who presents for the following: No diagnosis found.  PLAN:   There are no diagnoses linked to this encounter.  {Are you ordering a CV Procedure (e.g. stress test, cath, DCCV, TEE, etc)?   Press F2        :[id number]}  Disposition: No  follow-ups on file.  Medication Adjustments/Labs and Tests Ordered: Current medicines are reviewed at length with the patient today.  Concerns regarding medicines are outlined above.  No orders of the defined types were placed in this encounter.  No orders of the defined types were placed in this encounter.   There are no Patient Instructions on file for this visit.   Time Spent with Patient: I have spent a total of *** minutes with patient reviewing hospital notes, telemetry, EKGs, labs and examining the patient as well as establishing an assessment and plan that was discussed with the patient.  > 50% of time was spent in direct patient care.  Signed, Lenna Gilford. Flora Lipps, MD, [facility]  [facility]  [address] [place], [place] [address] [phone]  12/02/2021 7:03 PM

## 2021-12-03 ENCOUNTER — Ambulatory Visit: Payer: Medicare Other | Admitting: Cardiovascular Disease

## 2021-12-03 DIAGNOSIS — E782 Mixed hyperlipidemia: Secondary | ICD-10-CM

## 2021-12-03 DIAGNOSIS — I10 Essential (primary) hypertension: Secondary | ICD-10-CM

## 2021-12-03 DIAGNOSIS — R9431 Abnormal electrocardiogram [ECG] [EKG]: Secondary | ICD-10-CM

## 2021-12-07 ENCOUNTER — Ambulatory Visit (INDEPENDENT_AMBULATORY_CARE_PROVIDER_SITE_OTHER): Payer: Medicare Other | Admitting: Podiatry

## 2021-12-07 ENCOUNTER — Telehealth: Payer: Self-pay | Admitting: Podiatry

## 2021-12-07 ENCOUNTER — Encounter: Payer: Self-pay | Admitting: Podiatry

## 2021-12-07 DIAGNOSIS — Z89421 Acquired absence of other right toe(s): Secondary | ICD-10-CM | POA: Diagnosis not present

## 2021-12-07 NOTE — Telephone Encounter (Signed)
Line busy - tried to call patient to cancel appointment on 8/22 , Dr Lilian Kapur is going to cut her nails on appt 12/28/21

## 2021-12-07 NOTE — Progress Notes (Signed)
  Subjective:  Patient ID: Ann Gomez, female    DOB: [birth year],  MRN: [id number]  Chief Complaint  Patient presents with   Routine Post Op    DOS 11/20/21 partial right great toe amputation- patient denies any pain at this time. Patient stated she elevates extremity throughtout the day. Sutures and stapes intact.    71 y.o. female returns for post-op check.  She is doing well   Review of Systems: Negative except as noted in the HPI. Denies N/V/F/Ch.   Objective:  There were no vitals filed for this visit. There is no height or weight on file to calculate BMI. Constitutional Well developed. Well nourished.  Vascular Foot warm and well perfused. Capillary refill normal to all digits.  Calf is soft and supple, no posterior calf or knee pain, negative Homans' sign  Neurologic Normal speech. Oriented to person, place, and time. Epicritic sensation to light touch grossly present bilaterally.  Dermatologic Skin healing well without signs of infection. Skin edges well coapted without signs of infection.  Orthopedic: She has no tenderness to palpation noted about the surgical site.   We reviewed her postoperative graphs that showed the persistent metallic foreign body Assessment:   1. 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  S/p foot surgery right -Appears to be healed.  Sutures removed today uneventfully.  She may resume regular bathing and shoe gear.  I will see her back in 3 weeks for final check and they will transition her to routine diabetic foot care Return in about 3 weeks (around 12/28/2021) for post op (no x-rays).

## 2021-12-12 ENCOUNTER — Encounter (HOSPITAL_COMMUNITY): Payer: Self-pay

## 2021-12-12 ENCOUNTER — Emergency Department (HOSPITAL_COMMUNITY)
Admission: EM | Admit: 2021-12-12 | Discharge: 2021-12-12 | Disposition: A | Discharge status: Home / Self Care | Payer: Medicare Other | Attending: Emergency Medicine | Admitting: Emergency Medicine

## 2021-12-12 ENCOUNTER — Other Ambulatory Visit: Payer: Self-pay

## 2021-12-12 DIAGNOSIS — Z7982 Long term (current) use of aspirin: Secondary | ICD-10-CM | POA: Insufficient documentation

## 2021-12-12 DIAGNOSIS — E162 Hypoglycemia, unspecified: Secondary | ICD-10-CM | POA: Diagnosis present

## 2021-12-12 DIAGNOSIS — Z79899 Other long term (current) drug therapy: Secondary | ICD-10-CM | POA: Diagnosis not present

## 2021-12-12 DIAGNOSIS — R4182 Altered mental status, unspecified: Secondary | ICD-10-CM | POA: Insufficient documentation

## 2021-12-12 LAB — COMPREHENSIVE METABOLIC PANEL
ALT: 22 U/L (ref 0–44)
AST: 24 U/L (ref 15–41)
Albumin: 3.3 g/dL — ABNORMAL LOW (ref 3.5–5.0)
Alkaline Phosphatase: 63 U/L (ref 38–126)
Anion gap: 8 (ref 5–15)
BUN: 22 mg/dL (ref 8–23)
CO2: 22 mmol/L (ref 22–32)
Calcium: 9.2 mg/dL (ref 8.9–10.3)
Chloride: 109 mmol/L (ref 98–111)
Creatinine, Ser: 1.25 mg/dL — ABNORMAL HIGH (ref 0.44–1.00)
GFR, Estimated: 46 mL/min — ABNORMAL LOW (ref >60)
Glucose, Bld: 91 mg/dL (ref 70–99)
Potassium: 4 mmol/L (ref 3.5–5.1)
Sodium: 139 mmol/L (ref 135–145)
Total Bilirubin: 0.8 mg/dL (ref 0.3–1.2)
Total Protein: 7 g/dL (ref 6.5–8.1)

## 2021-12-12 LAB — CBC WITH DIFFERENTIAL/PLATELET
Abs Immature Granulocytes: 0.01 10*3/uL (ref 0.00–0.07)
Basophils Absolute: 0 10*3/uL (ref 0.0–0.1)
Basophils Relative: 1 %
Eosinophils Absolute: 0.1 10*3/uL (ref 0.0–0.5)
Eosinophils Relative: 2 %
HCT: 35.7 % — ABNORMAL LOW (ref 36.0–46.0)
Hemoglobin: 11.6 g/dL — ABNORMAL LOW (ref 12.0–15.0)
Immature Granulocytes: 0 %
Lymphocytes Relative: 29 %
Lymphs Abs: 0.9 10*3/uL (ref 0.7–4.0)
MCH: 27.4 pg (ref 26.0–34.0)
MCHC: 32.5 g/dL (ref 30.0–36.0)
MCV: 84.2 fL (ref 80.0–100.0)
Monocytes Absolute: 0.3 10*3/uL (ref 0.1–1.0)
Monocytes Relative: 10 %
Neutro Abs: 1.8 10*3/uL (ref 1.7–7.7)
Neutrophils Relative %: 58 %
Platelets: 174 10*3/uL (ref 150–400)
RBC: 4.24 MIL/uL (ref 3.87–5.11)
RDW: 14.4 % (ref 11.5–15.5)
WBC: 3.1 10*3/uL — ABNORMAL LOW (ref 4.0–10.5)
nRBC: 0 % (ref 0.0–0.2)

## 2021-12-12 LAB — CBG MONITORING, ED
Glucose-Capillary: 88 mg/dL (ref 70–99)
Glucose-Capillary: 166 mg/dL — ABNORMAL HIGH (ref 70–99)

## 2021-12-12 MED ORDER — HYDRALAZINE HCL 25 MG PO TABS
25.0000 mg | ORAL_TABLET | Freq: Once | ORAL | Status: AC
  Administered 2021-12-12: 25 mg via ORAL
  Filled 2021-12-12: qty 1

## 2021-12-12 NOTE — Discharge Instructions (Signed)
Please call your family doctor tomorrow and let them know how your blood sugar had gone low and see if they want to change your diabetes regimen for home.  Also please let them know how he has been struggling with your blood pressures despite being on hydralazine and see if they want to make any changes.  Please return to the emergency department for recurrent or persistent low blood sugar or if you develop chest pain difficulty breathing headache neck pain one-sided numbness or weakness or difficulty with speech or swallowing.

## 2021-12-12 NOTE — ED Triage Notes (Signed)
Pt arrived to ED via EMS from home w/ c/o hypoglycemia and AMS. Pt initial CBG 42. After eating and drinking juice CBG was 62. EMS reports pt is less altered now than initially. EMS reports pt A&Ox3. Pt c/o dizziness and weakness. EMS reports pt was recently taken off of metformin temporarily to monitor blood work per family. Pt was started on hydralazine recently. EMS reports pt was cool and clammy and has bilateral lower extremity edema noted, but not new per family. EMS also reported that pt recently had a toe amputated. EMS VS: BP 170/92, HR 60. 20g L AC

## 2021-12-12 NOTE — ED Provider Notes (Signed)
[facility] EMERGENCY DEPARTMENT Provider Note   CSN: [id number] Arrival date & time: 12/12/21  1111     History  Chief Complaint  Patient presents with   Hypoglycemia   Altered Mental Status    Ann Gomez is a 71 y.o. female.  71 yo F with a chief complaints of low blood sugar.  EMS was called and her blood sugar was in the 40s.  She was able to eat and drink.  She has had some improvement since.  She denies any other specific concerns.  Has felt a little bit fatigued.  She had an amputation of one of her toes and feels like that is been doing better.  She denies cough congestion or fever.  Feels like she is been eating and drinking normally.  Per the EMS report the patient was recently taken off metformin and started on hydralazine.   Hypoglycemia Associated symptoms: altered mental status   Altered Mental Status      Home Medications Prior to Admission medications   Medication Sig Start Date End Date Taking? Authorizing Provider  aspirin EC 81 MG tablet Take 1 tablet (81 mg total) by mouth daily. 04/16/12   Ghimire, Werner Lean, MD  Cholecalciferol (VITAMIN D3 PO) Take 1 capsule by mouth daily.    [provider]  dorzolamide-timolol (COSOPT) 22.3-6.8 MG/ML ophthalmic solution Place 1 drop into both eyes 2 (two) times daily. Patient not taking: Reported on 11/19/2021 11/28/19   [provider]  Ginger, Zingiber officinalis, (GINGER ROOT PO) Take 550 mg by mouth daily.    [provider]  glipiZIDE (GLUCOTROL) 10 MG tablet Take 10 mg by mouth in the morning and at bedtime.    [provider]  hydrochlorothiazide (HYDRODIURIL) 25 MG tablet Take 25 mg by mouth daily. 11/21/21   [provider]  latanoprost (XALATAN) 0.005 % ophthalmic solution Place 1 drop into both eyes at bedtime. 12/17/19   [provider]  LEVEMIR FLEXTOUCH 100 UNIT/ML FlexTouch Pen Inject 17 Units into the skin 2 (two) times daily.  04/02/21   [provider]  losartan (COZAAR) 100 MG tablet Take 100 mg by mouth daily. 11/28/19   [provider]  metFORMIN (GLUCOPHAGE) 1000 MG tablet SMARTSIG:1 Tablet(s) By Mouth Morning-Evening 11/21/21   [provider]  metoprolol tartrate (LOPRESSOR) 25 MG tablet Take 1 tablet (25 mg total) by mouth 2 (two) times daily. 11/21/21 12/21/21  Danford, Earl Lites, MD  oxyCODONE (OXY IR/ROXICODONE) 5 MG immediate release tablet Take 1 tablet (5 mg total) by mouth every 8 (eight) hours as needed for moderate pain or breakthrough pain. 11/21/21   Danford, Earl Lites, MD  pravastatin (PRAVACHOL) 20 MG tablet Take 20 mg by mouth at bedtime. 11/28/19   [provider]  sulfamethoxazole-trimethoprim (BACTRIM DS) 800-160 MG tablet Take 1 tablet by mouth 2 (two) times daily. 11/21/21   Danford, Earl Lites, MD  metoprolol succinate (TOPROL-XL) 100 MG 24 hr tablet Take 100 mg by mouth daily. Take with or immediately following a meal.  04/16/12  [provider]  rosuvastatin (CRESTOR) 5 MG tablet Take 5 mg by mouth daily.  04/16/12  [provider]      Allergies    Hydrochlorothiazide    Review of Systems   Review of Systems  Physical Exam Updated Vital Signs BP (!) 221/83   Pulse 66   Temp (!) 97.5 F (36.4 C) (Oral)   Resp (!) 22   SpO2 99%  Physical Exam Vitals and nursing note reviewed.  Constitutional:      General: She is not in acute distress.    Appearance: She is well-developed. She is not diaphoretic.  HENT:     Head: Normocephalic and atraumatic.  Eyes:     Pupils: Pupils are equal, round, and reactive to light.  Cardiovascular:     Rate and Rhythm: Normal rate and regular rhythm.     Heart sounds: No murmur heard.    No friction rub. No gallop.  Pulmonary:     Effort: Pulmonary effort is normal.     Breath sounds: No wheezing or rales.  Abdominal:     General: There is no distension.     Palpations: Abdomen is soft.      Tenderness: There is no abdominal tenderness.  Musculoskeletal:        General: No tenderness.     Cervical back: Normal range of motion and neck supple.  Skin:    General: Skin is warm and dry.  Neurological:     Mental Status: She is alert and oriented to person, place, and time.  Psychiatric:        Behavior: Behavior normal.     ED Results / Procedures / Treatments   Labs (all labs ordered are listed, but only abnormal results are displayed) Labs Reviewed  CBC WITH DIFFERENTIAL/PLATELET - Abnormal; Notable for the following components:      Result Value   WBC 3.1 (*)    Hemoglobin 11.6 (*)    HCT 35.7 (*)    All other components within normal limits  COMPREHENSIVE METABOLIC PANEL - Abnormal; Notable for the following components:   Creatinine, Ser 1.25 (*)    Albumin 3.3 (*)    GFR, Estimated 46 (*)    All other components within normal limits  CBG MONITORING, ED  CBG MONITORING, ED    EKG None  Radiology No results found.  Procedures Procedures    Medications Ordered in ED Medications  hydrALAZINE (APRESOLINE) tablet 25 mg (25 mg Oral Given 12/12/21 1250)    ED Course/ Medical Decision Making/ A&P                           Medical Decision Making Amount and/or Complexity of Data Reviewed Labs: ordered.  Risk Prescription drug management.   71 yo F with a chief complaint of hypoglycemia.  This was noted this morning.  Was able to eat and drink.  She has been complaining of some fatigue.  Was recently started on hydralazine.  Was recently taken off of metformin.  She is not sure of her diabetes medicines.  On my record review it appears that she is on glipizide.  Patient's family has arrived and they are concerned that maybe she took her diabetes medicine and did not eat that the patient seems to think that she did have something to eat last night.  She continues to feel well and would like to go home.  Her blood pressure was high here it could be  due to her missing her lunchtime dose of hydralazine.  Given here.  No significant electrolyte abnormality no significant anemia.  Patient's blood sugar remains normoglycemic.  Will discharge home.  PCP follow-up.  1:26 PM:  I have discussed the diagnosis/risks/treatment options with the patient and family.  Evaluation and diagnostic testing in the emergency department does not suggest an emergent condition requiring admission or immediate intervention beyond what  has been performed at this time.  They will follow up with  PCP. We also discussed returning to the ED immediately if new or worsening sx occur. We discussed the sx which are most concerning (e.g., sudden worsening pain, fever, inability to tolerate by mouth) that necessitate immediate return. Medications administered to the patient during their visit and any new prescriptions provided to the patient are listed below.  Medications given during this visit Medications  hydrALAZINE (APRESOLINE) tablet 25 mg (25 mg Oral Given 12/12/21 1250)     The patient appears reasonably screen and/or stabilized for discharge and I doubt any other medical condition or other [facility] requiring further screening, evaluation, or treatment in the ED at this time prior to discharge.          Final Clinical Impression(s) / ED Diagnoses Final diagnoses:  Hypoglycemia    Rx / DC Orders ED Discharge Orders     None         Melene Plan, DO 12/12/21 1326

## 2021-12-14 ENCOUNTER — Ambulatory Visit: Payer: Medicare Other | Admitting: Podiatry

## 2021-12-20 ENCOUNTER — Encounter: Payer: Self-pay | Admitting: Cardiology

## 2021-12-20 NOTE — Progress Notes (Deleted)
Referring-Ann Grace Blight FNP Reason for referral-hypertension  HPI: 71 year old female for evaluation of hypertension at request of Sherron Flemings FNP.  Laboratories August 2023 showed total cholesterol 162, HDL 79 and LDL 69.  Sodium 138, potassium 4.8, creatinine 1.20.  Current Outpatient Medications  Medication Sig Dispense Refill   aspirin EC 81 MG tablet Take 1 tablet (81 mg total) by mouth daily. 30 tablet 3   Cholecalciferol (VITAMIN D3 PO) Take 1 capsule by mouth daily.     dorzolamide-timolol (COSOPT) 22.3-6.8 MG/ML ophthalmic solution Place 1 drop into both eyes 2 (two) times daily. (Patient not taking: Reported on 11/19/2021)     Ginger, Zingiber officinalis, (GINGER ROOT PO) Take 550 mg by mouth daily.     glipiZIDE (GLUCOTROL) 10 MG tablet Take 10 mg by mouth in the morning and at bedtime.     hydrochlorothiazide (HYDRODIURIL) 25 MG tablet Take 25 mg by mouth daily.     latanoprost (XALATAN) 0.005 % ophthalmic solution Place 1 drop into both eyes at bedtime.     LEVEMIR FLEXTOUCH 100 UNIT/ML FlexTouch Pen Inject 17 Units into the skin 2 (two) times daily.     losartan (COZAAR) 100 MG tablet Take 100 mg by mouth daily.     metFORMIN (GLUCOPHAGE) 1000 MG tablet SMARTSIG:1 Tablet(s) By Mouth Morning-Evening     metoprolol tartrate (LOPRESSOR) 25 MG tablet Take 1 tablet (25 mg total) by mouth 2 (two) times daily. 60 tablet 0   oxyCODONE (OXY IR/ROXICODONE) 5 MG immediate release tablet Take 1 tablet (5 mg total) by mouth every 8 (eight) hours as needed for moderate pain or breakthrough pain. 15 tablet 0   pravastatin (PRAVACHOL) 20 MG tablet Take 20 mg by mouth at bedtime.     sulfamethoxazole-trimethoprim (BACTRIM DS) 800-160 MG tablet Take 1 tablet by mouth 2 (two) times daily. 20 tablet 0   No current facility-administered medications for this visit.    Allergies  Allergen Reactions   Hydrochlorothiazide Other (See Comments)    Dizziness     Past Medical History:   Diagnosis Date   Diabetes mellitus    Edema    Essential hypertension, benign    Hypertension    Obesity, unspecified     Past Surgical History:  Procedure Laterality Date   AMPUTATION Right 11/20/2021   Procedure: AMPUTATION GREAT TOE;  Surgeon: Edwin Cap, DPM;  Location: [facility];  Service: Podiatry;  Laterality: Right;   CATARACT EXTRACTION     CESAREAN SECTION     EYE SURGERY      Social History   Socioeconomic History   Marital status: Single    Spouse name: Not on file   Number of children: Not on file   Years of education: Not on file   Highest education level: Not on file  Occupational History   Not on file  Tobacco Use   Smoking status: Never   Smokeless tobacco: Never  Vaping Use   Vaping Use: Never used  Substance and Sexual Activity   Alcohol use: Yes    Comment: occasional   Drug use: No   Sexual activity: Not on file  Other Topics Concern   Not on file  Social History Narrative   Not on file   Social Determinants of Health   Financial Resource Strain: Not on file  Food Insecurity: Not on file  Transportation Needs: Not on file  Physical Activity: Not on file  Stress: Not on file  Social Connections: Not on  file  Intimate Partner Violence: Not on file    Family History  Family history unknown: Yes    ROS: no fevers or chills, productive cough, hemoptysis, dysphasia, odynophagia, melena, hematochezia, dysuria, hematuria, rash, seizure activity, orthopnea, PND, pedal edema, claudication. Remaining systems are negative.  Physical Exam:   There were no vitals taken for this visit.  General:  Well developed/well nourished in NAD Skin warm/dry Patient not depressed No peripheral clubbing Back-normal HEENT-normal/normal eyelids Neck supple/normal carotid upstroke bilaterally; no bruits; no JVD; no thyromegaly chest - CTA/ normal expansion CV - RRR/normal S1 and S2; no murmurs, rubs or gallops;  PMI nondisplaced Abdomen -NT/ND, no HSM,  no mass, + bowel sounds, no bruit 2+ femoral pulses, no bruits Ext-no edema, chords, 2+ DP Neuro-grossly nonfocal  ECG - personally reviewed  A/P  1 hypertension-  2 hyperlipidemia-  Olga Millers, MD

## 2021-12-28 ENCOUNTER — Ambulatory Visit (INDEPENDENT_AMBULATORY_CARE_PROVIDER_SITE_OTHER): Payer: Self-pay | Admitting: Podiatry

## 2021-12-28 DIAGNOSIS — Z91199 Patient's noncompliance with other medical treatment and regimen due to unspecified reason: Secondary | ICD-10-CM

## 2021-12-28 NOTE — Progress Notes (Signed)
Patient was no-show for appointment today 

## 2022-01-03 ENCOUNTER — Ambulatory Visit: Payer: Medicare Other | Attending: Cardiovascular Disease | Admitting: Cardiology

## 2022-01-25 ENCOUNTER — Encounter: Payer: Medicare Other | Admitting: Podiatry

## 2022-02-01 ENCOUNTER — Encounter: Payer: Self-pay | Admitting: Cardiovascular Disease

## 2022-02-01 ENCOUNTER — Ambulatory Visit: Payer: Medicare Other | Attending: Cardiovascular Disease | Admitting: Cardiovascular Disease

## 2022-02-01 VITALS — BP 189/84 | HR 82 | Ht 63.5 in | Wt 211.0 lb

## 2022-02-01 DIAGNOSIS — E118 Type 2 diabetes mellitus with unspecified complications: Secondary | ICD-10-CM | POA: Diagnosis not present

## 2022-02-01 DIAGNOSIS — N1831 Chronic kidney disease, stage 3a: Secondary | ICD-10-CM

## 2022-02-01 DIAGNOSIS — E7800 Pure hypercholesterolemia, unspecified: Secondary | ICD-10-CM

## 2022-02-01 DIAGNOSIS — I10 Essential (primary) hypertension: Secondary | ICD-10-CM

## 2022-02-01 DIAGNOSIS — R600 Localized edema: Secondary | ICD-10-CM | POA: Diagnosis not present

## 2022-02-01 DIAGNOSIS — Z794 Long term (current) use of insulin: Secondary | ICD-10-CM

## 2022-02-01 MED ORDER — CHLORTHALIDONE 15 MG PO TABS: 15.0000 mg | ORAL_TABLET | Freq: Every day | ORAL | 3 refills | Status: DC

## 2022-02-01 NOTE — Patient Instructions (Signed)
Medication Instructions:  START Chlorthalidone 15 mg once daily  *If you need a refill on your cardiac medications before your next appointment, please call your pharmacy*   Lab Work: Your provider would like for you to return in 2 weeks to have the following labs drawn: BMET. You do not need an appointment for the lab. Once in our office lobby there is a podium where you can sign in and ring the doorbell to alert [place] that you are here. The lab is open from 8:00 am to 4 pm; closed for lunch from 12:45pm-1:45pm.  You may also go to any of these [facility] locations:   [place] ([place]) - [place] [employer] [address]   If you have labs (blood work) drawn today and your tests are completely normal, you will receive your results only by: [employer] (if you have MyChart) OR A paper copy in the mail If you have any lab test that is abnormal or we need to change your treatment, we will call you to review the results.   Testing/Procedures: Your physician has requested that you have an echocardiogram. Echocardiography is a painless test that uses sound waves to create images of your heart. It provides your doctor with information about the size and shape of your heart and how well your heart's chambers and valves are working. You may receive an ultrasound enhancing agent through an IV if needed to better visualize your heart during the echo.This procedure takes approximately one hour. There are no restrictions for this procedure. This will take place at the [address].    Follow-Up: At [facility], you and your health needs are our priority.  As part of our continuing mission to provide you with exceptional heart care, we have created designated Provider Care Teams.  These Care Teams include your primary Cardiologist (physician) and Advanced Practice Providers (APPs -  Physician Assistants  and Nurse Practitioners) who all work together to provide you with the care you need, when you need it.  We recommend signing up for the patient portal called "MyChart".  Sign up information is provided on this After Visit Summary.  MyChart is used to connect with patients for Virtual Visits (Telemedicine).  Patients are able to view lab/test results, encounter notes, upcoming appointments, etc.  Non-urgent messages can be sent to your provider as well.   To learn more about what you can do with MyChart, go to [email or web address].    Your next appointment:   Follow up in 2 weeks with pharmd Follow up in 3 months with Dr. Sallyanne Kuster  Important Information About Sugar

## 2022-02-01 NOTE — Progress Notes (Signed)
Cardiology Office Note:    Date:  02/01/2022   ID:  Ann Gomez, DOB [birth year], MRN [id number]  PCP:  Delford Field, [place] Providers Cardiologist:  Sanda Klein, MD     Referring MD: Delford Field, FNP   No chief complaint on file. Ann Gomez is a 71 y.o. female who is being seen today for the evaluation of HTN at the request of Delford Field, FNP.   History of Present Illness:    Ann Gomez is a 71 y.o. female with a hx of type 2 diabetes mellitus, dyslipidemia, hypertension, CKD stage IIIa (baseline Creatinine 1.25) obesity, recent amputation of the right great toe, presenting for blood pressure management.  It appears that recently her blood pressure has been difficult to control.  She has been on numerous different medications over many years.  She reports doing well on amlodipine in the past and wonders why this was discontinued.  Hydrochlorothiazide is listed as an allergy, but she has been taking this medication until recently.  The only side effect of this medication that she can recall is that it made her dizzy many many years ago.  Metoprolol succinate 100 mg daily and Toprol tartrate 25 mg twice daily are both listed on her medication list, but she is not taking either one.  She is currently taking losartan 100 mg daily and recently hydralazine 25 mg 3 times daily was added, but her blood pressure remains elevated.    The patient specifically denies any headaches, chest pain at rest or with exertion, dyspnea at rest or with exertion, orthopnea, paroxysmal nocturnal dyspnea, syncope, palpitations, focal neurological deficits, intermittent claudication,  unexplained weight gain, cough, hemoptysis or wheezing.  She has bilateral ankle swelling, typically worse on the right side since her toe amputation.  She denies daytime hypersomnolence.  She is not sure if she is a loud snorer.  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  She has no difficulty doing the work or taking care of her own household chores from a cardiac symptom point of view.   Past Medical History:  Diagnosis Date   Diabetes mellitus    Edema    Essential hypertension, benign    Hyperlipidemia    Obesity, unspecified     Past Surgical History:  Procedure Laterality Date   AMPUTATION Right 11/20/2021   Procedure: AMPUTATION GREAT TOE;  Surgeon: Criselda Peaches, DPM;  Location: [facility];  Service: Podiatry;  Laterality: Right;   CATARACT EXTRACTION     CESAREAN SECTION     EYE SURGERY      Current Medications: Current Meds  Medication Sig   aspirin EC 81 MG tablet Take 1 tablet (81 mg total) by mouth daily.   chlorthalidone (HYGROTEN) 15 MG tablet Take 1 tablet (15 mg total) by mouth daily.   Cholecalciferol (VITAMIN D3 PO) Take 1 capsule by mouth daily.   dorzolamide-timolol (COSOPT) 22.3-6.8 MG/ML ophthalmic solution Place 1 drop into both eyes 2 (two) times daily.   Ginger, Zingiber officinalis, (GINGER ROOT PO) Take 550 mg by mouth daily.   glipiZIDE (GLUCOTROL) 10 MG tablet Take 10 mg by mouth in the morning and at bedtime.   hydrALAZINE (APRESOLINE) 25 MG tablet Take 25 mg by mouth 3 (three) times daily.   latanoprost (XALATAN) 0.005 % ophthalmic solution Place 1 drop into both eyes at bedtime.   LEVEMIR FLEXTOUCH 100 UNIT/ML FlexTouch Pen Inject 17  Units into the skin 2 (two) times daily.   losartan (COZAAR) 100 MG tablet Take 100 mg by mouth daily.   pravastatin (PRAVACHOL) 20 MG tablet Take 20 mg by mouth at bedtime.     Allergies:   Hydrochlorothiazide   Social History   Socioeconomic History   Marital status: Single    Spouse name: Not on file   Number of children: Not on file   Years of education: Not on file   Highest education level: Not on file  Occupational History   Not on file  Tobacco Use   Smoking status: Never   Smokeless tobacco: Never  Vaping Use   Vaping Use: Never used   Substance and Sexual Activity   Alcohol use: Yes    Comment: occasional   Drug use: No   Sexual activity: Not on file  Other Topics Concern   Not on file  Social History Narrative   Not on file   Social Determinants of Health   Financial Resource Strain: Not on file  Food Insecurity: Not on file  Transportation Needs: Not on file  Physical Activity: Not on file  Stress: Not on file  Social Connections: Not on file     Family History: The patient's Family history is unknown by patient.  ROS:   Please see the history of present illness.     All other systems reviewed and are negative.  EKGs/Labs/Other Studies Reviewed:    The following studies were reviewed today: Notes and labs from primary care provider.  EKG:  EKG is ordered today.  The ekg ordered today demonstrates sinus rhythm with PVCs, mild delayed R wave progression across anterior precordium, no repolarization abnormalities, QTc 448 ms  Recent Labs: 11/19/2021: Magnesium 1.4 12/12/2021: ALT 22; BUN 22; Creatinine, Ser 1.25; Hemoglobin 11.6; Platelets 174; Potassium 4.0; Sodium 139  Recent Lipid Panel    Component Value Date/Time   CHOL 148 07/08/2010 2320   TRIG 54 07/08/2010 2320   HDL 76 07/08/2010 2320   CHOLHDL 1.9 Ratio 07/08/2010 2320   VLDL 11 07/08/2010 2320   LDLCALC 61 07/08/2010 2320   11/25/2021 (care everywhere) Cholesterol 162, HDL 79, LDL 69, triglycerides 73  Risk Assessment/Calculations:      HYPERTENSION CONTROL Vitals:   02/01/22 0914 02/01/22 0937  BP: (!) 194/84 (!) 189/84    The patient's blood pressure is elevated above target today.  In order to address the patient's elevated BP: A new medication was prescribed today.            Physical Exam:    VS:  BP (!) 189/84   Pulse 82   Ht 5' 3.5" (1.613 m)   Wt 211 lb (95.7 kg)   SpO2 94%   BMI 36.79 kg/m     Wt Readings from Last 3 Encounters:  02/01/22 211 lb (95.7 kg)  11/20/21 190 lb 11.2 oz (86.5 kg)   08/16/21 197 lb (89.4 kg)     GEN: Severely obese, well nourished, well developed in no acute distress HEENT: Normal NECK: 8-9 cm JVD; No carotid bruits LYMPHATICS: No lymphadenopathy CARDIAC: RRR with frequent ectopy, no murmurs, rubs, gallops RESPIRATORY:  Clear to auscultation without rales, wheezing or rhonchi  ABDOMEN: Soft, non-tender, non-distended MUSCULOSKELETAL: 2+ pitting edema of the ankles and lower half of the pretibial area bilaterally. ; No deformity  SKIN: Warm and dry NEUROLOGIC:  Alert and oriented x 3 PSYCHIATRIC:  Normal affect   ASSESSMENT:    1. Essential hypertension  2. Bilateral lower extremity edema   3. Controlled type 2 diabetes mellitus with complication, with long-term current use of insulin (HCC)   4. Stage 3a chronic kidney disease (CKD) ([place])   5. Hypercholesterolemia    PLAN:    In order of problems listed above:  HTN: Not well controlled.  Not sure why there have been so many changes in her medications.  She is on maximum dose ARB and on a vasodilator.  Has clear evidence of hypervolemia.  We will add chlorthalidone.  Also has relatively frequent PVCs and may benefit from carvedilol, which we will add if she still has elevated blood pressure at her next appointment.  Ideally in the long run we will switch from 3 times daily hydralazine to a once daily vasodilator such as amlodipine, which she took in the past. Possible CHF: She has findings to suggest elevated right heart filling pressures with edema and jugular venous distention.  Note that her weight is currently 15-20 pounds higher than it was throughout most of the preceding year.  She denies any shortness of breath at rest or with activity.  Plan to check an echocardiogram to see if there are any signs of either right or left heart systolic or diastolic dysfunction. DM: Glycemic control is fair with a hemoglobin A1c of 7.8%, but not ideal considering the multiple complications she is  experienced, would prefer A1c less than 7%.  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  I do not see any evidence of vascular studies prior to her amputation.  Denies intermittent claudication.  Hard to palpate pedal pulses due to the edema. CKD3a: Appears to be at baseline creatinine level around 1.25. HLP: On statin therapy.  LDL less than 70 and an excellent HDL cholesterol           Medication Adjustments/Labs and Tests Ordered: Current medicines are reviewed at length with the patient today.  Concerns regarding medicines are outlined above.  Orders Placed This Encounter  Procedures   Basic metabolic panel   EKG 81-WEXH   ECHOCARDIOGRAM COMPLETE   Meds ordered this encounter  Medications   chlorthalidone (HYGROTEN) 15 MG tablet    Sig: Take 1 tablet (15 mg total) by mouth daily.    Dispense:  30 tablet    Refill:  3    Patient Instructions  Medication Instructions:  START Chlorthalidone 15 mg once daily  *If you need a refill on your cardiac medications before your next appointment, please call your pharmacy*   Lab Work: Your provider would like for you to return in 2 weeks to have the following labs drawn: BMET. You do not need an appointment for the lab. Once in our office lobby there is a podium where you can sign in and ring the doorbell to alert [place] that you are here. The lab is open from 8:00 am to 4 pm; closed for lunch from 12:45pm-1:45pm.  You may also go to any of these [facility] locations:   [place] ([place]) - [place] [address]   If you have labs (blood work) drawn today and your tests are completely normal, you will receive your results only by: [employer] (if you have MyChart) OR A paper copy in the mail If you have any lab test that is abnormal or we need to change your treatment, we  will  call you to review the results.   Testing/Procedures: Your physician has requested that you have an echocardiogram. Echocardiography is a painless test that uses sound waves to create images of your heart. It provides your doctor with information about the size and shape of your heart and how well your heart's chambers and valves are working. You may receive an ultrasound enhancing agent through an IV if needed to better visualize your heart during the echo.This procedure takes approximately one hour. There are no restrictions for this procedure. This will take place at the [address].    Follow-Up: At [facility], you and your health needs are our priority.  As part of our continuing mission to provide you with exceptional heart care, we have created designated Provider Care Teams.  These Care Teams include your primary Cardiologist (physician) and Advanced Practice Providers (APPs -  Physician Assistants and Nurse Practitioners) who all work together to provide you with the care you need, when you need it.  We recommend signing up for the patient portal called "MyChart".  Sign up information is provided on this After Visit Summary.  MyChart is used to connect with patients for Virtual Visits (Telemedicine).  Patients are able to view lab/test results, encounter notes, upcoming appointments, etc.  Non-urgent messages can be sent to your provider as well.   To learn more about what you can do with MyChart, go to [email or web address].    Your next appointment:   Follow up in 2 weeks with pharmd Follow up in 3 months with Dr. Sallyanne Kuster  Important Information About Sugar         Signed, Sanda Klein, MD  02/01/2022 11:07 AM    [place]

## 2022-02-03 ENCOUNTER — Telehealth: Payer: Self-pay | Admitting: *Deleted

## 2022-02-03 MED ORDER — CHLORTHALIDONE 25 MG PO TABS: 25.0000 mg | ORAL_TABLET | Freq: Every day | ORAL | 3 refills | Status: DC

## 2022-02-03 NOTE — Telephone Encounter (Signed)
The patient has been been made aware.

## 2022-02-03 NOTE — Telephone Encounter (Addendum)
Received a notification from [facility] that there was not a 15 mg of Chlorthalidone. This was prescribed for the patient at the last office visit.  Per Dr. Sallyanne Kuster, the patient can be on 25 mg once daily. A new script has been sent in.  Attempted to call the patient but the phone kept ringing. No voicemail was available.

## 2022-02-08 ENCOUNTER — Encounter: Payer: Medicare Other | Admitting: Podiatry

## 2022-02-08 DIAGNOSIS — I1310 Hypertensive heart and chronic kidney disease without heart failure, with stage 1 through stage 4 chronic kidney disease, or unspecified chronic kidney disease: Secondary | ICD-10-CM | POA: Insufficient documentation

## 2022-02-14 ENCOUNTER — Other Ambulatory Visit (HOSPITAL_COMMUNITY): Payer: Medicare Other

## 2022-02-14 ENCOUNTER — Encounter (HOSPITAL_COMMUNITY): Payer: Self-pay | Admitting: Cardiovascular Disease

## 2022-02-15 ENCOUNTER — Encounter (INDEPENDENT_AMBULATORY_CARE_PROVIDER_SITE_OTHER): Payer: Medicare Other | Admitting: Ophthalmology

## 2022-02-16 ENCOUNTER — Ambulatory Visit: Payer: Medicare Other | Attending: Cardiovascular Disease | Admitting: Cardiovascular Disease

## 2022-02-24 ENCOUNTER — Ambulatory Visit (INDEPENDENT_AMBULATORY_CARE_PROVIDER_SITE_OTHER): Payer: Medicare Other | Admitting: Podiatry

## 2022-02-24 DIAGNOSIS — Z91199 Patient's noncompliance with other medical treatment and regimen due to unspecified reason: Secondary | ICD-10-CM

## 2022-02-24 NOTE — Progress Notes (Signed)
Patient was no-show for appointment today 

## 2022-03-01 ENCOUNTER — Ambulatory Visit (INDEPENDENT_AMBULATORY_CARE_PROVIDER_SITE_OTHER): Payer: Medicare Other | Admitting: Podiatry

## 2022-03-01 ENCOUNTER — Ambulatory Visit (INDEPENDENT_AMBULATORY_CARE_PROVIDER_SITE_OTHER): Payer: Medicare Other

## 2022-03-01 DIAGNOSIS — B351 Tinea unguium: Secondary | ICD-10-CM | POA: Diagnosis not present

## 2022-03-01 DIAGNOSIS — E1142 Type 2 diabetes mellitus with diabetic polyneuropathy: Secondary | ICD-10-CM

## 2022-03-01 DIAGNOSIS — I96 Gangrene, not elsewhere classified: Secondary | ICD-10-CM

## 2022-03-01 DIAGNOSIS — L84 Corns and callosities: Secondary | ICD-10-CM

## 2022-03-01 DIAGNOSIS — M79675 Pain in left toe(s): Secondary | ICD-10-CM

## 2022-03-01 DIAGNOSIS — M79674 Pain in right toe(s): Secondary | ICD-10-CM | POA: Diagnosis not present

## 2022-03-01 MED ORDER — MUPIROCIN 2 % EX OINT: 1.0000 | TOPICAL_OINTMENT | Freq: Every day | CUTANEOUS | 0 refills | Status: DC

## 2022-03-02 NOTE — Progress Notes (Signed)
  Subjective:  Patient ID: Ann Gomez, female    DOB: May 19, 1950,  MRN: [id number]  Chief Complaint  Patient presents with   Diabetes    DOS 11/20/21 partial right great toe amputation    71 y.o. female presents with the above complaint. History confirmed with patient.  She returns after missing her last couple follow-ups.  She says she was unable to get here.  She says she noticed the third toe turning into a blister few days ago  Objective:  Physical Exam: warm, good capillary refill, palpable DP and PT pulses, and absent protective sensation, she has a well coalesced blood blister and early gangrenous changes of the third toe.  Thickened elongated nails x9 with subungual debris, there are hyperkeratoses on the bilateral fifth toes     Radiographs: Multiple views x-ray of the right foot: no soft tissue emphysema, no signs of osteomyelitis Assessment:   1. Gangrene of toe of right foot (HCC)   2. Type 2 diabetes mellitus with polyneuropathy (HCC)   3. Callus of foot   4. Pain due to onychomycosis of toenails of both feet      Plan:  Patient was evaluated and treated and all questions answered.  I discussed with her she does have gangrene of the right third toe.  Unclear if this is going to heal now or require amputation.  I recommend we continue to allow this to demarcate further.  For now may be WBAT in a surgical shoe, she no longer has one from surgery so a new 1 was dispensed today.  I recommend she apply mupirocin ointment daily to the area and leave open to air.  Rx mupirocin was sent to her pharmacy I do not think she has significant vascular compromise as her pulses are palpable and her amputation site of the hallux was well-healed.  1 staple was still present and was removed today   Discussed the etiology and treatment options for the condition in detail with the patient. Educated patient on the topical and oral treatment options for mycotic nails. Recommended  debridement of the nails today. Sharp and mechanical debridement performed of all painful and mycotic nails today. Nails debrided in length and thickness using a nail nipper to level of comfort. Discussed treatment options including appropriate shoe gear. Follow up as needed for painful nails.    All symptomatic hyperkeratoses were safely debrided with a sterile #15 blade to patient's level of comfort without incident. We discussed preventative and palliative care of these lesions including supportive and accommodative shoegear, padding, prefabricated and custom molded accommodative orthoses, use of a pumice stone and lotions/creams daily.   Return in about 3 months (around 06/01/2022) for at risk diabetic foot care.

## 2022-03-08 ENCOUNTER — Ambulatory Visit (HOSPITAL_COMMUNITY): Payer: Medicare Other | Attending: Internal Medicine

## 2022-03-08 DIAGNOSIS — R600 Localized edema: Secondary | ICD-10-CM | POA: Insufficient documentation

## 2022-03-08 DIAGNOSIS — I10 Essential (primary) hypertension: Secondary | ICD-10-CM | POA: Insufficient documentation

## 2022-03-08 LAB — ECHOCARDIOGRAM COMPLETE
Area-P 1/2: 4.89 cm2
S' Lateral: 3.1 cm

## 2022-04-14 ENCOUNTER — Ambulatory Visit: Payer: Medicare Other | Admitting: Podiatry

## 2022-04-14 DIAGNOSIS — I96 Gangrene, not elsewhere classified: Secondary | ICD-10-CM

## 2022-04-14 NOTE — Progress Notes (Signed)
  Subjective:  Patient ID: Ann Gomez, female    DOB: 1951/01/30,  MRN: [id number]  Chief Complaint  Patient presents with   Diabetic Ulcer    Follow up 3rd toe R Foot -gangrene    71 y.o. female presents with the above complaint. History confirmed with patient.  She says is doing much better Objective:  Physical Exam: warm, good capillary refill, palpable DP and PT pulses, and absent protective sensation, blood blister has healed and nail is somewhat loose on the third and fourth toes   Thickened elongated nails x9 with subungual debris, there are hyperkeratoses on the bilateral fifth toes     Radiographs: Multiple views x-ray of the right foot: no soft tissue emphysema, no signs of osteomyelitis Assessment:   1. Gangrene of toe of right foot (HCC)      Plan:  Patient was evaluated and treated and all questions answered.  What appeared to be gangrene of the right third toe now appears to have healed with a loss of the toenail from a hemorrhagic bulla.  Does not appear to be true gangrene at this point.  Hopefully can avoid any amputation.  I would like to see her back in 1 month for follow-up.  Continue using mupirocin daily   Return in about 1 month (around 05/15/2022) for wound care.

## 2022-05-12 ENCOUNTER — Other Ambulatory Visit: Payer: Self-pay | Admitting: Podiatry

## 2022-05-17 ENCOUNTER — Ambulatory Visit: Payer: Medicare Other | Admitting: Podiatry

## 2022-06-08 NOTE — Progress Notes (Addendum)
[facility] Note  06/14/2022     CHIEF COMPLAINT Patient presents for Retina Evaluation   HISTORY OF PRESENT ILLNESS: Ann Gomez is a 72 y.o. female who presents to the clinic today for:   HPI     Retina Evaluation   In both eyes.  I, the attending physician,  performed the HPI with the patient and updated documentation appropriately.        Comments   Patient here for retina evaluation. Referred by Dr Katy Fitch. Patient states vision doing good. No eye pain. Using drops.       Last edited by Bernarda Caffey, MD on 06/14/2022 12:41 PM.    Pt is here on the referral of Dr. Elliot Dally for concern of macular edema, pt previously saw Dr. Zadie Rhine and received IVA OU, pt has not seen him since June 2023, pt had a vitrectomy OS with Dr. Zadie Rhine  Referring physician: Clent Jacks, MD [address] [place],  [place] [address]  HISTORICAL INFORMATION:   Selected notes from the MEDICAL RECORD NUMBER Dr. Zadie Rhine pt here due to insurance reasons LEE:  Ocular Hx- PMH-    CURRENT MEDICATIONS: Current Outpatient Medications (Ophthalmic Drugs)  Medication Sig   dorzolamide-timolol (COSOPT) 22.3-6.8 MG/ML ophthalmic solution Place 1 drop into both eyes 2 (two) times daily.   Latanoprostene Bunod (VYZULTA) 0.024 % SOLN Place 1 drop into both eyes at bedtime. QHS OU   latanoprost (XALATAN) 0.005 % ophthalmic solution Place 1 drop into both eyes at bedtime. (Patient not taking: Reported on 06/14/2022)   No current facility-administered medications for this visit. (Ophthalmic Drugs)   Current Outpatient Medications (Other)  Medication Sig   aspirin EC 81 MG tablet Take 1 tablet (81 mg total) by mouth daily.   Cholecalciferol (VITAMIN D3 PO) Take 1 capsule by mouth daily.   Ginger, Zingiber officinalis, (GINGER ROOT PO) Take 550 mg by mouth daily.   glipiZIDE (GLUCOTROL) 10 MG tablet Take 10 mg by mouth in the morning and at bedtime.   hydrALAZINE (APRESOLINE)  25 MG tablet Take 25 mg by mouth 3 (three) times daily.   LEVEMIR FLEXTOUCH 100 UNIT/ML FlexTouch Pen Inject 17 Units into the skin 2 (two) times daily.   losartan (COZAAR) 100 MG tablet Take 100 mg by mouth daily.   mupirocin ointment (BACTROBAN) 2 % APPLY 1 APPLICATION TOPICALLY DAILY   pravastatin (PRAVACHOL) 20 MG tablet Take 20 mg by mouth at bedtime.   chlorthalidone (HYGROTON) 25 MG tablet Take 1 tablet (25 mg total) by mouth daily.   No current facility-administered medications for this visit. (Other)   REVIEW OF SYSTEMS: ROS   Positive for: Endocrine, Cardiovascular, Eyes Negative for: Constitutional, Gastrointestinal, Neurological, Skin, Genitourinary, Musculoskeletal, HENT, Respiratory, Psychiatric, Allergic/Imm, Heme/Lymph Last edited by Theodore Demark, COA on 06/14/2022  9:49 AM.     ALLERGIES Allergies  Allergen Reactions   Hydrochlorothiazide Other (See Comments)    Dizziness   PAST MEDICAL HISTORY Past Medical History:  Diagnosis Date   Diabetes mellitus    Edema    Essential hypertension, benign    Hyperlipidemia    Obesity, unspecified    Past Surgical History:  Procedure Laterality Date   AMPUTATION Right 11/20/2021   Procedure: AMPUTATION GREAT TOE;  Surgeon: Criselda Peaches, DPM;  Location: [facility];  Service: Podiatry;  Laterality: Right;   CATARACT EXTRACTION     CESAREAN SECTION     EYE SURGERY  FAMILY HISTORY Family History  Family history unknown: Yes   SOCIAL HISTORY Social History   Tobacco Use   Smoking status: Never   Smokeless tobacco: Never  Vaping Use   Vaping Use: Never used  Substance Use Topics   Alcohol use: Yes    Comment: occasional   Drug use: No       OPHTHALMIC EXAM:  Base Eye Exam     Visual Acuity (Snellen - Linear)       Right Left   Dist [place] 20/150 -1 20/40 +2   Dist ph [place] NI NI         Tonometry (Tonopen, 9:45 AM)       Right Left   Pressure 22 18         Pupils       Dark Light Shape  React APD   Right 3 2 Round Brisk None   Left 3 2 Round Brisk None         Visual Fields (Counting fingers)       Left Right    Full Full         Extraocular Movement       Right Left    Full, Ortho Full, Ortho         Neuro/Psych     Oriented x3: Yes   Mood/Affect: Normal         Dilation     Both eyes: 1.0% Mydriacyl, 2.5% Phenylephrine @ 9:45 AM           Slit Lamp and Fundus Exam     Slit Lamp Exam       Right Left   Lids/Lashes Dermatochalasis - upper lid Dermatochalasis - upper lid   Conjunctiva/Sclera Melanosis Melanosis   Cornea 3+ Punctate epithelial erosions, well healed cataract wound arcus, trace PEE, mild endo pigment, well healed cataract wound   Anterior Chamber deep, clear, narrow angles deep, clear, narrow angles   Iris Round and dilated Round and dilated   Lens PC IOL in good position with open PC PC IOL in good position   Anterior Vitreous mild syneresis post vitrectomy         Fundus Exam       Right Left   Disc mild Pallor, Sharp rim 2+ mild Pallor, Sharp rim   C/D Ratio 0.4 0.5   Macula Flat, Blunted foveal reflex, scattered IRH/DBH and exudates, central edema, scattered pre-retinal heme Flat, Blunted foveal reflex, scattered MA/DBH greatest centrally, central cystic changes / edema, dense laser temporal macula   Vessels attenuated, Tortuous attenuated, Tortuous   Periphery Attached, scattered MA/DBH and exudates Attached, scattered MA / DBH, scattered patches of PRP with room for fill in           Refraction     Manifest Refraction       Sphere Cylinder Dist [place]   Right [place]  20/150   Left -0.75 Sphere 20/40           IMAGING AND PROCEDURES  Imaging and Procedures for 06/14/2022  OCT, Retina - OU - Both Eyes       Right Eye Quality was good. Scan locations included subfoveal. Central Foveal Thickness: 428. Progression has no prior data. Findings include abnormal foveal contour, intraretinal  hyper-reflective material, intraretinal fluid, inner retinal atrophy, vitreomacular adhesion (Central CME).   Left Eye Quality was good. Scan locations included subfoveal. Central Foveal Thickness: 425. Progression has no prior data. Findings include abnormal foveal contour, subretinal hyper-reflective  material, epiretinal membrane, intraretinal fluid, subretinal fluid.   Notes *Images captured and stored on drive  Diagnosis / Impression:  OD: CRVO with central CME OS: +DME, mild ERM  Clinical management:  See below  Abbreviations: NFP - Normal foveal profile. CME - cystoid macular edema. PED - pigment epithelial detachment. IRF - intraretinal fluid. SRF - subretinal fluid. EZ - ellipsoid zone. ERM - epiretinal membrane. ORA - outer retinal atrophy. ORT - outer retinal tubulation. SRHM - subretinal hyper-reflective material. IRHM - intraretinal hyper-reflective material      Fluorescein Angiography Optos (Transit OD)       Right Eye Progression has no prior data. Early phase findings include blockage, delayed filling, microaneurysm, neovascularization disc, vascular perfusion defect. Mid/Late phase findings include blockage, leakage, microaneurysm, neovascularization disc, vascular perfusion defect.   Left Eye Progression has no prior data. Early phase findings include staining, microaneurysm. Mid/Late phase findings include leakage, staining, microaneurysm (Dense cluster of leaking MA's ST mac).   Notes **Images stored on drive**  Impression: PDR OU OD: fine NVD; scattered patches of vascular nonperfusion; enlarged FAZ; diffuse leaking MA OS: Dense cluster of leaking MA's ST mac; staining of PRP laser 360; no active NV     Intravitreal Injection, Pharmacologic Agent - OD - Right Eye       Time Out 06/14/2022. 11:34 AM. Confirmed correct patient, procedure, site, and patient consented.   Anesthesia Topical anesthesia was used. Anesthetic medications included Lidocaine 4%,  Tetracaine 0.5%.   Procedure Preparation included 5% betadine to ocular surface, eyelid speculum.   Injection: 1.25 mg Bevacizumab 1.63m/0.05ml   Route: Intravitreal, Site: Right Eye   NDC: 50242-060-01, Lot:[id number] Expiration date: 08/07/2022   Post-op Post injection exam found visual acuity of at least counting fingers. The patient tolerated the procedure well. There were no complications. The patient received written and verbal post procedure care education. Post injection medications were not given.      Intravitreal Injection, Pharmacologic Agent - OS - Left Eye       Time Out 06/14/2022. 11:35 AM. Confirmed correct patient, procedure, site, and patient consented.   Anesthesia Topical anesthesia was used. Anesthetic medications included Lidocaine 2%, Proparacaine 0.5%.   Procedure Preparation included 5% betadine to ocular surface, eyelid speculum. A supplied needle was used.   Injection: 1.25 mg Bevacizumab 1.258m0.05ml   Route: Intravitreal, Site: Left Eye   NDC: [id number]: [id number]$ , Expiration date: 07/08/2022   Post-op Post injection exam found visual acuity of at least counting fingers. The patient tolerated the procedure well. There were no complications. The patient received written and verbal post procedure care education.            ASSESSMENT/PLAN:    ICD-10-CM   1. Proliferative diabetic retinopathy of both eyes with macular edema associated with type 2 diabetes mellitus (HCC)  E11.3513 OCT, Retina - OU - Both Eyes    Fluorescein Angiography Optos (Transit OD)    Intravitreal Injection, Pharmacologic Agent - OD - Right Eye    Intravitreal Injection, Pharmacologic Agent - OS - Left Eye    Bevacizumab (AVASTIN) SOLN 1.25 mg    Bevacizumab (AVASTIN) SOLN 1.25 mg    2. Central retinal vein occlusion with macular edema of right eye  H34.8110 OCT, Retina - OU - Both Eyes    3. History of vitrectomy  Z98.890     4. Essential hypertension  I10      5. Hypertensive retinopathy of both eyes  H35.033 Fluorescein Angiography Optos (Transit OD)  6. Pseudophakia, both eyes  Z96.1      *Pt previously managed by Dr. Zadie Rhine, transferring here due to insurance  - history of PDR OU  - OD -- history of CRVO w/ CME  - OS -- history of PPV w/ membrane peel and laser for PDR ([facility] - 06.28.23)  - s/p IVA OU, last injections June 2023  1. CRVO w/ CME, OD  - history of multiple IVA OD w/ [facility] (last 06.06.23 -- )  - The natural history of retinal vein occlusion and macular edema and treatment options including observation, laser photocoagulation, and intravitreal antiVEGF injection with Avastin, Lucentis, Eylea, Vabysmo and intravitreal injection of steroids with triamcinolone and Ozurdex and the complications of these procedures including loss of vision, infection, cataract, glaucoma, and retinal detachment were discussed with patient.  - Specifically discussed stabilization with anti-VEGF agents and increased potential for visual improvements.  Also discussed need for frequent follow up and potentially multiple injections given the chronic nature of the disease process  - BCVA OD 20/150  - OCT shows severe central CME at 8 mos since last IVA  - recommend IVA OD #1 today 02.20.24 w/ f/u in 4 wks  - RBA of procedure discussed, questions answered - informed consent obtained and signed  - see procedure note - f/u 4 wks -- DFE/OCT, possible injection  2,3. Proliferative diabetic retinopathy, both eyes  - history of PPV w/ membrane peel and laser OS for PDR ([facility] - 06.28.23) - The incidence, risk factors for progression, natural history and treatment options for diabetic retinopathy were discussed with patient.   - The need for close monitoring of blood glucose, blood pressure, and serum lipids, avoiding cigarette or any type of tobacco, and the need for long term follow up was also discussed with patient. - exam shows scattered MA, DBH and  preretinal heme OU; OS with PRP 360 - FA (02.20.24) shows OD: fine NVD; scattered patches of vascular nonperfusion; enlarged FAZ; diffuse leaking MA; OS: Dense cluster of leaking MA's ST mac; staining of PRP laser 360; no active NV - OCT shows diabetic macular edema, both eyes  - The natural history, pathology, and characteristics of diabetic macular edema discussed with patient.  A generalized discussion of the major clinical trials concerning treatment of diabetic macular edema (ETDRS, DCT, SCORE, RISE / RIDE, and ongoing DRCR net studies) was completed.  This discussion included mention of the various approaches to treating diabetic macular edema (observation, laser photocoagulation, anti-VEGF injections with lucentis / Avastin / Eylea, steroid injections with Kenalog / Ozurdex, and intraocular surgery with vitrectomy).  The goal hemoglobin A1C of 6-7 was discussed, as well as importance of smoking cessation and hypertension control.  Need for ongoing treatment and monitoring were specifically discussed with reference to chronic nature of diabetic macular edema. - recommend IVA OU #1 today, 02.20.24 for DME - pt wishes to proceed with injections - RBA of procedure discussed, questions answered - informed consent obtained and signed - see procedure note - f/u in 4 wks -- DFE/OCT, possible injection  4,5. Hypertensive retinopathy OU - discussed importance of tight BP control - monitor  6. Pseudophakia OU  - s/p CE/IOL OU  - IOL in good position, doing well  - monitor  Ophthalmic Meds Ordered this visit:  Meds ordered this encounter  Medications   Bevacizumab (AVASTIN) SOLN 1.25 mg   Bevacizumab (AVASTIN) SOLN 1.25 mg     Return in about 4 weeks (around 07/12/2022) for f/u CRVO OD /  PDR OU, DFE, OCT.  There are no Patient Instructions on file for this visit.   Explained the diagnoses, plan, and follow up with the patient and they expressed understanding.  Patient expressed  understanding of the importance of proper follow up care.   This document serves as a record of services personally performed by Gardiner Sleeper, MD, PhD. It was created on their behalf by Renaldo Reel, [place] an ophthalmic technician. The creation of this record is the provider's dictation and/or activities during the visit.    Electronically signed by:  Renaldo Reel, COT  02.14.24 12:41 PM  This document serves as a record of services personally performed by Gardiner Sleeper, MD, PhD. It was created on their behalf by [place] Jetty. Owens Shark, OA an ophthalmic technician. The creation of this record is the provider's dictation and/or activities during the visit.    Electronically signed by: San Jetty. Owens Shark, [place] 02.20.2024 12:41 PM  Gardiner Sleeper, M.D., Ph.D. Diseases & Surgery of the Retina and Vitreous [facility]  I have reviewed the above documentation for accuracy and completeness, and I agree with the above. Gardiner Sleeper, M.D., Ph.D. 06/14/22 12:50 PM   Abbreviations: M myopia (nearsighted); A astigmatism; H hyperopia (farsighted); P presbyopia; Mrx spectacle prescription;  CTL contact lenses; OD right eye; OS left eye; OU both eyes  XT exotropia; ET esotropia; PEK punctate epithelial keratitis; PEE punctate epithelial erosions; DES dry eye syndrome; MGD meibomian gland dysfunction; ATs artificial tears; PFAT's preservative free artificial tears; [place] nuclear sclerotic cataract; PSC posterior subcapsular cataract; ERM epi-retinal membrane; PVD posterior vitreous detachment; RD retinal detachment; DM diabetes mellitus; DR diabetic retinopathy; NPDR non-proliferative diabetic retinopathy; PDR proliferative diabetic retinopathy; CSME clinically significant macular edema; DME diabetic macular edema; dbh dot blot hemorrhages; CWS cotton wool spot; POAG primary open angle glaucoma; C/D cup-to-disc ratio; HVF humphrey visual field; GVF goldmann visual field; OCT optical  coherence tomography; IOP intraocular pressure; BRVO Branch retinal vein occlusion; CRVO central retinal vein occlusion; CRAO central retinal artery occlusion; BRAO branch retinal artery occlusion; RT retinal tear; SB scleral buckle; PPV pars plana vitrectomy; VH Vitreous hemorrhage; PRP panretinal laser photocoagulation; IVK intravitreal kenalog; VMT vitreomacular traction; MH Macular hole;  NVD neovascularization of the disc; NVE neovascularization elsewhere; AREDS age related eye disease study; ARMD age related macular degeneration; POAG primary open angle glaucoma; EBMD epithelial/anterior basement membrane dystrophy; ACIOL anterior chamber intraocular lens; IOL intraocular lens; PCIOL posterior chamber intraocular lens; Phaco/IOL phacoemulsification with intraocular lens placement; [place] photorefractive keratectomy; LASIK laser assisted in situ keratomileusis; HTN hypertension; DM diabetes mellitus; COPD chronic obstructive pulmonary disease

## 2022-06-10 ENCOUNTER — Encounter (INDEPENDENT_AMBULATORY_CARE_PROVIDER_SITE_OTHER): Payer: Medicare Other | Admitting: Ophthalmology

## 2022-06-14 ENCOUNTER — Ambulatory Visit (INDEPENDENT_AMBULATORY_CARE_PROVIDER_SITE_OTHER): Payer: Medicare Other | Admitting: Ophthalmology

## 2022-06-14 ENCOUNTER — Encounter (INDEPENDENT_AMBULATORY_CARE_PROVIDER_SITE_OTHER): Payer: Self-pay | Admitting: Ophthalmology

## 2022-06-14 DIAGNOSIS — E113513 Type 2 diabetes mellitus with proliferative diabetic retinopathy with macular edema, bilateral: Secondary | ICD-10-CM | POA: Diagnosis not present

## 2022-06-14 DIAGNOSIS — H35033 Hypertensive retinopathy, bilateral: Secondary | ICD-10-CM | POA: Diagnosis not present

## 2022-06-14 DIAGNOSIS — H34811 Central retinal vein occlusion, right eye, with macular edema: Secondary | ICD-10-CM | POA: Diagnosis not present

## 2022-06-14 DIAGNOSIS — I10 Essential (primary) hypertension: Secondary | ICD-10-CM

## 2022-06-14 DIAGNOSIS — Z9889 Other specified postprocedural states: Secondary | ICD-10-CM

## 2022-06-14 DIAGNOSIS — Z961 Presence of intraocular lens: Secondary | ICD-10-CM

## 2022-06-14 MED ORDER — BEVACIZUMAB CHEMO INJECTION 1.25MG/0.05ML SYRINGE FOR KALEIDOSCOPE
1.2500 mg | INTRAVITREAL | Status: AC | PRN
  Administered 2022-06-14: 1.25 mg via INTRAVITREAL

## 2022-06-21 ENCOUNTER — Encounter: Payer: Self-pay | Admitting: Podiatry

## 2022-06-21 ENCOUNTER — Ambulatory Visit (INDEPENDENT_AMBULATORY_CARE_PROVIDER_SITE_OTHER): Payer: Medicare Other | Admitting: Podiatry

## 2022-06-21 VITALS — BP 206/88

## 2022-06-21 DIAGNOSIS — E119 Type 2 diabetes mellitus without complications: Secondary | ICD-10-CM

## 2022-06-21 DIAGNOSIS — L84 Corns and callosities: Secondary | ICD-10-CM

## 2022-06-21 DIAGNOSIS — M79674 Pain in right toe(s): Secondary | ICD-10-CM

## 2022-06-21 DIAGNOSIS — M79675 Pain in left toe(s): Secondary | ICD-10-CM | POA: Diagnosis not present

## 2022-06-21 DIAGNOSIS — M2042 Other hammer toe(s) (acquired), left foot: Secondary | ICD-10-CM | POA: Diagnosis not present

## 2022-06-21 DIAGNOSIS — Z89421 Acquired absence of other right toe(s): Secondary | ICD-10-CM | POA: Diagnosis not present

## 2022-06-21 DIAGNOSIS — B351 Tinea unguium: Secondary | ICD-10-CM

## 2022-06-21 DIAGNOSIS — E1142 Type 2 diabetes mellitus with diabetic polyneuropathy: Secondary | ICD-10-CM

## 2022-06-21 DIAGNOSIS — M2041 Other hammer toe(s) (acquired), right foot: Secondary | ICD-10-CM | POA: Diagnosis not present

## 2022-06-21 NOTE — Progress Notes (Signed)
ANNUAL DIABETIC FOOT EXAM  Subjective: Ann Gomez presents today for annual diabetic foot examination.  Chief Complaint  Patient presents with   Nail Problem    DFC BS-did not check today A1C-do not know [facility]  PCP VST-04/2022   Patient confirms h/o diabetes.  Patient relates 25 year h/o diabetes.  Patient has h/o amputation(s): partial amputation of right great toe.  Patient has been diagnosed with neuropathy.  Risk factors: diabetes, HTN, CKD, hyperlipidemia.  Dot Been, FNP is patient's PCP.   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) 11/25/2021   Hypomagnesemia 11/23/2021   Vitamin D deficiency 11/23/2021   Hyponatremia 11/21/2021   Stage 3a chronic kidney disease (CKD) (HCC) 11/19/2021   Toe osteomyelitis, right (HCC) 11/18/2021   Primary open angle glaucoma of both eyes, mild stage 02/17/2021   Irregular heart beat 11/11/2020   Central retinal vein occlusion with macular edema of right eye 10/05/2020   Grade 2 hypertensive retinopathy, right 09/17/2020   Closed fracture of proximal end of right humerus 03/30/2020   Vitreomacular adhesion of left eye 02/13/2020   Macular pucker, left eye 02/13/2020   Diabetic macular edema of right eye with proliferative retinopathy associated with type 2 diabetes mellitus (HCC) 01/08/2020   Proliferative diabetic retinopathy of left eye with macular edema associated with type 2 diabetes mellitus (HCC) 01/08/2020   Primary open angle glaucoma of both eyes, moderate stage 01/08/2020   Essential hypertension, benign    Edema    Type 2 diabetes mellitus with polyneuropathy (HCC) 10/09/2006    Hyperlipidemia 10/09/2006   OBESITY NOS 10/09/2006   ANEMIA, IRON DEFICIENCY NOS 10/09/2006   BELL'S PALSY 10/09/2006   Essential hypertension 10/09/2006   Past Surgical History:  Procedure Laterality Date   AMPUTATION Right 11/20/2021   Procedure: AMPUTATION GREAT TOE;  Surgeon: Edwin Cap, DPM;  Location: [facility];  Service: Podiatry;  Laterality: Right;   CATARACT EXTRACTION     CESAREAN SECTION     EYE SURGERY     Current Outpatient Medications on File Prior to Visit  Medication Sig Dispense Refill   aspirin EC 81 MG tablet Take 1 tablet (81 mg total) by mouth daily. 30 tablet 3   chlorthalidone (HYGROTON) 25 MG tablet Take 1 tablet (25 mg total) by mouth daily. 30 tablet 3   Cholecalciferol (VITAMIN D3 PO) Take 1 capsule by mouth daily.     dorzolamide-timolol (COSOPT) 22.3-6.8 MG/ML ophthalmic solution Place 1 drop into both eyes 2 (two) times daily.     Ginger, Zingiber officinalis, (GINGER ROOT PO) Take 550 mg by mouth daily.     glipiZIDE (GLUCOTROL) 10 MG tablet Take 10 mg by mouth in the morning and at bedtime.     hydrALAZINE (APRESOLINE) 25 MG tablet Take 25 mg by mouth 3 (three) times daily.     latanoprost (XALATAN) 0.005 % ophthalmic solution Place 1 drop into both eyes at bedtime. (Patient not taking: Reported on 06/14/2022)     Latanoprostene Bunod (VYZULTA) 0.024 % SOLN Place 1 drop into both eyes at bedtime. QHS OU     LEVEMIR FLEXTOUCH 100 UNIT/ML FlexTouch  Pen Inject 17 Units into the skin 2 (two) times daily.     losartan (COZAAR) 100 MG tablet Take 100 mg by mouth daily.     mupirocin ointment (BACTROBAN) 2 % APPLY 1 APPLICATION TOPICALLY DAILY 22 g 0   pravastatin (PRAVACHOL) 20 MG tablet Take 20 mg by mouth at bedtime.     [DISCONTINUED] metoprolol succinate (TOPROL-XL) 100 MG 24 hr tablet Take 100 mg by mouth daily. Take with or immediately following a meal.     [DISCONTINUED] rosuvastatin (CRESTOR) 5 MG tablet Take 5 mg by mouth daily.     No current  facility-administered medications on file prior to visit.    Allergies  Allergen Reactions   Hydrochlorothiazide Other (See Comments)    Dizziness   Social History   Occupational History   Not on file  Tobacco Use   Smoking status: Never   Smokeless tobacco: Never  Vaping Use   Vaping Use: Never used  Substance and Sexual Activity   Alcohol use: Yes    Comment: occasional   Drug use: No   Sexual activity: Not on file   Family History  Family history unknown: Yes   Immunization History  Administered Date(s) Administered   Influenza Whole 03/22/2006   Pneumococcal Polysaccharide-23 08/04/2004   Td 11/25/1996     Review of Systems: Negative except as noted in the HPI.   Objective: Vitals:   06/21/22 0909  BP: (!) 206/88   Ann Gomez is a pleasant 72 y.o. female in NAD. AAO X 3.  Vascular Examination: CFT <4 seconds b/l. DP palpable b/l. PT pulses faintly palpable b/l. Skin temperature gradient warm to warm b/l. No pain with calf compression. No ischemia or gangrene. No cyanosis or clubbing noted b/l. Pedal hair absent.   Neurological Examination: Pt has subjective symptoms of neuropathy. Protective sensation intact 5/5 intact bilaterally with 10g monofilament b/l. Vibratory sensation intact b/l. Proprioception intact bilaterally. Lower extremity deep tendon reflexes WNL b/l.   Dermatological Examination: Pedal skin warm and supple b/l.   No open wounds. No interdigital macerations.  Toenails left hallux and 2-5 b/l thick, discolored, elongated with subungual debris and pain on dorsal palpation.    Hyperkeratotic lesion(s) bilateral 5th toes.  No erythema, no edema, no drainage, no fluctuance.  Musculoskeletal Examination: Muscle strength 5/5 to b/l LE. No gross bony deformities bilaterally. Lower extremity amputation(s): partial amputation of R hallux.  Radiographs: None  Footwear Assessment: Does the patient wear appropriate shoes? Yes. Does the  patient need inserts/orthotics? Yes.  Lab Results  Component Value Date   HGBA1C 7.8 (H) 11/19/2021   ADA Risk Categorization: High Risk  Patient has one or more of the following: Loss of protective sensation Absent pedal pulses Severe Foot deformity History of foot ulcer  Assessment: 1. Pain due to onychomycosis of toenails of both feet   2. Callus of foot   3. 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 Acquired hammertoes of both feet   5. Type 2 diabetes mellitus with polyneuropathy (HCC)   6. Encounter for diabetic foot exam [facility])     Plan: -Patient was evaluated and treated. All patient's and/or POA's questions/concerns answered on today's visit. -Diabetic foot examination performed today. -Discussed and educated patient on diabetic foot care, especially with  regards to the vascular, neurological and musculoskeletal systems. -Patient/POA educated on dangers of using sharp instrumentation on toes/feet. Recommended continued professional foot care in presence of diabetes and neuropathy. Patient/POA  relates understanding. -Discussed diabetic shoe benefit available based on patient's diagnoses. Patient/POA would like to proceed. Order entered for one pair extra depth shoes and 3 pair custom molded insoles. Patient qualifies based on diagnoses. -Toenails were debrided in length and girth 2-5 bilaterally and left great toe with sterile nail nippers and dremel without iatrogenic bleeding.  -Corn(s) bilateral 5th toes pared utilizing sharp debridement with sterile blade without complication or incident. Total number debrided=2. -Patient/POA to call should there be question/concern in the interim. Return in about 10 weeks (around 08/30/2022).  Freddie Breech, DPM

## 2022-06-28 ENCOUNTER — Encounter: Payer: Self-pay | Admitting: Physician Assistant

## 2022-06-28 ENCOUNTER — Ambulatory Visit: Payer: Medicare HMO | Attending: Physician Assistant | Admitting: Physician Assistant

## 2022-06-28 VITALS — BP 164/80 | HR 68 | Ht 65.5 in | Wt 197.4 lb

## 2022-06-28 DIAGNOSIS — E119 Type 2 diabetes mellitus without complications: Secondary | ICD-10-CM

## 2022-06-28 DIAGNOSIS — I10 Essential (primary) hypertension: Secondary | ICD-10-CM

## 2022-06-28 MED ORDER — AMLODIPINE BESYLATE 5 MG PO TABS: 5.0000 mg | ORAL_TABLET | Freq: Every day | ORAL | 3 refills | Status: DC

## 2022-06-28 NOTE — Progress Notes (Unsigned)
Cardiology Office Note:    Date:  06/30/2022   ID:  [name], DOB [birth year], MRN [id number]  PCP:  Joycelyn Man, [place] [facility] Cardiologist:  Sanda Klein, MD     Referring MD: Joycelyn Man, FNP   Chief Complaint  Patient presents with   Follow-up    Seen for Dr. Sallyanne Kuster    History of Present Illness:    Ann Gomez is a 72 y.o. female with a hx of HTN, HLD, DM II, CKD stage III, obesity, amputation of the right great toe.  She has been followed by Dr. Sallyanne Kuster for blood pressure management.  She has multiple allergies including hydrochlorothiazide.  Although Toprol-XL was previously listed, she was not taking it.  She was seen by Dr. Sallyanne Kuster in October 2023, chlorthalidone 25 mg was added to her medical regimen.  Subsequent echocardiogram obtained on 03/08/2022 demonstrated EF 60 to 65%, grade 1 DD, mildly elevated pulmonary artery systolic pressure, mild LAE, mild MR.  Patient presents today for follow-up, her blood pressure remain elevated.  I recommend the addition of amlodipine 5 mg daily to her medical regimen.  She will need to follow-up with our hypertension clinic in a few weeks.  Otherwise, she denies any specific chest pain, shortness of breath or lower extremity edema.   Past Medical History:  Diagnosis Date   Diabetes mellitus    Edema    Essential hypertension, benign    Hyperlipidemia    Obesity, unspecified     Past Surgical History:  Procedure Laterality Date   AMPUTATION Right 11/20/2021   Procedure: AMPUTATION GREAT TOE;  Surgeon: Criselda Peaches, DPM;  Location: [facility];  Service: Podiatry;  Laterality: Right;   CATARACT EXTRACTION     CESAREAN SECTION     EYE SURGERY      Current Medications: Current Meds  Medication Sig   amLODipine (NORVASC) 5 MG tablet Take 1 tablet (5 mg total) by mouth daily.   aspirin EC 81 MG tablet Take 1 tablet (81 mg total) by mouth daily.   chlorthalidone (HYGROTON) 25 MG tablet  Take 1 tablet (25 mg total) by mouth daily.   Cholecalciferol (VITAMIN D3 PO) Take 1 capsule by mouth daily.   dorzolamide-timolol (COSOPT) 22.3-6.8 MG/ML ophthalmic solution Place 1 drop into both eyes 2 (two) times daily.   empagliflozin (JARDIANCE) 10 MG TABS tablet Take 10 mg by mouth daily.   Ginger, Zingiber officinalis, (GINGER ROOT PO) Take 550 mg by mouth daily.   glipiZIDE (GLUCOTROL) 10 MG tablet Take 10 mg by mouth in the morning and at bedtime.   hydrALAZINE (APRESOLINE) 25 MG tablet Take 25 mg by mouth 3 (three) times daily.   latanoprost (XALATAN) 0.005 % ophthalmic solution Place 1 drop into both eyes at bedtime.   Latanoprostene Bunod (VYZULTA) 0.024 % SOLN Place 1 drop into both eyes at bedtime. QHS OU   LEVEMIR FLEXTOUCH 100 UNIT/ML FlexTouch Pen Inject 17 Units into the skin 2 (two) times daily.   losartan (COZAAR) 100 MG tablet Take 100 mg by mouth daily.   mupirocin ointment (BACTROBAN) 2 % APPLY 1 APPLICATION TOPICALLY DAILY   pravastatin (PRAVACHOL) 20 MG tablet Take 20 mg by mouth at bedtime.   [DISCONTINUED] hydrochlorothiazide (HYDRODIURIL) 25 MG tablet Take 25 mg by mouth daily.     Allergies:   Hydrochlorothiazide   Social History   Socioeconomic History   Marital status: Single    Spouse name: Not  on file   Number of children: Not on file   Years of education: Not on file   Highest education level: Not on file  Occupational History   Not on file  Tobacco Use   Smoking status: Never   Smokeless tobacco: Never  Vaping Use   Vaping Use: Never used  Substance and Sexual Activity   Alcohol use: Yes    Comment: occasional   Drug use: No   Sexual activity: Not on file  Other Topics Concern   Not on file  Social History Narrative   Not on file   Social Determinants of Health   Financial Resource Strain: Not on file  Food Insecurity: Not on file  Transportation Needs: Not on file  Physical Activity: Not on file  Stress: Not on file  Social  Connections: Not on file     Family History: The patient's Family history is unknown by patient.  ROS:   Please see the history of present illness.     All other systems reviewed and are negative.  EKGs/Labs/Other Studies Reviewed:    The following studies were reviewed today:  Echo 03/08/2022 1. Left ventricular ejection fraction, by estimation, is 60 to 65%. The  left ventricle has normal function. The left ventricle has no regional  wall motion abnormalities. Left ventricular diastolic parameters are  consistent with Grade I diastolic  dysfunction (impaired relaxation).   2. Right ventricular systolic function is normal. The right ventricular  size is normal. There is mildly elevated pulmonary artery systolic  pressure.   3. Left atrial size was mildly dilated.   4. Mild mitral valve regurgitation.   5. The aortic valve is tricuspid. Aortic valve regurgitation is not  visualized.   6. The inferior vena cava is normal in size with greater than 50%  respiratory variability, suggesting right atrial pressure of 3 mmHg.   Comparison(s): No prior Echocardiogram.   EKG:  EKG is not ordered today.    Recent Labs: 11/19/2021: Magnesium 1.4 12/12/2021: ALT 22; BUN 22; Creatinine, Ser 1.25; Hemoglobin 11.6; Platelets 174; Potassium 4.0; Sodium 139  Recent Lipid Panel    Component Value Date/Time   CHOL 148 07/08/2010 2320   TRIG 54 07/08/2010 2320   HDL 76 07/08/2010 2320   CHOLHDL 1.9 Ratio 07/08/2010 2320   VLDL 11 07/08/2010 2320   LDLCALC 61 07/08/2010 2320     Risk Assessment/Calculations:           Physical Exam:    VS:  BP (!) 164/80 (BP Location: Left Arm, Patient Position: Sitting, Cuff Size: Normal)   Pulse 68   Ht 5' 5.5" (1.664 m)   Wt 197 lb 6.4 oz (89.5 kg)   SpO2 99%   BMI 32.35 kg/m        Wt Readings from Last 3 Encounters:  06/28/22 197 lb 6.4 oz (89.5 kg)  02/01/22 211 lb (95.7 kg)  11/20/21 190 lb 11.2 oz (86.5 kg)     GEN:  Well  nourished, well developed in no acute distress HEENT: Normal NECK: No JVD; No carotid bruits LYMPHATICS: No lymphadenopathy CARDIAC: RRR, no murmurs, rubs, gallops RESPIRATORY:  Clear to auscultation without rales, wheezing or rhonchi  ABDOMEN: Soft, non-tender, non-distended MUSCULOSKELETAL:  No edema; No deformity  SKIN: Warm and dry NEUROLOGIC:  Alert and oriented x 3 PSYCHIATRIC:  Normal affect   ASSESSMENT:    1. Essential hypertension   2. Controlled type 2 diabetes mellitus without complication, without long-term current use of  insulin (HCC)    PLAN:    In order of problems listed above:  Hypertension: Blood pressure remains elevated despite addition of chlorthalidone.  Right now she is on losartan 100 mg daily hydralazine 25 mg 3 times a day and chlorthalidone.  I will add amlodipine 5 mg daily to her medical regimen.  She can follow-up with hypertension clinic in a few weeks  DM2: Managed by primary care provider.           Medication Adjustments/Labs and Tests Ordered: Current medicines are reviewed at length with the patient today.  Concerns regarding medicines are outlined above.  Orders Placed This Encounter  Procedures   AMB Referral to [facility]   Meds ordered this encounter  Medications   amLODipine (NORVASC) 5 MG tablet    Sig: Take 1 tablet (5 mg total) by mouth daily.    Dispense:  90 tablet    Refill:  3    Patient Instructions  Medication Instructions:   START Amlodipine 5 mg daily-1 tablet by mouth daily   *If you need a refill on your cardiac medications before your next appointment, please call your pharmacy*  Lab Work: NONE ordered at this time of appointment   If you have labs (blood work) drawn today and your tests are completely normal, you will receive your results only by: [place] (if you have MyChart) OR A paper copy in the mail If you have any lab test that is abnormal or we need to change your treatment, we will  call you to review the results.  Testing/Procedures: NONE ordered at this time of appointment   Follow-Up: At [facility], you and your health needs are our priority.  As part of our continuing mission to provide you with exceptional heart care, we have created designated Provider Care Teams.  These Care Teams include your primary Cardiologist (physician) and Advanced Practice Providers (APPs -  Physician Assistants and Nurse Practitioners) who all work together to provide you with the care you need, when you need it.  We recommend signing up for the patient portal called "MyChart".  Sign up information is provided on this After Visit Summary.  MyChart is used to connect with patients for Virtual Visits (Telemedicine).  Patients are able to view lab/test results, encounter notes, upcoming appointments, etc.  Non-urgent messages can be sent to your provider as well.   To learn more about what you can do with MyChart, go to [email or web address].    Your next appointment:   3 month(s)  Provider:   Sanda Klein, MD  or Almyra Deforest, PA-C        Other Instructions You have been referred to Pharm D Hypertension clinic      Signed, Almyra Deforest, [place]  06/30/2022 9:59 PM    [place]

## 2022-06-28 NOTE — Patient Instructions (Addendum)
Medication Instructions:   START Amlodipine 5 mg daily-1 tablet by mouth daily   *If you need a refill on your cardiac medications before your next appointment, please call your pharmacy*  Lab Work: NONE ordered at this time of appointment   If you have labs (blood work) drawn today and your tests are completely normal, you will receive your results only by: [place] (if you have MyChart) OR A paper copy in the mail If you have any lab test that is abnormal or we need to change your treatment, we will call you to review the results.  Testing/Procedures: NONE ordered at this time of appointment   Follow-Up: At [facility], you and your health needs are our priority.  As part of our continuing mission to provide you with exceptional heart care, we have created designated Provider Care Teams.  These Care Teams include your primary Cardiologist (physician) and Advanced Practice Providers (APPs -  Physician Assistants and Nurse Practitioners) who all work together to provide you with the care you need, when you need it.  We recommend signing up for the patient portal called "MyChart".  Sign up information is provided on this After Visit Summary.  MyChart is used to connect with patients for Virtual Visits (Telemedicine).  Patients are able to view lab/test results, encounter notes, upcoming appointments, etc.  Non-urgent messages can be sent to your provider as well.   To learn more about what you can do with MyChart, go to [email or web address].    Your next appointment:   3 month(s)  Provider:   Sanda Klein, MD  or Almyra Deforest, PA-C        Other Instructions You have been referred to Pharm D Hypertension clinic

## 2022-06-28 NOTE — Progress Notes (Signed)
[facility] Note  07/12/2022     CHIEF COMPLAINT Patient presents for Retina Follow Up   HISTORY OF PRESENT ILLNESS: Ann Gomez is a 72 y.o. female who presents to the clinic today for:   HPI     Retina Follow Up   Patient presents with  CRVO/BRVO.  In both eyes.  This started 4 weeks ago.  Duration of 4 weeks.  Since onset it is stable.  I, the attending physician,  performed the HPI with the patient and updated documentation appropriately.        Comments   4 week retina follow up CRVO and PDR OU and IVA OU pt is reporting no vision changes noticed her last blood sugar reading was 137 on Monday her last A1C was high      Last edited by Bernarda Caffey, MD on 07/12/2022 11:32 AM.       Referring physician: Delford Field, [address],  [place] [address]  HISTORICAL INFORMATION:   Selected notes from the MEDICAL RECORD NUMBER Dr. Zadie Rhine pt here due to insurance reasons LEE:  Ocular Hx- PMH-    CURRENT MEDICATIONS: Current Outpatient Medications (Ophthalmic Drugs)  Medication Sig   brimonidine (ALPHAGAN) 0.2 % ophthalmic solution Place 1 drop into the right eye 2 (two) times daily.   dorzolamide-timolol (COSOPT) 22.3-6.8 MG/ML ophthalmic solution Place 1 drop into both eyes 2 (two) times daily.   latanoprost (XALATAN) 0.005 % ophthalmic solution Place 1 drop into both eyes at bedtime.   Latanoprostene Bunod (VYZULTA) 0.024 % SOLN Place 1 drop into both eyes at bedtime. QHS OU   No current facility-administered medications for this visit. (Ophthalmic Drugs)   Current Outpatient Medications (Other)  Medication Sig   amLODipine (NORVASC) 5 MG tablet Take 1 tablet (5 mg total) by mouth daily.   aspirin EC 81 MG tablet Take 1 tablet (81 mg total) by mouth daily.   chlorthalidone (HYGROTON) 25 MG tablet Take 1 tablet (25 mg total) by mouth daily.   Cholecalciferol (VITAMIN D3 PO) Take 1 capsule by mouth daily.    empagliflozin (JARDIANCE) 10 MG TABS tablet Take 10 mg by mouth daily.   Ginger, Zingiber officinalis, (GINGER ROOT PO) Take 550 mg by mouth daily.   glipiZIDE (GLUCOTROL) 10 MG tablet Take 10 mg by mouth in the morning and at bedtime.   hydrALAZINE (APRESOLINE) 25 MG tablet Take 25 mg by mouth 3 (three) times daily.   LEVEMIR FLEXTOUCH 100 UNIT/ML FlexTouch Pen Inject 17 Units into the skin 2 (two) times daily.   losartan (COZAAR) 100 MG tablet Take 100 mg by mouth daily.   mupirocin ointment (BACTROBAN) 2 % APPLY 1 APPLICATION TOPICALLY DAILY   pravastatin (PRAVACHOL) 20 MG tablet Take 20 mg by mouth at bedtime.   No current facility-administered medications for this visit. (Other)   REVIEW OF SYSTEMS: ROS   Positive for: Endocrine, Cardiovascular, Eyes Negative for: Constitutional, Gastrointestinal, Neurological, Skin, Genitourinary, Musculoskeletal, HENT, Respiratory, Psychiatric, Allergic/Imm, Heme/Lymph Last edited by Parthenia Ames, COT on 07/12/2022  8:02 AM.      ALLERGIES Allergies  Allergen Reactions   Hydrochlorothiazide Other (See Comments)    Dizziness   PAST MEDICAL HISTORY Past Medical History:  Diagnosis Date   Diabetes mellitus    Edema    Essential hypertension, benign    Hyperlipidemia    Obesity, unspecified    Past Surgical History:  Procedure Laterality Date   AMPUTATION  Right 11/20/2021   Procedure: AMPUTATION GREAT TOE;  Surgeon: Criselda Peaches, DPM;  Location: [facility];  Service: Podiatry;  Laterality: Right;   CATARACT EXTRACTION     CESAREAN SECTION     EYE SURGERY     FAMILY HISTORY Family History  Family history unknown: Yes   SOCIAL HISTORY Social History   Tobacco Use   Smoking status: Never   Smokeless tobacco: Never  Vaping Use   Vaping Use: Never used  Substance Use Topics   Alcohol use: Yes    Comment: occasional   Drug use: No       OPHTHALMIC EXAM:  Base Eye Exam     Visual Acuity (Snellen - Linear)        Right Left   Dist [place] 20/200 -1 20/40   Dist ph  NI NI         Tonometry (Tonopen, 8:13 AM)       Right Left   Pressure 25 17         Pupils       Pupils Dark Light Shape React APD   Right PERRL 3 2 Round Brisk None   Left PERRL 3 2 Round Brisk None         Visual Fields       Left Right    Full Full         Extraocular Movement       Right Left    Full, Ortho Full, Ortho         Neuro/Psych     Oriented x3: Yes   Mood/Affect: Normal         Dilation     Both eyes: 2.5% Phenylephrine @ 8:13 AM           Slit Lamp and Fundus Exam     Slit Lamp Exam       Right Left   Lids/Lashes Dermatochalasis - upper lid Dermatochalasis - upper lid   Conjunctiva/Sclera Melanosis Melanosis   Cornea 3+ Punctate epithelial erosions, well healed cataract wound arcus, trace PEE, mild endo pigment, well healed cataract wound   Anterior Chamber deep, clear, narrow angles deep, clear, narrow angles   Iris Round and dilated Round and dilated   Lens PC IOL in good position with open PC PC IOL in good position   Anterior Vitreous mild syneresis post vitrectomy         Fundus Exam       Right Left   Disc mild Pallor, Sharp rim 2+ Pallor, Sharp rim   C/D Ratio 0.4 0.5   Macula Flat, Blunted foveal reflex, scattered IRH/DBH and exudates, central edema, scattered pre-retinal heme -- all improving Flat, Blunted foveal reflex, scattered MA/DBH greatest centrally, central cystic changes / edema -- all improved, dense laser temporal macula   Vessels attenuated, Tortuous attenuated, Tortuous   Periphery Attached, scattered MA/DBH and exudates Attached, scattered MA / DBH, scattered patches of PRP with room for fill in           IMAGING AND PROCEDURES  Imaging and Procedures for 07/12/2022  OCT, Retina - OU - Both Eyes       Right Eye Quality was good. Scan locations included subfoveal. Central Foveal Thickness: 229. Progression has improved. Findings include  normal foveal contour, no SRF, intraretinal hyper-reflective material, intraretinal fluid, inner retinal atrophy, vitreomacular adhesion (Interval improvement in central CME and foveal contour, residual cystic changes nasal macula).   Left Eye Quality was good. Scan  locations included subfoveal. Central Foveal Thickness: 338. Progression has improved. Findings include no SRF, abnormal foveal contour, subretinal hyper-reflective material, epiretinal membrane, intraretinal fluid, subretinal fluid (Interval improvement in IRF and edema).   Notes *Images captured and stored on drive  Diagnosis / Impression:  OD: CRVO -- Interval improvement in central CME and foveal contour, residual cystic changes nasal macula OS: +DME -- Interval improvement in IRF and edema  Clinical management:  See below  Abbreviations: NFP - Normal foveal profile. CME - cystoid macular edema. PED - pigment epithelial detachment. IRF - intraretinal fluid. SRF - subretinal fluid. EZ - ellipsoid zone. ERM - epiretinal membrane. ORA - outer retinal atrophy. ORT - outer retinal tubulation. SRHM - subretinal hyper-reflective material. IRHM - intraretinal hyper-reflective material      Intravitreal Injection, Pharmacologic Agent - OD - Right Eye       Time Out 07/12/2022. 8:47 AM. Confirmed correct patient, procedure, site, and patient consented.   Anesthesia Topical anesthesia was used. Anesthetic medications included Lidocaine 4%, Tetracaine 0.5%.   Procedure Preparation included 5% betadine to ocular surface, eyelid speculum. A (32g) needle was used.   Injection: 1.25 mg Bevacizumab 1.25mg /0.43ml   Route: Intravitreal, Site: Right Eye   NDC: B9831080, LotLC:[id number], Expiration date: 10/27/2022   Post-op Post injection exam found visual acuity of at least counting fingers. The patient tolerated the procedure well. There were no complications. The patient received written and verbal post procedure care  education. Post injection medications were not given.      Intravitreal Injection, Pharmacologic Agent - OS - Left Eye       Time Out 07/12/2022. 8:49 AM. Confirmed correct patient, procedure, site, and patient consented.   Anesthesia Topical anesthesia was used. Anesthetic medications included Lidocaine 2%, Proparacaine 0.5%.   Procedure Preparation included 5% betadine to ocular surface, eyelid speculum. A (32g) needle was used.   Injection: 1.25 mg Bevacizumab 1.25mg /0.5ml   Route: Intravitreal, Site: Left Eye   NDC: B9831080, LotEX:[id number], Expiration date: 09/03/2022   Post-op Post injection exam found visual acuity of at least counting fingers. The patient tolerated the procedure well. There were no complications. The patient received written and verbal post procedure care education.             ASSESSMENT/PLAN:    ICD-10-CM   1. Central retinal vein occlusion with macular edema of right eye  H34.8110 OCT, Retina - OU - Both Eyes    Intravitreal Injection, Pharmacologic Agent - OD - Right Eye    Bevacizumab (AVASTIN) SOLN 1.25 mg    2. Proliferative diabetic retinopathy of both eyes with macular edema associated with type 2 diabetes mellitus (HCC)  [id number] Intravitreal Injection, Pharmacologic Agent - OS - Left Eye    Bevacizumab (AVASTIN) SOLN 1.25 mg    3. History of vitrectomy  Z98.890     4. Essential hypertension  I10     5. Hypertensive retinopathy of both eyes  H35.033     6. Pseudophakia, both eyes  Z96.1     7. Ocular hypertension of right eye  H40.051      1. CRVO w/ CME, OD  - history of multiple IVA OD w/ [facility] (last 06.06.23 -- )  - s/p IVA OD #1 (02.20.24)  - BCVA OD 20/150  - OCT shows OD: Interval improvement in central CME and foveal contour, residual cystic changes nasal macula at 4 weeks  - recommend IVA OD #2 today 03.19.24 w/ f/u in 4 wks  -  RBA of procedure discussed, questions answered - informed consent obtained and  signed  - see procedure note - f/u 4 wks -- DFE/OCT, possible injection  2,3. Proliferative diabetic retinopathy, both eyes  - history of PPV w/ membrane peel and laser OS for PDR ([facility] - 06.28.23)  - s/p IVA OU #1 (02.20.24) - exam shows scattered MA, DBH and preretinal heme OU; OS with PRP 360 - FA (02.20.24) shows OD: fine NVD; scattered patches of vascular nonperfusion; enlarged FAZ; diffuse leaking MA; OS: Dense cluster of leaking MA's ST mac; staining of PRP laser 360; no active NV - OCT shows OS: Interval improvement in IRF and edema - recommend IVA OU #2 today, 03.19.24 for DME - pt wishes to proceed with injections - RBA of procedure discussed, questions answered - informed consent obtained and signed - see procedure note - f/u in 4 wks -- DFE/OCT, possible injection  4,5. Hypertensive retinopathy OU - discussed importance of tight BP control - continue to monitor  6. Pseudophakia OU  - s/p CE/IOL OU  - IOL in good position, doing well  - continue to monitor  7. Ocular Hypertension OD  - IOP today: 25  - on Cosopt BID OU and latanoprost QHS OU per Dr. Katy Fitch  - will add brimonidine BID OD only  Ophthalmic Meds Ordered this visit:  Meds ordered this encounter  Medications   brimonidine (ALPHAGAN) 0.2 % ophthalmic solution    Sig: Place 1 drop into the right eye 2 (two) times daily.    Dispense:  10 mL    Refill:  3   Bevacizumab (AVASTIN) SOLN 1.25 mg   Bevacizumab (AVASTIN) SOLN 1.25 mg     Return in about 4 weeks (around 08/09/2022) for f/u PDR OU, DFE, OCT.  There are no Patient Instructions on file for this visit.   Explained the diagnoses, plan, and follow up with the patient and they expressed understanding.  Patient expressed understanding of the importance of proper follow up care.   This document serves as a record of services personally performed by Gardiner Sleeper, MD, PhD. It was created on their behalf by Renaldo Reel, [place] an ophthalmic  technician. The creation of this record is the provider's dictation and/or activities during the visit.    Electronically signed by:  Renaldo Reel, COT  03.05.24 11:56 AM  This document serves as a record of services personally performed by Gardiner Sleeper, MD, PhD. It was created on their behalf by San Jetty. Owens Shark, OA an ophthalmic technician. The creation of this record is the provider's dictation and/or activities during the visit.    Electronically signed by: San Jetty. Owens Shark, [place] 03.19.2024 11:56 AM  Gardiner Sleeper, M.D., Ph.D. Diseases & Surgery of the Retina and Vitreous [facility]  I have reviewed the above documentation for accuracy and completeness, and I agree with the above. Gardiner Sleeper, M.D., Ph.D. 07/12/22 11:57 AM   Abbreviations: M myopia (nearsighted); A astigmatism; H hyperopia (farsighted); P presbyopia; Mrx spectacle prescription;  CTL contact lenses; OD right eye; OS left eye; OU both eyes  XT exotropia; ET esotropia; PEK punctate epithelial keratitis; PEE punctate epithelial erosions; DES dry eye syndrome; MGD meibomian gland dysfunction; ATs artificial tears; PFAT's preservative free artificial tears; Manuel [place] nuclear sclerotic cataract; PSC posterior subcapsular cataract; ERM epi-retinal membrane; PVD posterior vitreous detachment; RD retinal detachment; DM diabetes mellitus; DR diabetic retinopathy; NPDR non-proliferative diabetic retinopathy; PDR proliferative diabetic retinopathy; CSME clinically significant macular  edema; DME diabetic macular edema; dbh dot blot hemorrhages; CWS cotton wool spot; POAG primary open angle glaucoma; C/D cup-to-disc ratio; HVF humphrey visual field; GVF goldmann visual field; OCT optical coherence tomography; IOP intraocular pressure; BRVO Branch retinal vein occlusion; CRVO central retinal vein occlusion; CRAO central retinal artery occlusion; BRAO branch retinal artery occlusion; RT retinal tear; SB scleral  buckle; PPV pars plana vitrectomy; VH Vitreous hemorrhage; PRP panretinal laser photocoagulation; IVK intravitreal kenalog; VMT vitreomacular traction; MH Macular hole;  NVD neovascularization of the disc; NVE neovascularization elsewhere; AREDS age related eye disease study; ARMD age related macular degeneration; POAG primary open angle glaucoma; EBMD epithelial/anterior basement membrane dystrophy; ACIOL anterior chamber intraocular lens; IOL intraocular lens; PCIOL posterior chamber intraocular lens; Phaco/IOL phacoemulsification with intraocular lens placement; [place] photorefractive keratectomy; LASIK laser assisted in situ keratomileusis; HTN hypertension; DM diabetes mellitus; COPD chronic obstructive pulmonary disease

## 2022-06-29 ENCOUNTER — Ambulatory Visit (INDEPENDENT_AMBULATORY_CARE_PROVIDER_SITE_OTHER): Payer: Medicare HMO

## 2022-06-29 DIAGNOSIS — M2041 Other hammer toe(s) (acquired), right foot: Secondary | ICD-10-CM

## 2022-06-29 DIAGNOSIS — M2042 Other hammer toe(s) (acquired), left foot: Secondary | ICD-10-CM

## 2022-06-29 DIAGNOSIS — E1142 Type 2 diabetes mellitus with diabetic polyneuropathy: Secondary | ICD-10-CM

## 2022-06-29 NOTE — Progress Notes (Signed)
Patient presents to the office today for diabetic shoe and insole measuring.  Patient was measured with brannock device to determine size and width for 1 pair of extra depth shoes and foam casted for 3 pair of insoles.   ABN signed.   Documentation of medical necessity will be sent to patient's treating diabetic doctor to verify and sign.   Patient's diabetic provider: Dewayne Shorter, MD   Shoes and insoles will be ordered at that time and patient will be notified for an appointment for fitting when they arrive.   Brannock measurement: 9.5W   Patient shoe selection-   1st   Shoe choice:   [id number]  Shoe size ordered: 9.5W

## 2022-06-30 ENCOUNTER — Encounter: Payer: Self-pay | Admitting: Physician Assistant

## 2022-07-01 ENCOUNTER — Other Ambulatory Visit: Payer: Self-pay | Admitting: Family Medicine

## 2022-07-01 DIAGNOSIS — Z13820 Encounter for screening for osteoporosis: Secondary | ICD-10-CM

## 2022-07-12 ENCOUNTER — Encounter (INDEPENDENT_AMBULATORY_CARE_PROVIDER_SITE_OTHER): Payer: Self-pay | Admitting: Ophthalmology

## 2022-07-12 ENCOUNTER — Ambulatory Visit (INDEPENDENT_AMBULATORY_CARE_PROVIDER_SITE_OTHER): Payer: Medicare HMO | Admitting: Ophthalmology

## 2022-07-12 DIAGNOSIS — H34811 Central retinal vein occlusion, right eye, with macular edema: Secondary | ICD-10-CM | POA: Diagnosis not present

## 2022-07-12 DIAGNOSIS — Z9889 Other specified postprocedural states: Secondary | ICD-10-CM

## 2022-07-12 DIAGNOSIS — I10 Essential (primary) hypertension: Secondary | ICD-10-CM | POA: Diagnosis not present

## 2022-07-12 DIAGNOSIS — H35033 Hypertensive retinopathy, bilateral: Secondary | ICD-10-CM | POA: Diagnosis not present

## 2022-07-12 DIAGNOSIS — E113513 Type 2 diabetes mellitus with proliferative diabetic retinopathy with macular edema, bilateral: Secondary | ICD-10-CM | POA: Diagnosis not present

## 2022-07-12 DIAGNOSIS — Z961 Presence of intraocular lens: Secondary | ICD-10-CM

## 2022-07-12 DIAGNOSIS — H40051 Ocular hypertension, right eye: Secondary | ICD-10-CM

## 2022-07-12 MED ORDER — BRIMONIDINE TARTRATE 0.2 % OP SOLN: 1.0000 [drp] | Freq: Two times a day (BID) | OPHTHALMIC | 3 refills | Status: DC

## 2022-07-12 MED ORDER — BEVACIZUMAB CHEMO INJECTION 1.25MG/0.05ML SYRINGE FOR KALEIDOSCOPE
1.2500 mg | INTRAVITREAL | Status: AC | PRN
  Administered 2022-07-12: 1.25 mg via INTRAVITREAL

## 2022-07-13 ENCOUNTER — Other Ambulatory Visit (INDEPENDENT_AMBULATORY_CARE_PROVIDER_SITE_OTHER): Payer: Medicare HMO | Admitting: Podiatry

## 2022-07-13 DIAGNOSIS — Z89421 Acquired absence of other right toe(s): Secondary | ICD-10-CM

## 2022-07-13 DIAGNOSIS — L84 Corns and callosities: Secondary | ICD-10-CM

## 2022-07-13 DIAGNOSIS — M2041 Other hammer toe(s) (acquired), right foot: Secondary | ICD-10-CM

## 2022-07-13 DIAGNOSIS — M2042 Other hammer toe(s) (acquired), left foot: Secondary | ICD-10-CM

## 2022-07-13 DIAGNOSIS — E1142 Type 2 diabetes mellitus with diabetic polyneuropathy: Secondary | ICD-10-CM

## 2022-07-13 NOTE — Progress Notes (Signed)
1. Type 2 diabetes mellitus with polyneuropathy ([place])   2. 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 Acquired hammertoes of both feet   4. Corns   Plan: Dispense one pair extra depth shoes and 3 pair custom insoles. Prefer shoes with stretchable uppers.  Orders Placed This Encounter  Procedures   For Home Use Only DME Diabetic Shoe    Dispense one pair extra depth shoes and 3 pair custom insoles. Prefer shoes with stretchable uppers.   Marzetta Board, DPM

## 2022-08-04 ENCOUNTER — Ambulatory Visit: Payer: Medicare HMO | Attending: Physician Assistant

## 2022-08-04 NOTE — Progress Notes (Deleted)
Office Visit    Patient Name: Ann Gomez Date of Encounter: 08/04/2022  Primary Care Provider:  Dot Been, FNP Primary Cardiologist:  Thurmon Fair, MD  Chief Complaint    Hypertension  Significant Past Medical History   HLD 8/23 LDL  69 on pravastatin 20   DM2 2/24 A1c 9.8 on empagliflozin 10, glipizide 10 bid, Levimir 17 u bid  CKD Stage 3 - 2/24 SCr 1.67, GFR 32  obesity At last visit BMI down to 32.34 (~14 lb weight loss over 5 months)    Allergies  Allergen Reactions   Hydrochlorothiazide Other (See Comments)    Dizziness    History of Present Illness    Ann Gomez is a 72 y.o. female patient of Dr Royann Shivers, in the office today for hypertension evaluation.  She was most recently seen by Azalee Course PA last month, at which time her BP was noted to be 164/80.  He added amlodipine 5 mg to her losartan, chlorthalidone and hydralazine.    Today she is in the office for evaluation.  Blood pressures noted at other office visits since seeing Azalee Course do not show any improvement.    Stop hydralazine and add carvedilol??    Blood Pressure Goal:  130/80  Current Medications:    Previously tried:  hctz - dizziness  Family Hx:     Social Hx:      Tobacco:  Alcohol:  Caffeine: Diet:      Exercise:   Home BP readings:      Adherence Assessment  Do you ever forget to take your medication? [] Yes [] No  Do you ever skip doses due to side effects? [] Yes [] No  Do you have trouble affording your medicines? [] Yes [] No  Are you ever unable to pick up your medication due to transportation difficulties? [] Yes [] No  Do you ever stop taking your medications because you don't believe they are helping? [] Yes [] No  Do you check your weight daily? [] Yes [] No   Adherence strategy: ***  Barriers to obtaining medications: ***     Accessory Clinical Findings    Lab Results  Component Value Date   CREATININE 1.25 (H) 12/12/2021   BUN 22 12/12/2021   NA 139  12/12/2021   K 4.0 12/12/2021   CL 109 12/12/2021   CO2 22 12/12/2021   Lab Results  Component Value Date   ALT 22 12/12/2021   AST 24 12/12/2021   ALKPHOS 63 12/12/2021   BILITOT 0.8 12/12/2021   Lab Results  Component Value Date   HGBA1C 7.8 (H) 11/19/2021    Home Medications    Current Outpatient Medications  Medication Sig Dispense Refill   amLODipine (NORVASC) 5 MG tablet Take 1 tablet (5 mg total) by mouth daily. 90 tablet 3   aspirin EC 81 MG tablet Take 1 tablet (81 mg total) by mouth daily. 30 tablet 3   brimonidine (ALPHAGAN) 0.2 % ophthalmic solution Place 1 drop into the right eye 2 (two) times daily. 10 mL 3   chlorthalidone (HYGROTON) 25 MG tablet Take 1 tablet (25 mg total) by mouth daily. 30 tablet 3   Cholecalciferol (VITAMIN D3 PO) Take 1 capsule by mouth daily.     dorzolamide-timolol (COSOPT) 22.3-6.8 MG/ML ophthalmic solution Place 1 drop into both eyes 2 (two) times daily.     empagliflozin (JARDIANCE) 10 MG TABS tablet Take 10 mg by mouth daily.     Ginger, Zingiber officinalis, (GINGER ROOT PO) Take 550 mg  by mouth daily.     glipiZIDE (GLUCOTROL) 10 MG tablet Take 10 mg by mouth in the morning and at bedtime.     hydrALAZINE (APRESOLINE) 25 MG tablet Take 25 mg by mouth 3 (three) times daily.     latanoprost (XALATAN) 0.005 % ophthalmic solution Place 1 drop into both eyes at bedtime.     Latanoprostene Bunod (VYZULTA) 0.024 % SOLN Place 1 drop into both eyes at bedtime. QHS OU     LEVEMIR FLEXTOUCH 100 UNIT/ML FlexTouch Pen Inject 17 Units into the skin 2 (two) times daily.     losartan (COZAAR) 100 MG tablet Take 100 mg by mouth daily.     mupirocin ointment (BACTROBAN) 2 % APPLY 1 APPLICATION TOPICALLY DAILY 22 g 0   pravastatin (PRAVACHOL) 20 MG tablet Take 20 mg by mouth at bedtime.     No current facility-administered medications for this visit.     No BP recorded.  {Refresh Note OR Click here to enter BP  :1}***   Assessment & Plan    No  problem-specific Assessment & Plan notes found for this encounter.   Phillips Hay PharmD CPP [facility]  [address] [place], [place] [address] [phone]

## 2022-08-09 ENCOUNTER — Encounter (INDEPENDENT_AMBULATORY_CARE_PROVIDER_SITE_OTHER): Payer: Medicare HMO | Admitting: Ophthalmology

## 2022-08-09 DIAGNOSIS — H34811 Central retinal vein occlusion, right eye, with macular edema: Secondary | ICD-10-CM

## 2022-08-09 DIAGNOSIS — Z9889 Other specified postprocedural states: Secondary | ICD-10-CM

## 2022-08-09 DIAGNOSIS — E113513 Type 2 diabetes mellitus with proliferative diabetic retinopathy with macular edema, bilateral: Secondary | ICD-10-CM

## 2022-08-09 DIAGNOSIS — Z961 Presence of intraocular lens: Secondary | ICD-10-CM

## 2022-08-09 DIAGNOSIS — H35033 Hypertensive retinopathy, bilateral: Secondary | ICD-10-CM

## 2022-08-09 DIAGNOSIS — H40051 Ocular hypertension, right eye: Secondary | ICD-10-CM

## 2022-08-09 DIAGNOSIS — I10 Essential (primary) hypertension: Secondary | ICD-10-CM

## 2022-08-16 ENCOUNTER — Encounter (INDEPENDENT_AMBULATORY_CARE_PROVIDER_SITE_OTHER): Payer: Medicare HMO | Admitting: Ophthalmology

## 2022-08-16 ENCOUNTER — Encounter (INDEPENDENT_AMBULATORY_CARE_PROVIDER_SITE_OTHER): Payer: Self-pay

## 2022-08-16 DIAGNOSIS — H35033 Hypertensive retinopathy, bilateral: Secondary | ICD-10-CM

## 2022-08-16 DIAGNOSIS — E113513 Type 2 diabetes mellitus with proliferative diabetic retinopathy with macular edema, bilateral: Secondary | ICD-10-CM

## 2022-08-16 DIAGNOSIS — Z961 Presence of intraocular lens: Secondary | ICD-10-CM

## 2022-08-16 DIAGNOSIS — H40051 Ocular hypertension, right eye: Secondary | ICD-10-CM

## 2022-08-16 DIAGNOSIS — I10 Essential (primary) hypertension: Secondary | ICD-10-CM

## 2022-08-16 DIAGNOSIS — H34811 Central retinal vein occlusion, right eye, with macular edema: Secondary | ICD-10-CM

## 2022-08-16 DIAGNOSIS — Z9889 Other specified postprocedural states: Secondary | ICD-10-CM

## 2022-09-06 ENCOUNTER — Ambulatory Visit: Payer: Medicare Other | Admitting: Podiatry

## 2022-09-27 ENCOUNTER — Ambulatory Visit (INDEPENDENT_AMBULATORY_CARE_PROVIDER_SITE_OTHER): Payer: Medicare Other | Admitting: Podiatry

## 2022-09-27 DIAGNOSIS — Z91199 Patient's noncompliance with other medical treatment and regimen due to unspecified reason: Secondary | ICD-10-CM

## 2022-09-28 IMAGING — DX DG CHEST 1V PORT
1 series · 1 of 1 positions shown · non-contrast
Comparison: None.

CLINICAL DATA: Syncope

EXAM:
PORTABLE CHEST 1 VIEW

[chest ap]
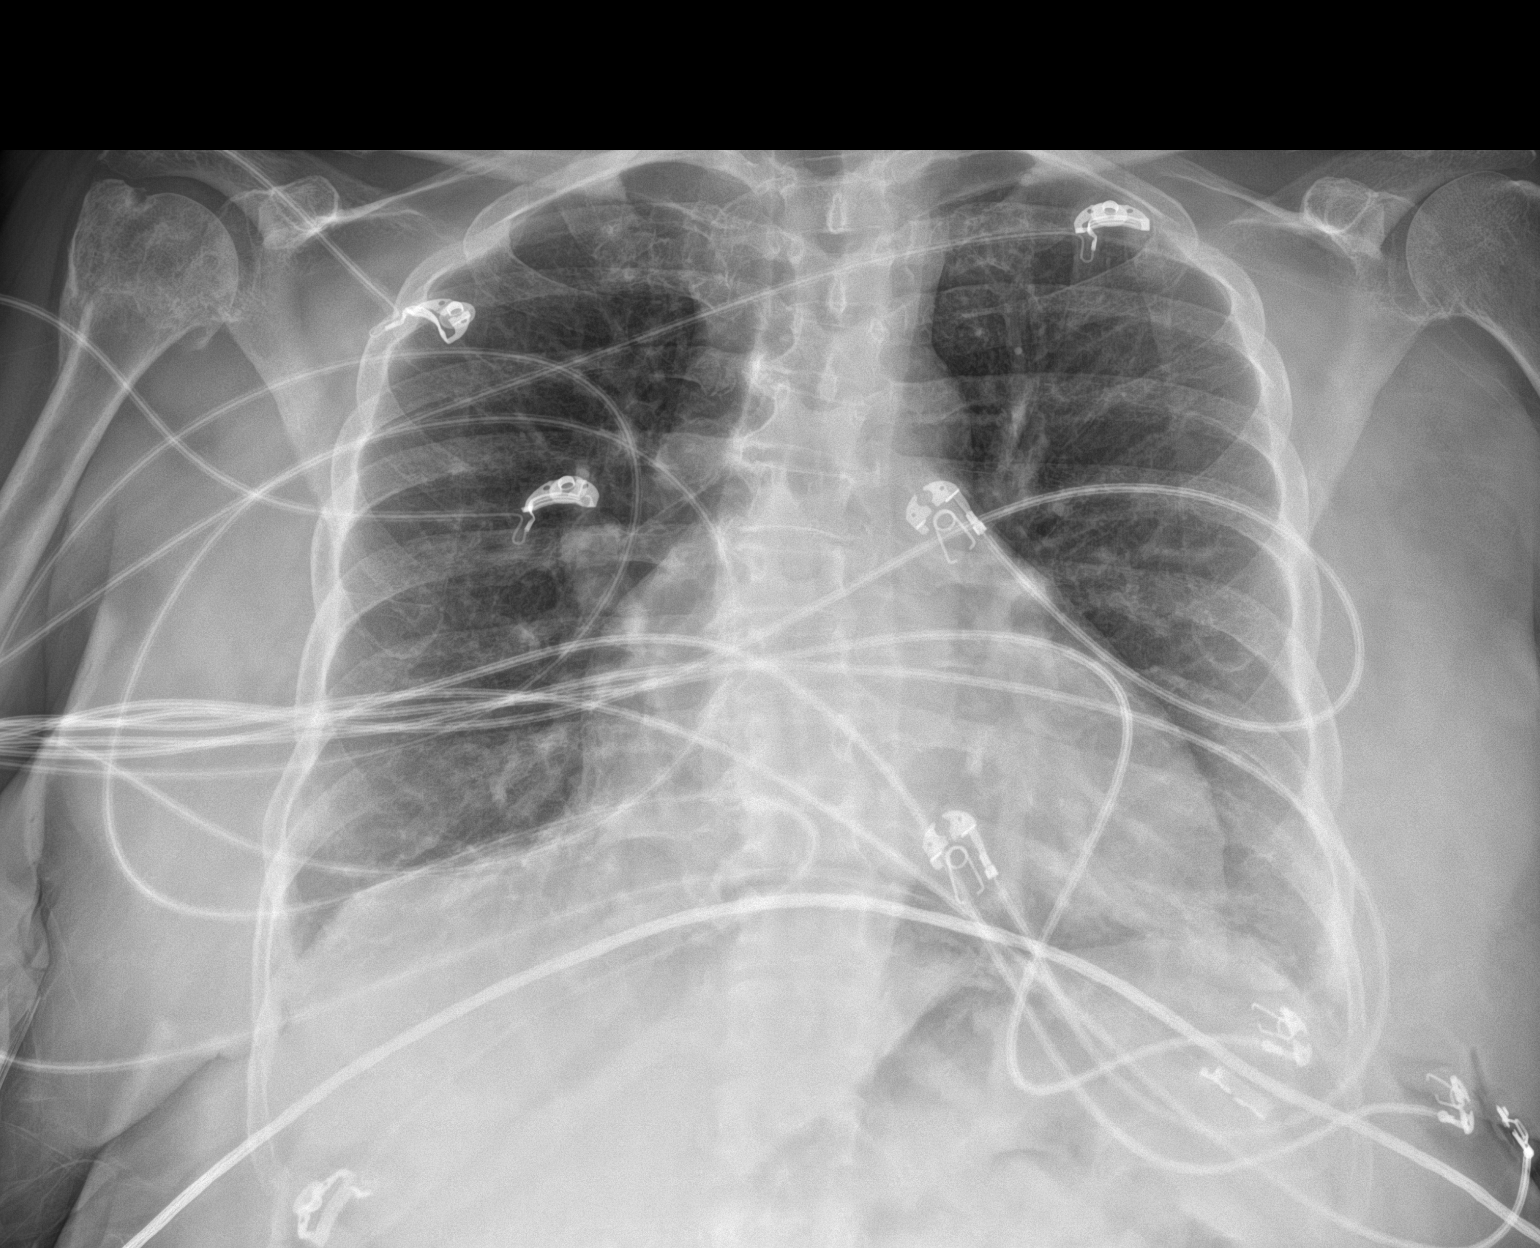

[1 of 1 positions shown; findings below may reference images not displayed]

FINDINGS: Minimal atelectasis at the left lung base. No pleural effusion.
Top-normal heart size. Sequelae of prior right humerus fracture.
IMPRESSION: Minimal atelectasis at the left lung base.

## 2022-09-28 NOTE — Progress Notes (Signed)
Patient was no-show for appointment today 

## 2022-10-05 ENCOUNTER — Other Ambulatory Visit: Payer: Self-pay | Admitting: Nephrology

## 2022-10-05 ENCOUNTER — Ambulatory Visit: Payer: Medicare Other | Attending: Physician Assistant | Admitting: Physician Assistant

## 2022-10-05 DIAGNOSIS — I129 Hypertensive chronic kidney disease with stage 1 through stage 4 chronic kidney disease, or unspecified chronic kidney disease: Secondary | ICD-10-CM

## 2022-10-05 DIAGNOSIS — N1832 Chronic kidney disease, stage 3b: Secondary | ICD-10-CM

## 2022-10-05 LAB — LAB REPORT - SCANNED: EGFR: 30

## 2022-10-06 ENCOUNTER — Encounter: Payer: Self-pay | Admitting: Nephrology

## 2022-10-07 NOTE — Progress Notes (Signed)
This encounter was created in error - please disregard.

## 2022-10-12 ENCOUNTER — Ambulatory Visit: Payer: Medicare Other | Admitting: Podiatry

## 2022-10-26 ENCOUNTER — Ambulatory Visit (INDEPENDENT_AMBULATORY_CARE_PROVIDER_SITE_OTHER): Payer: Medicare Other | Admitting: Podiatry

## 2022-10-26 ENCOUNTER — Encounter: Payer: Self-pay | Admitting: Podiatry

## 2022-10-26 DIAGNOSIS — L84 Corns and callosities: Secondary | ICD-10-CM | POA: Diagnosis not present

## 2022-10-26 DIAGNOSIS — Z89421 Acquired absence of other right toe(s): Secondary | ICD-10-CM

## 2022-10-26 DIAGNOSIS — M79675 Pain in left toe(s): Secondary | ICD-10-CM

## 2022-10-26 DIAGNOSIS — M79674 Pain in right toe(s): Secondary | ICD-10-CM

## 2022-10-26 DIAGNOSIS — E1142 Type 2 diabetes mellitus with diabetic polyneuropathy: Secondary | ICD-10-CM

## 2022-10-26 DIAGNOSIS — B351 Tinea unguium: Secondary | ICD-10-CM

## 2022-10-26 NOTE — Progress Notes (Signed)
[facility] - Clinic Note  10/31/2022     CHIEF COMPLAINT Patient presents for Retina Follow Up   HISTORY OF PRESENT ILLNESS: Ann Gomez is a 72 y.o. female who presents to the clinic today for:   HPI     Retina Follow Up   Patient presents with  CRVO/BRVO.  In right eye.  This started 4 weeks ago.  Duration of 4 weeks.  Since onset it is stable.  I, the attending physician,  performed the HPI with the patient and updated documentation appropriately.        Comments   4 week retina follow up CRVO OD and IVA OU pt is reporting no vision changes noticed she is using Cosopt bid ou latanoprost at bedtime ou and brim bid OD       Last edited by [name], MD on 10/31/2022 11:59 AM.    Delayed f/u -- 3.5 mos instead of 4 wks due to insurance issues   Referring physician: Dot Been, FNP [address] [place],  [place] [address]  HISTORICAL INFORMATION:   Selected notes from the MEDICAL RECORD NUMBER Dr. [name] pt here due to insurance reasons LEE:  Ocular Hx- PMH-    CURRENT MEDICATIONS: Current Outpatient Medications (Ophthalmic Drugs)  Medication Sig   brimonidine (ALPHAGAN) 0.2 % ophthalmic solution Place 1 drop into the right eye 2 (two) times daily.   dorzolamide-timolol (COSOPT) 22.3-6.8 MG/ML ophthalmic solution Place 1 drop into both eyes 2 (two) times daily.   latanoprost (XALATAN) 0.005 % ophthalmic solution Place 1 drop into both eyes at bedtime.   Latanoprostene Bunod (VYZULTA) 0.024 % SOLN Place 1 drop into both eyes at bedtime. QHS OU   No current facility-administered medications for this visit. (Ophthalmic Drugs)   Current Outpatient Medications (Other)  Medication Sig   amLODipine (NORVASC) 5 MG tablet Take 1 tablet (5 mg total) by mouth daily.   aspirin EC 81 MG tablet Take 1 tablet (81 mg total) by mouth daily.   chlorthalidone (HYGROTON) 25 MG tablet Take 1 tablet (25 mg total) by mouth daily.   Cholecalciferol  (VITAMIN D3 PO) Take 1 capsule by mouth daily.   Ginger, Zingiber officinalis, (GINGER ROOT PO) Take 550 mg by mouth daily.   glipiZIDE (GLUCOTROL) 10 MG tablet Take 10 mg by mouth in the morning and at bedtime.   hydrALAZINE (APRESOLINE) 25 MG tablet Take 25 mg by mouth 3 (three) times daily.   LEVEMIR FLEXTOUCH 100 UNIT/ML FlexTouch Pen Inject 17 Units into the skin 2 (two) times daily.   losartan (COZAAR) 100 MG tablet Take 100 mg by mouth daily.   mupirocin ointment (BACTROBAN) 2 % APPLY 1 APPLICATION TOPICALLY DAILY   pravastatin (PRAVACHOL) 20 MG tablet Take 20 mg by mouth at bedtime.   No current facility-administered medications for this visit. (Other)   REVIEW OF SYSTEMS: ROS   Positive for: Endocrine, Cardiovascular, Eyes Negative for: Constitutional, Gastrointestinal, Neurological, Skin, Genitourinary, Musculoskeletal, HENT, Respiratory, Psychiatric, Allergic/Imm, Heme/Lymph Last edited by Etheleen Mayhew, COT on 10/31/2022  9:31 AM.     ALLERGIES Allergies  Allergen Reactions   Hydrochlorothiazide Other (See Comments)    Dizziness   PAST MEDICAL HISTORY Past Medical History:  Diagnosis Date   Diabetes mellitus    Edema    Essential hypertension, benign    Hyperlipidemia    Obesity, unspecified    Past Surgical History:  Procedure Laterality Date   AMPUTATION Right 11/20/2021  Procedure: AMPUTATION GREAT TOE;  Surgeon: Edwin Cap, DPM;  Location: [facility] OR;  Service: Podiatry;  Laterality: Right;   CATARACT EXTRACTION     CESAREAN SECTION     EYE SURGERY     FAMILY HISTORY Family History  Family history unknown: Yes   SOCIAL HISTORY Social History   Tobacco Use   Smoking status: Never   Smokeless tobacco: Never  Vaping Use   Vaping Use: Never used  Substance Use Topics   Alcohol use: Yes    Comment: occasional   Drug use: No       OPHTHALMIC EXAM:  Base Eye Exam     Visual Acuity (Snellen - Linear)       Right Left   Dist [place] 20/400  20/30 -2   Dist ph [place] 20/300 NI         Tonometry (Tonopen, 9:40 AM)       Right Left   Pressure 21 17         Pupils       Pupils Dark Light Shape React APD   Right PERRL 3 2 Round Brisk None   Left PERRL 3 2 Round Brisk None         Visual Fields       Left Right    Full Full         Extraocular Movement       Right Left    Full, Ortho Full, Ortho         Neuro/Psych     Oriented x3: Yes   Mood/Affect: Normal         Dilation     Both eyes: 2.5% Phenylephrine @ 9:40 AM           Slit Lamp and Fundus Exam     Slit Lamp Exam       Right Left   Lids/Lashes Dermatochalasis - upper lid Dermatochalasis - upper lid   Conjunctiva/Sclera Melanosis Melanosis   Cornea 3+ Punctate epithelial erosions, well healed cataract wound arcus, trace PEE, mild endo pigment, well healed cataract wound   Anterior Chamber deep, clear, narrow angles deep, clear, narrow angles   Iris Round and dilated Round and dilated   Lens PC IOL in good position with open PC PC IOL in good position   Anterior Vitreous mild syneresis post vitrectomy         Fundus Exam       Right Left   Disc mild Pallor, Sharp rim 2+ Pallor, Sharp rim   C/D Ratio 0.4 0.5   Macula Flat, Blunted foveal reflex, scattered IRH/DBH and exudates, central edema Flat, Blunted foveal reflex, scattered MA/DBH greatest centrally, central cystic changes / edema, dense laser temporal macula   Vessels attenuated, Tortuous attenuated, Tortuous   Periphery Attached, scattered MA/DBH and exudates Attached, scattered MA / DBH, scattered patches of PRP with room for fill in           IMAGING AND PROCEDURES  Imaging and Procedures for 10/31/2022  OCT, Retina - OU - Both Eyes       Right Eye Quality was good. Scan locations included subfoveal. Central Foveal Thickness: 238. Progression has worsened. Findings include normal foveal contour, no SRF, intraretinal hyper-reflective material, intraretinal  fluid, inner retinal atrophy, vitreomacular adhesion (Interval increase in CME nasal macula, diffuse IRA).   Left Eye Quality was good. Scan locations included subfoveal. Central Foveal Thickness: 285. Progression has improved. Findings include no SRF, abnormal foveal contour, subretinal  hyper-reflective material, epiretinal membrane, intraretinal fluid, subretinal fluid (Scattered IRF and edema greatest nasal macula -- slightly improved).   Notes *Images captured and stored on drive  Diagnosis / Impression:  OD: CRVO -- Interval increase in CME nasal macula, diffuse IRA OS: +DME -- Scattered IRF and edema greatest nasal macula -- slightly improved  Clinical management:  See below  Abbreviations: NFP - Normal foveal profile. CME - cystoid macular edema. PED - pigment epithelial detachment. IRF - intraretinal fluid. SRF - subretinal fluid. EZ - ellipsoid zone. ERM - epiretinal membrane. ORA - outer retinal atrophy. ORT - outer retinal tubulation. SRHM - subretinal hyper-reflective material. IRHM - intraretinal hyper-reflective material      Intravitreal Injection, Pharmacologic Agent - OD - Right Eye       Time Out 10/31/2022. 10:48 AM. Confirmed correct patient, procedure, site, and patient consented.   Anesthesia Topical anesthesia was used. Anesthetic medications included Lidocaine 4%, Tetracaine 0.5%.   Procedure Preparation included 5% betadine to ocular surface, eyelid speculum. A (32g) needle was used.   Injection: 1.25 mg Bevacizumab 1.25mg /0.15ml   Route: Intravitreal, Site: Right Eye   NDC: P3213405, Lot: [id number], Expiration date: 12/04/2022   Post-op Post injection exam found visual acuity of at least counting fingers. The patient tolerated the procedure well. There were no complications. The patient received written and verbal post procedure care education. Post injection medications were not given.      Intravitreal Injection, Pharmacologic Agent - OS - Left Eye        Time Out 10/31/2022. 10:48 AM. Confirmed correct patient, procedure, site, and patient consented.   Anesthesia Topical anesthesia was used. Anesthetic medications included Lidocaine 2%, Proparacaine 0.5%.   Procedure Preparation included 5% betadine to ocular surface, eyelid speculum. A (32g) needle was used.   Injection: 1.25 mg Bevacizumab 1.25mg /0.1ml   Route: Intravitreal, Site: Left Eye   NDC: P3213405, Lot: [id number], Expiration date: 02/15/2023   Post-op Post injection exam found visual acuity of at least counting fingers. The patient tolerated the procedure well. There were no complications. The patient received written and verbal post procedure care education.            ASSESSMENT/PLAN:    ICD-10-CM   1. Central retinal vein occlusion with macular edema of right eye  H34.8110 OCT, Retina - OU - Both Eyes    Intravitreal Injection, Pharmacologic Agent - OD - Right Eye    Bevacizumab (AVASTIN) SOLN 1.25 mg    2. Proliferative diabetic retinopathy of both eyes with macular edema associated with type 2 diabetes mellitus (HCC)  E11.3513 OCT, Retina - OU - Both Eyes    Intravitreal Injection, Pharmacologic Agent - OS - Left Eye    Bevacizumab (AVASTIN) SOLN 1.25 mg    3. Long term (current) use of oral hypoglycemic drugs  Z79.84     4. Encounter for long-term (current) use of insulin (HCC)  Z79.4     5. History of vitrectomy  Z98.890     6. Essential hypertension  I10     7. Hypertensive retinopathy of both eyes  H35.033     8. Pseudophakia, both eyes  Z96.1     9. Ocular hypertension of right eye  H40.051      1. CRVO w/ CME, OD  - delayed f/u -- 3.5 mos instead of 4 wks due to insurance issues  - history of multiple IVA OD w/ [facility] (last 06.06.23)  - s/p IVA OD #1 (02.20.24), #2 (03.19.24)  -  BCVA OD 20/300 decrease - OCT shows OD: Interval increase in CME nasal macula, diffuse IRA at 3.5 mos  - recommend IVA OD #3 today 07.08.24 w/ f/u in 4  wks  - RBA of procedure discussed, questions answered - informed consent obtained and signed  - see procedure note - f/u 4 wks -- DFE/OCT, possible injection  2-5. Proliferative diabetic retinopathy, both eyes  - A1C 9.8 on 02.16.24  - history of PPV w/ membrane peel and laser OS for PDR ([facility] - 06.28.23)  - s/p IVA OU #1 (02.20.24), #2 (03.19.24) - exam shows scattered MA, DBH and preretinal heme OU; OS with PRP 360 - FA (02.20.24) shows OD: fine NVD; scattered patches of vascular nonperfusion; enlarged FAZ; diffuse leaking MA; OS: Dense cluster of leaking MA's ST mac; staining of PRP laser 360; no active NV - OCT shows OD: Interval increase in CME nasal macula, diffuse IRA; OS: +DME -- Scattered IRF and edema greatest nasal macula -- slightly improved - recommend IVA OU #3 today, 07.08.24 for DME - pt wishes to proceed with injections - RBA of procedure discussed, questions answered - Avastin informed consent obtained and signed OU 02.20.24 - see procedure note - f/u in 4 wks -- DFE/OCT, possible injection  6,7. Hypertensive retinopathy OU - discussed importance of tight BP control - continue to monitor  8. Pseudophakia OU  - s/p CE/IOL OU  - IOL in good position, doing well  - continue to monitor  9. Ocular Hypertension OD  - IOP today: 21, 17  - on Cosopt BID OU and Latanoprost QHS OU per Dr. Dione Booze  - started Brimonidine bid OD only in March 2024  Ophthalmic Meds Ordered this visit:  Meds ordered this encounter  Medications   Bevacizumab (AVASTIN) SOLN 1.25 mg   Bevacizumab (AVASTIN) SOLN 1.25 mg     Return in about 4 weeks (around 11/28/2022) for f/u PDR OU , DFE, OCT, Possible, IVA, OU.  There are no Patient Instructions on file for this visit.   Explained the diagnoses, plan, and follow up with the patient and they expressed understanding.  Patient expressed understanding of the importance of proper follow up care.   This document serves as a record of services  personally performed by Karie Chimera, MD, PhD. It was created on their behalf by Annalee Genta, COMT. The creation of this record is the provider's dictation and/or activities during the visit.  Electronically signed by: Annalee Genta, COMT 10/31/22 12:03 PM  This document serves as a record of services personally performed by Karie Chimera, MD, PhD. It was created on their behalf by Gerilyn Nestle, COT an ophthalmic technician. The creation of this record is the provider's dictation and/or activities during the visit.    Electronically signed by:  Charlette Caffey, COT  10/31/22 12:03 PM  Karie Chimera, M.D., Ph.D. Diseases & Surgery of the Retina and Vitreous [facility]  I have reviewed the above documentation for accuracy and completeness, and I agree with the above. Karie Chimera, M.D., Ph.D. 10/31/22 12:11 PM  Abbreviations: M myopia (nearsighted); A astigmatism; H hyperopia (farsighted); P presbyopia; Mrx spectacle prescription;  CTL contact lenses; OD right eye; OS left eye; OU both eyes  XT exotropia; ET esotropia; PEK punctate epithelial keratitis; PEE punctate epithelial erosions; DES dry eye syndrome; MGD meibomian gland dysfunction; ATs artificial tears; PFAT's preservative free artificial tears; NSC nuclear sclerotic cataract; PSC posterior subcapsular cataract; ERM epi-retinal membrane; PVD  posterior vitreous detachment; RD retinal detachment; DM diabetes mellitus; DR diabetic retinopathy; NPDR non-proliferative diabetic retinopathy; PDR proliferative diabetic retinopathy; CSME clinically significant macular edema; DME diabetic macular edema; dbh dot blot hemorrhages; CWS cotton wool spot; POAG primary open angle glaucoma; C/D cup-to-disc ratio; HVF humphrey visual field; GVF goldmann visual field; OCT optical coherence tomography; IOP intraocular pressure; BRVO Branch retinal vein occlusion; CRVO central retinal vein occlusion; CRAO central retinal  artery occlusion; BRAO branch retinal artery occlusion; RT retinal tear; SB scleral buckle; PPV pars plana vitrectomy; VH Vitreous hemorrhage; PRP panretinal laser photocoagulation; IVK intravitreal kenalog; VMT vitreomacular traction; MH Macular hole;  NVD neovascularization of the disc; NVE neovascularization elsewhere; AREDS age related eye disease study; ARMD age related macular degeneration; POAG primary open angle glaucoma; EBMD epithelial/anterior basement membrane dystrophy; ACIOL anterior chamber intraocular lens; IOL intraocular lens; PCIOL posterior chamber intraocular lens; Phaco/IOL phacoemulsification with intraocular lens placement; PRK photorefractive keratectomy; LASIK laser assisted in situ keratomileusis; HTN hypertension; DM diabetes mellitus; COPD chronic obstructive pulmonary disease

## 2022-10-26 NOTE — Progress Notes (Signed)
This patient returns to my office for at risk foot care.  This patient requires this care by a professional since this patient will be at risk due to having diabetes.  This patient is unable to cut nails himself since the patient cannot reach his nails.These nails are painful walking and wearing shoes.  She has painful corns on both fifth toes. This patient presents for at risk foot care today.  General Appearance  Alert, conversant and in no acute stress.  Vascular  Dorsalis pedis and posterior tibial  pulses are  weaklypalpable  bilaterally.  Capillary return is within normal limits  bilaterally. Temperature is within normal limits  bilaterally.  Neurologic  Senn-Weinstein monofilament wire test within normal limits  bilaterally. Muscle power within normal limits bilaterally.  Nails Thick disfigured discolored nails with subungual debris  from hallux to fifth toes bilaterally. No evidence of bacterial infection or drainage bilaterally.  Orthopedic  No limitations of motion  feet .  No crepitus or effusions noted.  No bony pathology or digital deformities noted.  Skin  normotropic skin with no porokeratosis noted bilaterally.  No signs of infections or ulcers noted.   [place] durum fifth toes  B/L  Onychomycosis  Pain in right toes  Pain in left toes   HD fifth toes  B/L.  Consent was obtained for treatment procedures.   Mechanical debridement of nails 1-5  bilaterally performed with a nail nipper.  Filed with dremel without incident.    Return office visit     3 months                 Told patient to return for periodic foot care and evaluation due to potential at risk complications.   Helane Gunther DPM

## 2022-10-31 ENCOUNTER — Encounter (INDEPENDENT_AMBULATORY_CARE_PROVIDER_SITE_OTHER): Payer: Self-pay | Admitting: Ophthalmology

## 2022-10-31 ENCOUNTER — Ambulatory Visit (INDEPENDENT_AMBULATORY_CARE_PROVIDER_SITE_OTHER): Payer: Medicare Other | Admitting: Ophthalmology

## 2022-10-31 DIAGNOSIS — E113513 Type 2 diabetes mellitus with proliferative diabetic retinopathy with macular edema, bilateral: Secondary | ICD-10-CM

## 2022-10-31 DIAGNOSIS — Z7984 Long term (current) use of oral hypoglycemic drugs: Secondary | ICD-10-CM | POA: Diagnosis not present

## 2022-10-31 DIAGNOSIS — H35033 Hypertensive retinopathy, bilateral: Secondary | ICD-10-CM

## 2022-10-31 DIAGNOSIS — Z9889 Other specified postprocedural states: Secondary | ICD-10-CM

## 2022-10-31 DIAGNOSIS — H34811 Central retinal vein occlusion, right eye, with macular edema: Secondary | ICD-10-CM

## 2022-10-31 DIAGNOSIS — Z794 Long term (current) use of insulin: Secondary | ICD-10-CM

## 2022-10-31 DIAGNOSIS — I10 Essential (primary) hypertension: Secondary | ICD-10-CM

## 2022-10-31 DIAGNOSIS — Z961 Presence of intraocular lens: Secondary | ICD-10-CM

## 2022-10-31 DIAGNOSIS — H40051 Ocular hypertension, right eye: Secondary | ICD-10-CM

## 2022-10-31 MED ORDER — BEVACIZUMAB CHEMO INJECTION 1.25MG/0.05ML SYRINGE FOR KALEIDOSCOPE
1.2500 mg | INTRAVITREAL | Status: AC | PRN
Start: 2022-10-31 — End: 2022-10-31
  Administered 2022-10-31: 1.25 mg via INTRAVITREAL

## 2022-10-31 MED ORDER — BEVACIZUMAB CHEMO INJECTION 1.25MG/0.05ML SYRINGE FOR KALEIDOSCOPE
1.2500 mg | INTRAVITREAL | Status: AC | PRN
  Administered 2022-10-31: 1.25 mg via INTRAVITREAL

## 2022-11-01 ENCOUNTER — Other Ambulatory Visit: Payer: Medicare Other

## 2022-11-10 ENCOUNTER — Ambulatory Visit
Admission: RE | Admit: 2022-11-10 | Discharge: 2022-11-10 | Disposition: A | Discharge status: Home / Self Care | Payer: Medicare Other | Source: Ambulatory Visit | Attending: Nephrology | Admitting: Nephrology

## 2022-11-10 DIAGNOSIS — N1832 Chronic kidney disease, stage 3b: Secondary | ICD-10-CM

## 2022-11-10 DIAGNOSIS — I129 Hypertensive chronic kidney disease with stage 1 through stage 4 chronic kidney disease, or unspecified chronic kidney disease: Secondary | ICD-10-CM

## 2022-11-28 ENCOUNTER — Encounter (INDEPENDENT_AMBULATORY_CARE_PROVIDER_SITE_OTHER): Payer: Medicare Other | Admitting: Ophthalmology

## 2022-11-28 DIAGNOSIS — E113513 Type 2 diabetes mellitus with proliferative diabetic retinopathy with macular edema, bilateral: Secondary | ICD-10-CM

## 2022-11-28 DIAGNOSIS — H35033 Hypertensive retinopathy, bilateral: Secondary | ICD-10-CM

## 2022-11-28 DIAGNOSIS — H40051 Ocular hypertension, right eye: Secondary | ICD-10-CM

## 2022-11-28 DIAGNOSIS — Z9889 Other specified postprocedural states: Secondary | ICD-10-CM

## 2022-11-28 DIAGNOSIS — H34811 Central retinal vein occlusion, right eye, with macular edema: Secondary | ICD-10-CM

## 2022-11-28 DIAGNOSIS — Z7984 Long term (current) use of oral hypoglycemic drugs: Secondary | ICD-10-CM

## 2022-11-28 DIAGNOSIS — Z961 Presence of intraocular lens: Secondary | ICD-10-CM

## 2022-11-28 DIAGNOSIS — Z794 Long term (current) use of insulin: Secondary | ICD-10-CM

## 2022-11-28 DIAGNOSIS — I10 Essential (primary) hypertension: Secondary | ICD-10-CM

## 2022-11-28 NOTE — Progress Notes (Signed)
[facility] - Clinic Note  12/06/2022     CHIEF COMPLAINT Patient presents for Retina Follow Up   HISTORY OF PRESENT ILLNESS: Ann Gomez is a 72 y.o. female who presents to the clinic today for:   HPI     Retina Follow Up   Patient presents with  Diabetic Retinopathy.  In both eyes.  This started 4 weeks ago.  I, the attending physician,  performed the HPI with the patient and updated documentation appropriately.        Comments   Patient here for 4 weeks retina follow up for PDR OU. CRVO OD. Patient states vision is doing better. No eye pain. OD feels heavy like. Comes and goes. Using drops.       Last edited by [name], MD on 12/06/2022  6:08 PM.      Referring physician: Dot Been, FNP [address] [place],  [place] [address]  HISTORICAL INFORMATION:   Selected notes from the MEDICAL RECORD NUMBER Dr. [name] pt here due to insurance reasons LEE:  Ocular Hx- PMH-    CURRENT MEDICATIONS: Current Outpatient Medications (Ophthalmic Drugs)  Medication Sig   brimonidine (ALPHAGAN) 0.2 % ophthalmic solution Place 1 drop into the right eye 2 (two) times daily.   dorzolamide-timolol (COSOPT) 22.3-6.8 MG/ML ophthalmic solution Place 1 drop into both eyes 2 (two) times daily.   Latanoprostene Bunod (VYZULTA) 0.024 % SOLN Place 1 drop into both eyes at bedtime. QHS OU   latanoprost (XALATAN) 0.005 % ophthalmic solution Place 1 drop into both eyes at bedtime. (Patient not taking: Reported on 12/06/2022)   No current facility-administered medications for this visit. (Ophthalmic Drugs)   Current Outpatient Medications (Other)  Medication Sig   amLODipine (NORVASC) 5 MG tablet Take 1 tablet (5 mg total) by mouth daily.   aspirin EC 81 MG tablet Take 1 tablet (81 mg total) by mouth daily.   Cholecalciferol (VITAMIN D3 PO) Take 1 capsule by mouth daily.   glipiZIDE (GLUCOTROL) 10 MG tablet Take 10 mg by mouth in the morning and at bedtime.    hydrALAZINE (APRESOLINE) 25 MG tablet Take 25 mg by mouth 3 (three) times daily.   LEVEMIR FLEXTOUCH 100 UNIT/ML FlexTouch Pen Inject 17 Units into the skin 2 (two) times daily.   losartan (COZAAR) 100 MG tablet Take 100 mg by mouth daily.   pravastatin (PRAVACHOL) 20 MG tablet Take 20 mg by mouth at bedtime.   chlorthalidone (HYGROTON) 25 MG tablet Take 1 tablet (25 mg total) by mouth daily.   Ginger, Zingiber officinalis, (GINGER ROOT PO) Take 550 mg by mouth daily. (Patient not taking: Reported on 12/06/2022)   mupirocin ointment (BACTROBAN) 2 % APPLY 1 APPLICATION TOPICALLY DAILY (Patient not taking: Reported on 12/06/2022)   No current facility-administered medications for this visit. (Other)   REVIEW OF SYSTEMS: ROS   Positive for: Endocrine, Cardiovascular, Eyes Negative for: Constitutional, Gastrointestinal, Neurological, Skin, Genitourinary, Musculoskeletal, HENT, Respiratory, Psychiatric, Allergic/Imm, Heme/Lymph Last edited by Laddie Aquas, COA on 12/06/2022  1:52 PM.      ALLERGIES Allergies  Allergen Reactions   Hydrochlorothiazide Other (See Comments)    Dizziness   PAST MEDICAL HISTORY Past Medical History:  Diagnosis Date   Diabetes mellitus    Edema    Essential hypertension, benign    Hyperlipidemia    Obesity, unspecified    Past Surgical History:  Procedure Laterality Date   AMPUTATION Right 11/20/2021   Procedure: AMPUTATION GREAT  TOE;  Surgeon: Edwin Cap, DPM;  Location[facility] OR;  Service: Podiatry;  Laterality: Right;   CATARACT EXTRACTION     CESAREAN SECTION     EYE SURGERY     FAMILY HISTORY Family History  Family history unknown: Yes   SOCIAL HISTORY Social History   Tobacco Use   Smoking status: Never   Smokeless tobacco: Never  Vaping Use   Vaping status: Never Used  Substance Use Topics   Alcohol use: Yes    Comment: occasional   Drug use: No       OPHTHALMIC EXAM:  Base Eye Exam     Visual Acuity (Snellen -  Linear)       Right Left   Dist [place] 20/300 -1 20/30 -2   Dist ph [place] 20/250 -1 20/25 -2         Tonometry (Tonopen, 1:49 PM)       Right Left   Pressure 22 17         Pupils       Dark Light Shape React APD   Right 3 2 Round Brisk None   Left 3 2 Round Brisk None         Visual Fields (Counting fingers)       Left Right    Full Full         Extraocular Movement       Right Left    Full, Ortho Full, Ortho         Neuro/Psych     Oriented x3: Yes   Mood/Affect: Normal         Dilation     Both eyes: 1.0% Mydriacyl, 2.5% Phenylephrine @ 1:49 PM           Slit Lamp and Fundus Exam     Slit Lamp Exam       Right Left   Lids/Lashes Dermatochalasis - upper lid Dermatochalasis - upper lid   Conjunctiva/Sclera Melanosis Melanosis   Cornea 3+ Punctate epithelial erosions, well healed cataract wound arcus, trace PEE, mild endo pigment, well healed cataract wound   Anterior Chamber deep, clear, narrow angles deep, clear, narrow angles   Iris Round and dilated Round and dilated   Lens PC IOL in good position with open PC PC IOL in good position   Anterior Vitreous mild syneresis post vitrectomy         Fundus Exam       Right Left   Disc 2-3+Pallor, Sharp rim 2+ Pallor, Sharp rim   C/D Ratio 0.4 0.5   Macula Flat, Blunted foveal reflex, scattered IRH/DBH and exudates, central edema -- slightly improved Flat, Blunted foveal reflex, scattered MA/DBH greatest centrally, central cystic changes / edema -- slightly improved, dense laser temporal macula   Vessels attenuated, Tortuous attenuated, Tortuous   Periphery Attached, scattered MA/DBH and exudates Attached, scattered MA / DBH, scattered patches of PRP with room for fill in           IMAGING AND PROCEDURES  Imaging and Procedures for 12/06/2022  OCT, Retina - OU - Both Eyes       Right Eye Quality was good. Scan locations included subfoveal. Central Foveal Thickness: 205. Progression has  improved. Findings include normal foveal contour, no SRF, intraretinal hyper-reflective material, intraretinal fluid, inner retinal atrophy, vitreomacular adhesion (Interval improvement in CME nasal macula, diffuse IRA).   Left Eye Quality was good. Scan locations included subfoveal. Central Foveal Thickness: 297. Progression has improved. Findings include  no SRF, abnormal foveal contour, subretinal hyper-reflective material, epiretinal membrane, intraretinal fluid, subretinal fluid (Interval improvement in scattered IRF and edema greatest nasal macula).   Notes *Images captured and stored on drive  Diagnosis / Impression:  OD: CRVO -- Interval improvement in CME nasal macula, diffuse IRA OS: +DME -- interval improvement in scattered IRF and edema greatest nasal macula   Clinical management:  See below  Abbreviations: NFP - Normal foveal profile. CME - cystoid macular edema. PED - pigment epithelial detachment. IRF - intraretinal fluid. SRF - subretinal fluid. EZ - ellipsoid zone. ERM - epiretinal membrane. ORA - outer retinal atrophy. ORT - outer retinal tubulation. SRHM - subretinal hyper-reflective material. IRHM - intraretinal hyper-reflective material      Intravitreal Injection, Pharmacologic Agent - OD - Right Eye       Time Out 12/06/2022. 2:40 PM. Confirmed correct patient, procedure, site, and patient consented.   Anesthesia Topical anesthesia was used. Anesthetic medications included Lidocaine 2%, Proparacaine 0.5%.   Procedure Preparation included 5% betadine to ocular surface, eyelid speculum. A supplied (32g) needle was used.   Injection: 1.25 mg Bevacizumab 1.25mg /0.34ml   Route: Intravitreal, Site: Right Eye   NDC: P3213405, Lot: [id number], Expiration date: 01/08/2023   Post-op Post injection exam found visual acuity of at least counting fingers. The patient tolerated the procedure well. There were no complications. The patient received written and verbal post  procedure care education. Post injection medications were not given.      Intravitreal Injection, Pharmacologic Agent - OS - Left Eye       Time Out 12/06/2022. 2:40 PM. Confirmed correct patient, procedure, site, and patient consented.   Anesthesia Topical anesthesia was used. Anesthetic medications included Lidocaine 2%, Proparacaine 0.5%.   Procedure Preparation included 5% betadine to ocular surface, eyelid speculum. A (32g) needle was used.   Injection: 1.25 mg Bevacizumab 1.25mg /0.62ml   Route: Intravitreal, Site: Left Eye   NDC: P3213405, Lot: [id number], Expiration date: 08/16/2023   Post-op Post injection exam found visual acuity of at least counting fingers. The patient tolerated the procedure well. There were no complications. The patient received written and verbal post procedure care education.            ASSESSMENT/PLAN:    ICD-10-CM   1. Central retinal vein occlusion with macular edema of right eye  H34.8110 OCT, Retina - OU - Both Eyes    Intravitreal Injection, Pharmacologic Agent - OD - Right Eye    Bevacizumab (AVASTIN) SOLN 1.25 mg    2. Proliferative diabetic retinopathy of both eyes with macular edema associated with type 2 diabetes mellitus (HCC)  V76.1607 Intravitreal Injection, Pharmacologic Agent - OS - Left Eye    Bevacizumab (AVASTIN) SOLN 1.25 mg    3. Long term (current) use of oral hypoglycemic drugs  Z79.84     4. Encounter for long-term (current) use of insulin (HCC)  Z79.4     5. History of vitrectomy  Z98.890     6. Essential hypertension  I10     7. Hypertensive retinopathy of both eyes  H35.033     8. Pseudophakia, both eyes  Z96.1     9. Ocular hypertension of right eye  H40.051      1. CRVO w/ CME, OD  - delayed f/u -- 3.5 mos instead of 4 wks due to insurance issues (3.19.24 to 07.08.24)  - history of multiple IVA OD w/ [facility] (last 06.06.23)  - s/p IVA OD #1 (02.20.24), #2 (03.19.24) #3 (  07.08.24)  - BCVA OD 20/250 from  20/300  - OCT shows OD: Interval improvement in CME nasal macula, diffuse IRA at 5 wks  - recommend IVA OD #4 today 08.13.24 w/ f/u in 4 wks  - RBA of procedure discussed, questions answered - informed consent obtained and signed  - see procedure note - f/u 4 wks -- DFE/OCT, possible injection  2-5. Proliferative diabetic retinopathy, both eyes  - A1C 9.8 on 02.16.24  - history of PPV w/ membrane peel and laser OS for PDR ([facility] - 06.28.23)  - s/p IVA OU #1 (02.20.24), #2 (03.19.24), #3 (07.08.24) - exam shows scattered MA, DBH and preretinal heme OU; OS with PRP 360 - FA (02.20.24) shows OD: fine NVD; scattered patches of vascular nonperfusion; enlarged FAZ; diffuse leaking MA; OS: Dense cluster of leaking MA's ST mac; staining of PRP laser 360; no active NV - OCT shows OD: Interval improvement in CME nasal macula, diffuse IRA; OS: interval improvement in scattered IRF and edema greatest nasal macula at 5 wks - recommend IVA OU #4 today, 08.13.24 for DME - pt wishes to proceed with injections - RBA of procedure discussed, questions answered - Avastin informed consent obtained and signed OU 02.20.24 - see procedure note - f/u in 4 wks -- DFE/OCT, possible injection  6,7. Hypertensive retinopathy OU - discussed importance of tight BP control - continue to monitor  8. Pseudophakia OU  - s/p CE/IOL OU  - IOL in good position, doing well  - continue to monitor  9. Ocular Hypertension OD  - IOP today: 22, 17  - on Cosopt BID OU and Latanoprost QHS OU per Dr. Dione Booze  - started Brimonidine bid OD only in March 2024  Ophthalmic Meds Ordered this visit:  Meds ordered this encounter  Medications   Bevacizumab (AVASTIN) SOLN 1.25 mg   Bevacizumab (AVASTIN) SOLN 1.25 mg     Return in about 4 weeks (around 01/03/2023) for f/u PDR OU, DFE, OCT, Possible Injxn.  There are no Patient Instructions on file for this visit.   Explained the diagnoses, plan, and follow up with the patient  and they expressed understanding.  Patient expressed understanding of the importance of proper follow up care.   This document serves as a record of services personally performed by Karie Chimera, MD, PhD. It was created on their behalf by Gerilyn Nestle, COT an ophthalmic technician. The creation of this record is the provider's dictation and/or activities during the visit.    Electronically signed by:  Charlette Caffey, COT  12/06/22 6:13 PM  This document serves as a record of services personally performed by Karie Chimera, MD, PhD. It was created on their behalf by Glee Arvin. Manson Passey, OA an ophthalmic technician. The creation of this record is the provider's dictation and/or activities during the visit.    Electronically signed by: Glee Arvin. Manson Passey, OA 12/06/22 6:13 PM  Karie Chimera, M.D., Ph.D. Diseases & Surgery of the Retina and Vitreous [facility]  I have reviewed the above documentation for accuracy and completeness, and I agree with the above. Karie Chimera, M.D., Ph.D. 12/06/22 6:13 PM  Abbreviations: M myopia (nearsighted); A astigmatism; H hyperopia (farsighted); P presbyopia; Mrx spectacle prescription;  CTL contact lenses; OD right eye; OS left eye; OU both eyes  XT exotropia; ET esotropia; PEK punctate epithelial keratitis; PEE punctate epithelial erosions; DES dry eye syndrome; MGD meibomian gland dysfunction; ATs artificial tears; PFAT's preservative free artificial  tears; NSC nuclear sclerotic cataract; PSC posterior subcapsular cataract; ERM epi-retinal membrane; PVD posterior vitreous detachment; RD retinal detachment; DM diabetes mellitus; DR diabetic retinopathy; NPDR non-proliferative diabetic retinopathy; PDR proliferative diabetic retinopathy; CSME clinically significant macular edema; DME diabetic macular edema; dbh dot blot hemorrhages; CWS cotton wool spot; POAG primary open angle glaucoma; C/D cup-to-disc ratio; HVF humphrey visual  field; GVF goldmann visual field; OCT optical coherence tomography; IOP intraocular pressure; BRVO Branch retinal vein occlusion; CRVO central retinal vein occlusion; CRAO central retinal artery occlusion; BRAO branch retinal artery occlusion; RT retinal tear; SB scleral buckle; PPV pars plana vitrectomy; VH Vitreous hemorrhage; PRP panretinal laser photocoagulation; IVK intravitreal kenalog; VMT vitreomacular traction; MH Macular hole;  NVD neovascularization of the disc; NVE neovascularization elsewhere; AREDS age related eye disease study; ARMD age related macular degeneration; POAG primary open angle glaucoma; EBMD epithelial/anterior basement membrane dystrophy; ACIOL anterior chamber intraocular lens; IOL intraocular lens; PCIOL posterior chamber intraocular lens; Phaco/IOL phacoemulsification with intraocular lens placement; PRK photorefractive keratectomy; LASIK laser assisted in situ keratomileusis; HTN hypertension; DM diabetes mellitus; COPD chronic obstructive pulmonary disease

## 2022-12-06 ENCOUNTER — Encounter (INDEPENDENT_AMBULATORY_CARE_PROVIDER_SITE_OTHER): Payer: Self-pay | Admitting: Ophthalmology

## 2022-12-06 ENCOUNTER — Ambulatory Visit (INDEPENDENT_AMBULATORY_CARE_PROVIDER_SITE_OTHER): Payer: Medicare Other | Admitting: Ophthalmology

## 2022-12-06 DIAGNOSIS — Z794 Long term (current) use of insulin: Secondary | ICD-10-CM

## 2022-12-06 DIAGNOSIS — Z7984 Long term (current) use of oral hypoglycemic drugs: Secondary | ICD-10-CM

## 2022-12-06 DIAGNOSIS — Z9889 Other specified postprocedural states: Secondary | ICD-10-CM

## 2022-12-06 DIAGNOSIS — I10 Essential (primary) hypertension: Secondary | ICD-10-CM

## 2022-12-06 DIAGNOSIS — H34811 Central retinal vein occlusion, right eye, with macular edema: Secondary | ICD-10-CM | POA: Diagnosis not present

## 2022-12-06 DIAGNOSIS — E113513 Type 2 diabetes mellitus with proliferative diabetic retinopathy with macular edema, bilateral: Secondary | ICD-10-CM | POA: Diagnosis not present

## 2022-12-06 DIAGNOSIS — Z961 Presence of intraocular lens: Secondary | ICD-10-CM

## 2022-12-06 DIAGNOSIS — H35033 Hypertensive retinopathy, bilateral: Secondary | ICD-10-CM

## 2022-12-06 DIAGNOSIS — H40051 Ocular hypertension, right eye: Secondary | ICD-10-CM

## 2022-12-06 MED ORDER — BEVACIZUMAB CHEMO INJECTION 1.25MG/0.05ML SYRINGE FOR KALEIDOSCOPE
1.2500 mg | INTRAVITREAL | Status: AC | PRN
Start: 2022-12-06 — End: 2022-12-06
  Administered 2022-12-06: 1.25 mg via INTRAVITREAL

## 2023-01-03 ENCOUNTER — Encounter (INDEPENDENT_AMBULATORY_CARE_PROVIDER_SITE_OTHER): Payer: Medicare Other | Admitting: Ophthalmology

## 2023-01-03 DIAGNOSIS — I10 Essential (primary) hypertension: Secondary | ICD-10-CM

## 2023-01-03 DIAGNOSIS — Z961 Presence of intraocular lens: Secondary | ICD-10-CM

## 2023-01-03 DIAGNOSIS — Z7984 Long term (current) use of oral hypoglycemic drugs: Secondary | ICD-10-CM

## 2023-01-03 DIAGNOSIS — H35033 Hypertensive retinopathy, bilateral: Secondary | ICD-10-CM

## 2023-01-03 DIAGNOSIS — H34811 Central retinal vein occlusion, right eye, with macular edema: Secondary | ICD-10-CM

## 2023-01-03 DIAGNOSIS — E113513 Type 2 diabetes mellitus with proliferative diabetic retinopathy with macular edema, bilateral: Secondary | ICD-10-CM

## 2023-01-03 DIAGNOSIS — Z794 Long term (current) use of insulin: Secondary | ICD-10-CM

## 2023-01-03 DIAGNOSIS — H40051 Ocular hypertension, right eye: Secondary | ICD-10-CM

## 2023-01-03 DIAGNOSIS — Z9889 Other specified postprocedural states: Secondary | ICD-10-CM

## 2023-01-06 ENCOUNTER — Ambulatory Visit: Payer: Medicare Other | Attending: Physician Assistant | Admitting: Physician Assistant

## 2023-01-06 NOTE — Progress Notes (Signed)
This encounter was created in error - please disregard.

## 2023-01-09 ENCOUNTER — Inpatient Hospital Stay: Admission: RE | Admit: 2023-01-09 | Payer: Medicare HMO | Source: Ambulatory Visit

## 2023-01-10 ENCOUNTER — Encounter (INDEPENDENT_AMBULATORY_CARE_PROVIDER_SITE_OTHER): Payer: Medicare Other | Admitting: Ophthalmology

## 2023-01-10 DIAGNOSIS — I10 Essential (primary) hypertension: Secondary | ICD-10-CM

## 2023-01-10 DIAGNOSIS — Z961 Presence of intraocular lens: Secondary | ICD-10-CM

## 2023-01-10 DIAGNOSIS — Z794 Long term (current) use of insulin: Secondary | ICD-10-CM

## 2023-01-10 DIAGNOSIS — Z9889 Other specified postprocedural states: Secondary | ICD-10-CM

## 2023-01-10 DIAGNOSIS — H34811 Central retinal vein occlusion, right eye, with macular edema: Secondary | ICD-10-CM

## 2023-01-10 DIAGNOSIS — Z7984 Long term (current) use of oral hypoglycemic drugs: Secondary | ICD-10-CM

## 2023-01-10 DIAGNOSIS — H35033 Hypertensive retinopathy, bilateral: Secondary | ICD-10-CM

## 2023-01-10 DIAGNOSIS — E113513 Type 2 diabetes mellitus with proliferative diabetic retinopathy with macular edema, bilateral: Secondary | ICD-10-CM

## 2023-01-10 DIAGNOSIS — H40051 Ocular hypertension, right eye: Secondary | ICD-10-CM

## 2023-01-19 ENCOUNTER — Encounter (INDEPENDENT_AMBULATORY_CARE_PROVIDER_SITE_OTHER): Payer: Medicare Other | Admitting: Ophthalmology

## 2023-01-19 DIAGNOSIS — I10 Essential (primary) hypertension: Secondary | ICD-10-CM

## 2023-01-19 DIAGNOSIS — Z794 Long term (current) use of insulin: Secondary | ICD-10-CM

## 2023-01-19 DIAGNOSIS — Z9889 Other specified postprocedural states: Secondary | ICD-10-CM

## 2023-01-19 DIAGNOSIS — H35033 Hypertensive retinopathy, bilateral: Secondary | ICD-10-CM

## 2023-01-19 DIAGNOSIS — H34811 Central retinal vein occlusion, right eye, with macular edema: Secondary | ICD-10-CM

## 2023-01-19 DIAGNOSIS — H40051 Ocular hypertension, right eye: Secondary | ICD-10-CM

## 2023-01-19 DIAGNOSIS — Z961 Presence of intraocular lens: Secondary | ICD-10-CM

## 2023-01-19 DIAGNOSIS — E113513 Type 2 diabetes mellitus with proliferative diabetic retinopathy with macular edema, bilateral: Secondary | ICD-10-CM

## 2023-01-19 DIAGNOSIS — Z7984 Long term (current) use of oral hypoglycemic drugs: Secondary | ICD-10-CM

## 2023-01-19 NOTE — Progress Notes (Signed)
[facility] - Clinic Note  01/23/2023     CHIEF COMPLAINT Patient presents for Retina Follow Up   HISTORY OF PRESENT ILLNESS: Ann Gomez is a 72 y.o. female who presents to the clinic today for:   HPI     Retina Follow Up   Patient presents with  Diabetic Retinopathy.  In both eyes.  This started 4 weeks ago.  Duration of 4 weeks.  Since onset it is stable.  I, the attending physician,  performed the HPI with the patient and updated documentation appropriately.        Comments   4 week retina follow up PDR OD and IVA OD pt is reporting no vision changes noticed she denies any flashes or floaters she saw Dr Dione Booze in August her last reading was 103 on Saturday she is using dorz/tim bid ou vyzulta at bedtime ou and brim at bedtime ou       Last edited by [name], MD on 01/23/2023  6:02 PM.    Pt is delayed to follow up from 4 weeks to 6.9 weeks due to transportation, she states vision is stable from prior   Referring physician: Dot Been, FNP [address] [place],  [place] [address]  HISTORICAL INFORMATION:   Selected notes from the MEDICAL RECORD NUMBER Dr. [name] pt here due to insurance reasons LEE:  Ocular Hx- PMH-    CURRENT MEDICATIONS: Current Outpatient Medications (Ophthalmic Drugs)  Medication Sig   brimonidine (ALPHAGAN) 0.2 % ophthalmic solution Place 1 drop into the right eye 2 (two) times daily.   dorzolamide-timolol (COSOPT) 22.3-6.8 MG/ML ophthalmic solution Place 1 drop into both eyes 2 (two) times daily.   latanoprost (XALATAN) 0.005 % ophthalmic solution Place 1 drop into both eyes at bedtime. (Patient not taking: Reported on 12/06/2022)   Latanoprostene Bunod (VYZULTA) 0.024 % SOLN Place 1 drop into both eyes at bedtime. QHS OU   No current facility-administered medications for this visit. (Ophthalmic Drugs)   Current Outpatient Medications (Other)  Medication Sig   amLODipine (NORVASC) 5 MG tablet Take 1  tablet (5 mg total) by mouth daily.   aspirin EC 81 MG tablet Take 1 tablet (81 mg total) by mouth daily.   chlorthalidone (HYGROTON) 25 MG tablet Take 1 tablet (25 mg total) by mouth daily.   Cholecalciferol (VITAMIN D3 PO) Take 1 capsule by mouth daily.   Ginger, Zingiber officinalis, (GINGER ROOT PO) Take 550 mg by mouth daily. (Patient not taking: Reported on 12/06/2022)   glipiZIDE (GLUCOTROL) 10 MG tablet Take 10 mg by mouth in the morning and at bedtime.   hydrALAZINE (APRESOLINE) 25 MG tablet Take 25 mg by mouth 3 (three) times daily.   LEVEMIR FLEXTOUCH 100 UNIT/ML FlexTouch Pen Inject 17 Units into the skin 2 (two) times daily.   losartan (COZAAR) 100 MG tablet Take 100 mg by mouth daily.   mupirocin ointment (BACTROBAN) 2 % APPLY 1 APPLICATION TOPICALLY DAILY (Patient not taking: Reported on 12/06/2022)   pravastatin (PRAVACHOL) 20 MG tablet Take 20 mg by mouth at bedtime.   No current facility-administered medications for this visit. (Other)   REVIEW OF SYSTEMS: ROS   Positive for: Endocrine, Cardiovascular, Eyes Negative for: Constitutional, Gastrointestinal, Neurological, Skin, Genitourinary, Musculoskeletal, HENT, Respiratory, Psychiatric, Allergic/Imm, Heme/Lymph Last edited by Etheleen Mayhew, COT on 01/23/2023  1:35 PM.       ALLERGIES Allergies  Allergen Reactions   Hydrochlorothiazide Other (See Comments)    Dizziness  PAST MEDICAL HISTORY Past Medical History:  Diagnosis Date   Diabetes mellitus    Edema    Essential hypertension, benign    Hyperlipidemia    Obesity, unspecified    Past Surgical History:  Procedure Laterality Date   AMPUTATION Right 11/20/2021   Procedure: AMPUTATION GREAT TOE;  Surgeon: Edwin Cap, DPM;  Location: [facility] OR;  Service: Podiatry;  Laterality: Right;   CATARACT EXTRACTION     CESAREAN SECTION     EYE SURGERY     FAMILY HISTORY Family History  Family history unknown: Yes   SOCIAL HISTORY Social History    Tobacco Use   Smoking status: Never   Smokeless tobacco: Never  Vaping Use   Vaping status: Never Used  Substance Use Topics   Alcohol use: Yes    Comment: occasional   Drug use: No       OPHTHALMIC EXAM:  Base Eye Exam     Visual Acuity (Snellen - Linear)       Right Left   Dist [place] 20/250 20/30 -2   Dist ph [place] NI NI         Tonometry (Tonopen, 1:40 PM)       Right Left   Pressure 23 18         Pupils       Pupils Dark Light Shape React APD   Right PERRL 3 2 Round Brisk None   Left PERRL 3 2 Round Brisk None         Visual Fields       Left Right    Full Full         Extraocular Movement       Right Left    Full, Ortho Full, Ortho         Neuro/Psych     Oriented x3: Yes   Mood/Affect: Normal         Dilation     Both eyes: 2.5% Phenylephrine @ 1:40 PM           Slit Lamp and Fundus Exam     Slit Lamp Exam       Right Left   Lids/Lashes Dermatochalasis - upper lid Dermatochalasis - upper lid   Conjunctiva/Sclera Melanosis Melanosis   Cornea 3+ Punctate epithelial erosions, well healed cataract wound arcus, trace PEE, mild endo pigment, well healed cataract wound   Anterior Chamber deep, clear, narrow angles deep, clear, narrow angles   Iris Round and dilated Round and dilated   Lens PC IOL in good position with open PC PC IOL in good position   Anterior Vitreous mild syneresis post vitrectomy         Fundus Exam       Right Left   Disc 2-3+Pallor, Sharp rim 2+ Pallor, Sharp rim   C/D Ratio 0.4 0.5   Macula Flat, Blunted foveal reflex, scattered IRH/DBH and exudates, central edema -- slightly increased Flat, Blunted foveal reflex, scattered MA/DBH greatest centrally, central cystic changes / edema -- slightly increased, dense laser temporal macula   Vessels attenuated, Tortuous attenuated, Tortuous   Periphery Attached, scattered MA/DBH and exudates Attached, scattered MA / DBH, scattered patches of PRP with room for  fill in           IMAGING AND PROCEDURES  Imaging and Procedures for 01/23/2023  OCT, Retina - OU - Both Eyes       Right Eye Quality was good. Scan locations included subfoveal. Central Foveal Thickness: 194.  Progression has worsened. Findings include normal foveal contour, no SRF, intraretinal hyper-reflective material, intraretinal fluid, inner retinal atrophy, vitreomacular adhesion (Persistent CME nasal macula -- slightly increased, diffuse IRA).   Left Eye Quality was good. Scan locations included subfoveal. Central Foveal Thickness: 310. Progression has worsened. Findings include no SRF, abnormal foveal contour, subretinal hyper-reflective material, epiretinal membrane, intraretinal fluid, subretinal fluid (Persistent IRF and edema greatest superior fovea and macula -- slightly increased).   Notes *Images captured and stored on drive  Diagnosis / Impression:  OD: CRVO -- Persistent CME nasal macula -- slightly increased, diffuse IRA OS: +DME -- Persistent IRF and edema greatest superior fovea and macula -- slightly increased  Clinical management:  See below  Abbreviations: NFP - Normal foveal profile. CME - cystoid macular edema. PED - pigment epithelial detachment. IRF - intraretinal fluid. SRF - subretinal fluid. EZ - ellipsoid zone. ERM - epiretinal membrane. ORA - outer retinal atrophy. ORT - outer retinal tubulation. SRHM - subretinal hyper-reflective material. IRHM - intraretinal hyper-reflective material      Intravitreal Injection, Pharmacologic Agent - OD - Right Eye       Time Out 01/23/2023. 2:11 PM. Confirmed correct patient, procedure, site, and patient consented.   Anesthesia Topical anesthesia was used. Anesthetic medications included Lidocaine 2%, Proparacaine 0.5%.   Procedure Preparation included 5% betadine to ocular surface, eyelid speculum. A supplied (32g) needle was used.   Injection: 1.25 mg Bevacizumab 1.25mg /0.30ml   Route: Intravitreal,  Site: Right Eye   NDC: P3213405, Lot: [id number], Expiration date: 02/27/2023   Post-op Post injection exam found visual acuity of at least counting fingers. The patient tolerated the procedure well. There were no complications. The patient received written and verbal post procedure care education. Post injection medications were not given.      Intravitreal Injection, Pharmacologic Agent - OS - Left Eye       Time Out 01/23/2023. 2:11 PM. Confirmed correct patient, procedure, site, and patient consented.   Anesthesia Topical anesthesia was used. Anesthetic medications included Lidocaine 2%, Proparacaine 0.5%.   Procedure Preparation included 5% betadine to ocular surface, eyelid speculum. A (32g) needle was used.   Injection: 1.25 mg Bevacizumab 1.25mg /0.29ml   Route: Intravitreal, Site: Left Eye   NDC: P3213405, Lot: [id number], Expiration date: 02/28/2023   Post-op Post injection exam found visual acuity of at least counting fingers. The patient tolerated the procedure well. There were no complications. The patient received written and verbal post procedure care education.            ASSESSMENT/PLAN:    ICD-10-CM   1. Central retinal vein occlusion with macular edema of right eye  H34.8110 OCT, Retina - OU - Both Eyes    Intravitreal Injection, Pharmacologic Agent - OD - Right Eye    Bevacizumab (AVASTIN) SOLN 1.25 mg    2. Proliferative diabetic retinopathy of both eyes with macular edema associated with type 2 diabetes mellitus (HCC)  A21.3086 Intravitreal Injection, Pharmacologic Agent - OD - Right Eye    Intravitreal Injection, Pharmacologic Agent - OS - Left Eye    Bevacizumab (AVASTIN) SOLN 1.25 mg    Bevacizumab (AVASTIN) SOLN 1.25 mg    3. Long term (current) use of oral hypoglycemic drugs  Z79.84     4. Encounter for long-term (current) use of insulin (HCC)  Z79.4     5. History of vitrectomy  Z98.890     6. Essential hypertension  I10     7.  Hypertensive retinopathy of both  eyes  H35.033     8. Pseudophakia, both eyes  Z96.1     9. Ocular hypertension of right eye  H40.051      1. CRVO w/ CME, OD  - delayed f/u -- 6.9 wks instead of 4 (08.13.24 to 09.30.24 -- transportation issues)  - delayed f/u -- 3.5 mos instead of 4 wks due to insurance issues (3.19.24 to 07.08.24)  - history of multiple IVA OD w/ [facility] (last 06.06.23)  - s/p IVA OD #1 (02.20.24), #2 (03.19.24) #3 (07.08.24), #4 (08.13.24)  - BCVA OD stable at 20/250  - OCT shows OD: persistent CME nasal macula -- slightly increased, diffuse IRA at 6.9 wks  - recommend IVA OD #5 today 09.30.24 w/ f/u back to 4 wks  - RBA of procedure discussed, questions answered - IVA informed consent obtained and signed, 02.20.24 (OU) - see procedure note - f/u 4 wks -- DFE/OCT, possible injection  2-5. Proliferative diabetic retinopathy, both eyes  - A1C 9.8 on 02.16.24  - history of PPV w/ membrane peel and laser OS for PDR ([facility] - 06.28.23)  - s/p IVA OU #1 (02.20.24), #2 (03.19.24), #3 (07.08.24), #4 (08.13.24) - exam shows scattered MA, DBH and preretinal heme OU; OS with PRP 360 - FA (02.20.24) shows OD: fine NVD; scattered patches of vascular nonperfusion; enlarged FAZ; diffuse leaking MA; OS: Dense cluster of leaking MA's ST mac; staining of PRP laser 360; no active NV - OCT shows OD: CRVO -- Persistent CME nasal macula -- slightly increased, diffuse IRA; OS: +DME -- Persistent IRF and edema greatest superior fovea and macula -- slightly increased at 6.9 wks - recommend IVA OU #5 today, 09.30.24 for DME w/ f/u in 4 wks - pt wishes to proceed with injections - RBA of procedure discussed, questions answered - Avastin informed consent obtained and signed OU 02.20.24 - see procedure note - f/u in 4 wks -- DFE/OCT, possible injection  6,7. Hypertensive retinopathy OU - discussed importance of tight BP control - continue to monitor  8. Pseudophakia OU  - s/p CE/IOL  OU  - IOL in good position, doing well  - continue to monitor  9. Ocular Hypertension OD  - IOP today: 23,18  - on Cosopt BID OU and Latanoprost QHS OU per Dr. Dione Booze  - started brimonidine bid OD only in March 2024  Ophthalmic Meds Ordered this visit:  Meds ordered this encounter  Medications   Bevacizumab (AVASTIN) SOLN 1.25 mg   Bevacizumab (AVASTIN) SOLN 1.25 mg     Return in about 4 weeks (around 02/20/2023) for f/u PDR OU, DFE, OCT.  There are no Patient Instructions on file for this visit.   Explained the diagnoses, plan, and follow up with the patient and they expressed understanding.  Patient expressed understanding of the importance of proper follow up care.   This document serves as a record of services personally performed by Karie Chimera, MD, PhD. It was created on their behalf by Annalee Genta, COMT. The creation of this record is the provider's dictation and/or activities during the visit.  Electronically signed by: Annalee Genta, COMT 01/26/23 4:17 PM  This document serves as a record of services personally performed by Karie Chimera, MD, PhD. It was created on their behalf by Glee Arvin. Manson Passey, OA an ophthalmic technician. The creation of this record is the provider's dictation and/or activities during the visit.    Electronically signed by: Glee Arvin. Manson Passey, OA 01/26/23 4:17 PM  Isaias Cowman.  Vanessa Barbara, M.D., Ph.D. Diseases & Surgery of the Retina and Vitreous [facility]  I have reviewed the above documentation for accuracy and completeness, and I agree with the above. Karie Chimera, M.D., Ph.D. 01/26/23 4:17 PM  Abbreviations: M myopia (nearsighted); A astigmatism; H hyperopia (farsighted); P presbyopia; Mrx spectacle prescription;  CTL contact lenses; OD right eye; OS left eye; OU both eyes  XT exotropia; ET esotropia; PEK punctate epithelial keratitis; PEE punctate epithelial erosions; DES dry eye syndrome; MGD meibomian gland dysfunction;  ATs artificial tears; PFAT's preservative free artificial tears; NSC nuclear sclerotic cataract; PSC posterior subcapsular cataract; ERM epi-retinal membrane; PVD posterior vitreous detachment; RD retinal detachment; DM diabetes mellitus; DR diabetic retinopathy; NPDR non-proliferative diabetic retinopathy; PDR proliferative diabetic retinopathy; CSME clinically significant macular edema; DME diabetic macular edema; dbh dot blot hemorrhages; CWS cotton wool spot; POAG primary open angle glaucoma; C/D cup-to-disc ratio; HVF humphrey visual field; GVF goldmann visual field; OCT optical coherence tomography; IOP intraocular pressure; BRVO Branch retinal vein occlusion; CRVO central retinal vein occlusion; CRAO central retinal artery occlusion; BRAO branch retinal artery occlusion; RT retinal tear; SB scleral buckle; PPV pars plana vitrectomy; VH Vitreous hemorrhage; PRP panretinal laser photocoagulation; IVK intravitreal kenalog; VMT vitreomacular traction; MH Macular hole;  NVD neovascularization of the disc; NVE neovascularization elsewhere; AREDS age related eye disease study; ARMD age related macular degeneration; POAG primary open angle glaucoma; EBMD epithelial/anterior basement membrane dystrophy; ACIOL anterior chamber intraocular lens; IOL intraocular lens; PCIOL posterior chamber intraocular lens; Phaco/IOL phacoemulsification with intraocular lens placement; PRK photorefractive keratectomy; LASIK laser assisted in situ keratomileusis; HTN hypertension; DM diabetes mellitus; COPD chronic obstructive pulmonary disease

## 2023-01-23 ENCOUNTER — Ambulatory Visit (INDEPENDENT_AMBULATORY_CARE_PROVIDER_SITE_OTHER): Payer: Medicare Other | Admitting: Ophthalmology

## 2023-01-23 ENCOUNTER — Encounter (INDEPENDENT_AMBULATORY_CARE_PROVIDER_SITE_OTHER): Payer: Self-pay | Admitting: Ophthalmology

## 2023-01-23 DIAGNOSIS — Z961 Presence of intraocular lens: Secondary | ICD-10-CM

## 2023-01-23 DIAGNOSIS — H35033 Hypertensive retinopathy, bilateral: Secondary | ICD-10-CM

## 2023-01-23 DIAGNOSIS — Z794 Long term (current) use of insulin: Secondary | ICD-10-CM

## 2023-01-23 DIAGNOSIS — E113513 Type 2 diabetes mellitus with proliferative diabetic retinopathy with macular edema, bilateral: Secondary | ICD-10-CM | POA: Diagnosis not present

## 2023-01-23 DIAGNOSIS — H34811 Central retinal vein occlusion, right eye, with macular edema: Secondary | ICD-10-CM | POA: Diagnosis not present

## 2023-01-23 DIAGNOSIS — Z7984 Long term (current) use of oral hypoglycemic drugs: Secondary | ICD-10-CM

## 2023-01-23 DIAGNOSIS — I10 Essential (primary) hypertension: Secondary | ICD-10-CM

## 2023-01-23 DIAGNOSIS — Z9889 Other specified postprocedural states: Secondary | ICD-10-CM

## 2023-01-23 DIAGNOSIS — H40051 Ocular hypertension, right eye: Secondary | ICD-10-CM

## 2023-01-23 MED ORDER — BEVACIZUMAB CHEMO INJECTION 1.25MG/0.05ML SYRINGE FOR KALEIDOSCOPE
1.2500 mg | INTRAVITREAL | Status: AC | PRN
Start: 2023-01-23 — End: 2023-01-23
  Administered 2023-01-23: 1.25 mg via INTRAVITREAL

## 2023-01-26 ENCOUNTER — Ambulatory Visit: Payer: Medicare Other | Admitting: Podiatry

## 2023-02-01 ENCOUNTER — Ambulatory Visit (INDEPENDENT_AMBULATORY_CARE_PROVIDER_SITE_OTHER): Payer: Medicare Other | Admitting: Podiatry

## 2023-02-01 ENCOUNTER — Encounter: Payer: Self-pay | Admitting: Podiatry

## 2023-02-01 DIAGNOSIS — B351 Tinea unguium: Secondary | ICD-10-CM

## 2023-02-01 DIAGNOSIS — E1142 Type 2 diabetes mellitus with diabetic polyneuropathy: Secondary | ICD-10-CM

## 2023-02-01 DIAGNOSIS — L84 Corns and callosities: Secondary | ICD-10-CM | POA: Diagnosis not present

## 2023-02-01 DIAGNOSIS — Z89421 Acquired absence of other right toe(s): Secondary | ICD-10-CM

## 2023-02-01 DIAGNOSIS — M79675 Pain in left toe(s): Secondary | ICD-10-CM

## 2023-02-01 DIAGNOSIS — M79674 Pain in right toe(s): Secondary | ICD-10-CM | POA: Diagnosis not present

## 2023-02-01 NOTE — Progress Notes (Addendum)
This patient returns to my office for at risk foot care.  This patient requires this care by a professional since this patient will be at risk due to having diabetes.  This patient is unable to cut nails himself since the patient cannot reach his nails.These nails are painful walking and wearing shoes.  She has painful corns on both fifth toes. This patient presents for at risk foot care today.  General Appearance  Alert, conversant and in no acute stress.  Vascular  Dorsalis pedis and posterior tibial  pulses are  weaklypalpable  bilaterally.  Capillary return is within normal limits  bilaterally. Temperature is within normal limits  bilaterally.  Neurologic  Senn-Weinstein monofilament wire test diminished   bilaterally. Muscle power within normal limits bilaterally.  Nails Thick disfigured discolored nails with subungual debris  from hallux to fifth toes bilaterally. No evidence of bacterial infection or drainage bilaterally.  Orthopedic  No limitations of motion  feet .  No crepitus or effusions noted.  No bony pathology or digital deformities noted.  Skin  normotropic skin with no porokeratosis noted bilaterally.  No signs of infections or ulcers noted.   [place] durum fifth toes  B/L  Onychomycosis  Pain in right toes  Pain in left toes   HD fifth toes  B/L.  Consent was obtained for treatment procedures.   Mechanical debridement of nails 1-5  bilaterally performed with a nail nipper.  Filed with dremel without incident.  Debride corns fifth toe both feet with # 15 blade. Check into diabetic shoes.   Return office visit     3 months                 Told patient to return for periodic foot care and evaluation due to potential at risk complications.   Helane Gunther DPM

## 2023-02-20 ENCOUNTER — Encounter (INDEPENDENT_AMBULATORY_CARE_PROVIDER_SITE_OTHER): Payer: Medicare Other | Admitting: Ophthalmology

## 2023-02-20 DIAGNOSIS — I10 Essential (primary) hypertension: Secondary | ICD-10-CM

## 2023-02-20 DIAGNOSIS — H40051 Ocular hypertension, right eye: Secondary | ICD-10-CM

## 2023-02-20 DIAGNOSIS — H34811 Central retinal vein occlusion, right eye, with macular edema: Secondary | ICD-10-CM

## 2023-02-20 DIAGNOSIS — E113513 Type 2 diabetes mellitus with proliferative diabetic retinopathy with macular edema, bilateral: Secondary | ICD-10-CM

## 2023-02-20 DIAGNOSIS — Z961 Presence of intraocular lens: Secondary | ICD-10-CM

## 2023-02-20 DIAGNOSIS — Z7984 Long term (current) use of oral hypoglycemic drugs: Secondary | ICD-10-CM

## 2023-02-20 DIAGNOSIS — Z794 Long term (current) use of insulin: Secondary | ICD-10-CM

## 2023-02-20 DIAGNOSIS — H35033 Hypertensive retinopathy, bilateral: Secondary | ICD-10-CM

## 2023-02-20 DIAGNOSIS — Z9889 Other specified postprocedural states: Secondary | ICD-10-CM

## 2023-02-21 NOTE — Progress Notes (Signed)
[facility] - Clinic Note  02/22/2023     CHIEF COMPLAINT Patient presents for Retina Follow Up   HISTORY OF PRESENT ILLNESS: Ann Gomez is a 72 y.o. female who presents to the clinic today for:   HPI     Retina Follow Up   Patient presents with  Diabetic Retinopathy.  In both eyes.  This started 4 weeks ago.  Duration of 4 weeks.  Since onset it is stable.  I, the attending physician,  performed the HPI with the patient and updated documentation appropriately.        Comments   Patient feels the vision is the same. She states the left eye feels like there is something in the eye at times. She is using Brimonidine OD BID, Cosopt OD BID, and Xalatan OU at bedtime. Her blood sugar was 107.      Last edited by [name], MD on 02/22/2023  4:57 PM.       Referring physician: Dot Been, FNP [address] [place],  [place] [address]  HISTORICAL INFORMATION:   Selected notes from the MEDICAL RECORD NUMBER Dr. [name] pt here due to insurance reasons LEE:  Ocular Hx- PMH-    CURRENT MEDICATIONS: Current Outpatient Medications (Ophthalmic Drugs)  Medication Sig   brimonidine (ALPHAGAN) 0.2 % ophthalmic solution Place 1 drop into the right eye 2 (two) times daily.   dorzolamide-timolol (COSOPT) 22.3-6.8 MG/ML ophthalmic solution Place 1 drop into both eyes 2 (two) times daily.   latanoprost (XALATAN) 0.005 % ophthalmic solution Place 1 drop into both eyes at bedtime.   Latanoprostene Bunod (VYZULTA) 0.024 % SOLN Place 1 drop into both eyes at bedtime. QHS OU   No current facility-administered medications for this visit. (Ophthalmic Drugs)   Current Outpatient Medications (Other)  Medication Sig   amLODipine (NORVASC) 5 MG tablet Take 1 tablet (5 mg total) by mouth daily.   aspirin EC 81 MG tablet Take 1 tablet (81 mg total) by mouth daily.   Cholecalciferol (VITAMIN D3 PO) Take 1 capsule by mouth daily.   Ginger, Zingiber officinalis,  (GINGER ROOT PO) Take 550 mg by mouth daily.   glipiZIDE (GLUCOTROL) 10 MG tablet Take 10 mg by mouth in the morning and at bedtime.   hydrALAZINE (APRESOLINE) 25 MG tablet Take 25 mg by mouth 3 (three) times daily.   LEVEMIR FLEXTOUCH 100 UNIT/ML FlexTouch Pen Inject 17 Units into the skin 2 (two) times daily.   losartan (COZAAR) 100 MG tablet Take 100 mg by mouth daily.   mupirocin ointment (BACTROBAN) 2 % APPLY 1 APPLICATION TOPICALLY DAILY   pravastatin (PRAVACHOL) 20 MG tablet Take 20 mg by mouth at bedtime.   chlorthalidone (HYGROTON) 25 MG tablet Take 1 tablet (25 mg total) by mouth daily.   No current facility-administered medications for this visit. (Other)   REVIEW OF SYSTEMS: ROS   Positive for: Endocrine, Cardiovascular, Eyes Negative for: Constitutional, Gastrointestinal, Neurological, Skin, Genitourinary, Musculoskeletal, HENT, Respiratory, Psychiatric, Allergic/Imm, Heme/Lymph Last edited by Charlette Caffey, COT on 02/22/2023  1:48 PM.        ALLERGIES Allergies  Allergen Reactions   Hydrochlorothiazide Other (See Comments)    Dizziness   PAST MEDICAL HISTORY Past Medical History:  Diagnosis Date   Diabetes mellitus    Edema    Essential hypertension, benign    Hyperlipidemia    Obesity, unspecified    Past Surgical History:  Procedure Laterality Date   AMPUTATION Right 11/20/2021  Procedure: AMPUTATION GREAT TOE;  Surgeon: Edwin Cap, DPM;  Location: [facility] OR;  Service: Podiatry;  Laterality: Right;   CATARACT EXTRACTION     CESAREAN SECTION     EYE SURGERY     FAMILY HISTORY Family History  Family history unknown: Yes   SOCIAL HISTORY Social History   Tobacco Use   Smoking status: Never   Smokeless tobacco: Never  Vaping Use   Vaping status: Never Used  Substance Use Topics   Alcohol use: Yes    Comment: occasional   Drug use: No       OPHTHALMIC EXAM:  Base Eye Exam     Visual Acuity (Snellen - Linear)       Right Left    Dist [place] 20/250 20/25   Dist ph [place] NI NI         Tonometry (Tonopen, 1:53 PM)       Right Left   Pressure 23 22         Pupils       Dark Light Shape React APD   Right 3 2 Round Brisk None   Left 3 2 Round Brisk None         Visual Fields       Left Right    Full Full         Extraocular Movement       Right Left    Full, Ortho Full, Ortho         Neuro/Psych     Oriented x3: Yes   Mood/Affect: Normal         Dilation     Both eyes: 1.0% Mydriacyl, 2.5% Phenylephrine @ 1:49 PM           Slit Lamp and Fundus Exam     Slit Lamp Exam       Right Left   Lids/Lashes Dermatochalasis - upper lid Dermatochalasis - upper lid   Conjunctiva/Sclera Melanosis Melanosis   Cornea 3+ Punctate epithelial erosions, well healed cataract wound arcus, trace PEE, mild endo pigment, well healed cataract wound   Anterior Chamber deep, clear, narrow angles deep, clear, narrow angles   Iris Round and dilated Round and dilated   Lens PC IOL in good position with open PC PC IOL in good position   Anterior Vitreous mild syneresis post vitrectomy         Fundus Exam       Right Left   Disc 2-3+Pallor, Sharp rim 2+ Pallor, Sharp rim   C/D Ratio 0.4 0.5   Macula Flat, Blunted foveal reflex, scattered IRH/DBH and exudates, scattered cystic changes -- slightly improved, prominent DBH temporal macula Flat, Blunted foveal reflex, scattered MA/DBH greatest temporally, central cystic changes / edema -- slightly increased, dense laser temporal macula   Vessels attenuated, Tortuous attenuated, Tortuous   Periphery Attached, scattered MA/DBH and exudates Attached, scattered MA / DBH, scattered patches of PRP with room for fill in           IMAGING AND PROCEDURES  Imaging and Procedures for 02/22/2023  OCT, Retina - OU - Both Eyes       Right Eye Quality was good. Scan locations included subfoveal. Central Foveal Thickness: 194. Progression has improved. Findings  include normal foveal contour, no SRF, intraretinal hyper-reflective material, intraretinal fluid, inner retinal atrophy, vitreomacular adhesion (Scattered cystic changes -- slightly improved, diffuse IRA).   Left Eye Quality was good. Scan locations included subfoveal. Central Foveal Thickness: 352. Progression has been  stable. Findings include no SRF, abnormal foveal contour, subretinal hyper-reflective material, epiretinal membrane, intraretinal fluid, subretinal fluid (Persistent cystic changes -- slightly increased centrally).   Notes *Images captured and stored on drive  Diagnosis / Impression:  OD: CRVO -- Scattered cystic changes -- slightly improved, diffuse IRA OS: +DME -- Persistent cystic changes -- slightly increased centrally  Clinical management:  See below  Abbreviations: NFP - Normal foveal profile. CME - cystoid macular edema. PED - pigment epithelial detachment. IRF - intraretinal fluid. SRF - subretinal fluid. EZ - ellipsoid zone. ERM - epiretinal membrane. ORA - outer retinal atrophy. ORT - outer retinal tubulation. SRHM - subretinal hyper-reflective material. IRHM - intraretinal hyper-reflective material      Intravitreal Injection, Pharmacologic Agent - OD - Right Eye       Time Out 02/22/2023. 2:15 PM. Confirmed correct patient, procedure, site, and patient consented.   Anesthesia Topical anesthesia was used. Anesthetic medications included Lidocaine 2%, Proparacaine 0.5%.   Procedure Preparation included 5% betadine to ocular surface, eyelid speculum. A supplied (32g) needle was used.   Injection: 1.25 mg Bevacizumab 1.25mg /0.42ml   Route: Intravitreal, Site: Right Eye   NDC: P3213405, Lot: [id number], Expiration date: 05/20/2023   Post-op Post injection exam found visual acuity of at least counting fingers. The patient tolerated the procedure well. There were no complications. The patient received written and verbal post procedure care education. Post  injection medications were not given.      Intravitreal Injection, Pharmacologic Agent - OS - Left Eye       Time Out 02/22/2023. 2:15 PM. Confirmed correct patient, procedure, site, and patient consented.   Anesthesia Topical anesthesia was used. Anesthetic medications included Lidocaine 2%, Proparacaine 0.5%.   Procedure Preparation included 5% betadine to ocular surface, eyelid speculum. A (32g) needle was used.   Injection: 1.25 mg Bevacizumab 1.25mg /0.80ml   Route: Intravitreal, Site: Left Eye   NDC: P3213405, Lot: [id number], Expiration date: 04/23/2023   Post-op Post injection exam found visual acuity of at least counting fingers. The patient tolerated the procedure well. There were no complications. The patient received written and verbal post procedure care education.            ASSESSMENT/PLAN:    ICD-10-CM   1. Central retinal vein occlusion with macular edema of right eye  H34.8110 OCT, Retina - OU - Both Eyes    Intravitreal Injection, Pharmacologic Agent - OD - Right Eye    Bevacizumab (AVASTIN) SOLN 1.25 mg    2. Proliferative diabetic retinopathy of both eyes with macular edema associated with type 2 diabetes mellitus (HCC)  O96.2952 Intravitreal Injection, Pharmacologic Agent - OS - Left Eye    Bevacizumab (AVASTIN) SOLN 1.25 mg    3. Long term (current) use of oral hypoglycemic drugs  Z79.84     4. Encounter for long-term (current) use of insulin (HCC)  Z79.4     5. History of vitrectomy  Z98.890     6. Essential hypertension  I10     7. Hypertensive retinopathy of both eyes  H35.033     8. Pseudophakia, both eyes  Z96.1     9. Ocular hypertension of right eye  H40.051      1. CRVO w/ CME, OD  - delayed f/u -- 6.9 wks instead of 4 (08.13.24 to 09.30.24 -- transportation issues)  - delayed f/u -- 3.5 mos instead of 4 wks due to insurance issues (3.19.24 to 07.08.24)  - history of multiple IVA OD w/ [facility] (  last 06.06.23)  - s/p IVA OD #1  (02.20.24), #2 (03.19.24) #3 (07.08.24), #4 (08.13.24), #5 (09.30.24)  - BCVA OD stable at 20/250  - OCT shows OD: scattered cystic changes -- slightly improved, diffuse IRA at 4 wks  - recommend IVA OD #6 today 10.30.24 w/ f/u in 4 wks  - RBA of procedure discussed, questions answered - IVA informed consent obtained and signed, 02.20.24 (OU) - see procedure note - f/u 4 wks -- DFE/OCT, possible injection  2-5. Proliferative diabetic retinopathy, both eyes  - A1C 9.8 on 02.16.24  - history of PPV w/ membrane peel and laser OS for PDR ([facility] - 06.28.23)  - s/p IVA OU #1 (02.20.24), #2 (03.19.24), #3 (07.08.24), #4 (08.13.24), #5 (09.30.24) - exam shows scattered MA, DBH and preretinal heme OU; OS with PRP 360 - FA (02.20.24) shows OD: fine NVD; scattered patches of vascular nonperfusion; enlarged FAZ; diffuse leaking MA; OS: Dense cluster of leaking MA's ST mac; staining of PRP laser 360; no active NV - OCT shows OD: CRVO -- Scattered cystic changes -- slightly improved, diffuse IRA; OS: +DME -- Persistent cystic changes -- slightly increased centrally at 4 weeks - recommend IVA OU #6 today, 10.30.24 for DME w/ f/u in 4 wks again - pt wishes to proceed with injections - RBA of procedure discussed, questions answered - Avastin informed consent obtained and signed OU 02.20.24 - see procedure note - f/u in 4 wks -- DFE/OCT, possible injection  6,7. Hypertensive retinopathy OU - discussed importance of tight BP control - continue to monitor  8. Pseudophakia OU  - s/p CE/IOL OU  - IOL in good position, doing well  - continue to monitor  9. Ocular Hypertension OD  - IOP today: 23,22  - on Cosopt BID OU and Latanoprost QHS OU per Dr. Dione Booze  - started brimonidine bid OD only in March 2024  Ophthalmic Meds Ordered this visit:  Meds ordered this encounter  Medications   Bevacizumab (AVASTIN) SOLN 1.25 mg   Bevacizumab (AVASTIN) SOLN 1.25 mg     Return in about 4 weeks (around  03/22/2023) for f/u PDR OU, DFE, OCT.  There are no Patient Instructions on file for this visit.   Explained the diagnoses, plan, and follow up with the patient and they expressed understanding.  Patient expressed understanding of the importance of proper follow up care.   This document serves as a record of services personally performed by Karie Chimera, MD, PhD. It was created on their behalf by De Blanch, an ophthalmic technician. The creation of this record is the provider's dictation and/or activities during the visit.    Electronically signed by: De Blanch, OA, 02/22/23  11:51 PM  This document serves as a record of services personally performed by Karie Chimera, MD, PhD. It was created on their behalf by Glee Arvin. Manson Passey, OA an ophthalmic technician. The creation of this record is the provider's dictation and/or activities during the visit.    Electronically signed by: Glee Arvin. Manson Passey, OA 02/22/23 11:51 PM  Karie Chimera, M.D., Ph.D. Diseases & Surgery of the Retina and Vitreous [facility]  I have reviewed the above documentation for accuracy and completeness, and I agree with the above. Karie Chimera, M.D., Ph.D. 02/22/23 11:56 PM  Abbreviations: M myopia (nearsighted); A astigmatism; H hyperopia (farsighted); P presbyopia; Mrx spectacle prescription;  CTL contact lenses; OD right eye; OS left eye; OU both eyes  XT exotropia; ET esotropia;  PEK punctate epithelial keratitis; PEE punctate epithelial erosions; DES dry eye syndrome; MGD meibomian gland dysfunction; ATs artificial tears; PFAT's preservative free artificial tears; NSC nuclear sclerotic cataract; PSC posterior subcapsular cataract; ERM epi-retinal membrane; PVD posterior vitreous detachment; RD retinal detachment; DM diabetes mellitus; DR diabetic retinopathy; NPDR non-proliferative diabetic retinopathy; PDR proliferative diabetic retinopathy; CSME clinically significant macular  edema; DME diabetic macular edema; dbh dot blot hemorrhages; CWS cotton wool spot; POAG primary open angle glaucoma; C/D cup-to-disc ratio; HVF humphrey visual field; GVF goldmann visual field; OCT optical coherence tomography; IOP intraocular pressure; BRVO Branch retinal vein occlusion; CRVO central retinal vein occlusion; CRAO central retinal artery occlusion; BRAO branch retinal artery occlusion; RT retinal tear; SB scleral buckle; PPV pars plana vitrectomy; VH Vitreous hemorrhage; PRP panretinal laser photocoagulation; IVK intravitreal kenalog; VMT vitreomacular traction; MH Macular hole;  NVD neovascularization of the disc; NVE neovascularization elsewhere; AREDS age related eye disease study; ARMD age related macular degeneration; POAG primary open angle glaucoma; EBMD epithelial/anterior basement membrane dystrophy; ACIOL anterior chamber intraocular lens; IOL intraocular lens; PCIOL posterior chamber intraocular lens; Phaco/IOL phacoemulsification with intraocular lens placement; PRK photorefractive keratectomy; LASIK laser assisted in situ keratomileusis; HTN hypertension; DM diabetes mellitus; COPD chronic obstructive pulmonary disease

## 2023-02-22 ENCOUNTER — Encounter (INDEPENDENT_AMBULATORY_CARE_PROVIDER_SITE_OTHER): Payer: Self-pay | Admitting: Ophthalmology

## 2023-02-22 ENCOUNTER — Ambulatory Visit (INDEPENDENT_AMBULATORY_CARE_PROVIDER_SITE_OTHER): Payer: Medicare Other | Admitting: Ophthalmology

## 2023-02-22 DIAGNOSIS — Z9889 Other specified postprocedural states: Secondary | ICD-10-CM

## 2023-02-22 DIAGNOSIS — H34811 Central retinal vein occlusion, right eye, with macular edema: Secondary | ICD-10-CM | POA: Diagnosis not present

## 2023-02-22 DIAGNOSIS — Z794 Long term (current) use of insulin: Secondary | ICD-10-CM

## 2023-02-22 DIAGNOSIS — I10 Essential (primary) hypertension: Secondary | ICD-10-CM

## 2023-02-22 DIAGNOSIS — Z961 Presence of intraocular lens: Secondary | ICD-10-CM

## 2023-02-22 DIAGNOSIS — H40051 Ocular hypertension, right eye: Secondary | ICD-10-CM

## 2023-02-22 DIAGNOSIS — E113513 Type 2 diabetes mellitus with proliferative diabetic retinopathy with macular edema, bilateral: Secondary | ICD-10-CM

## 2023-02-22 DIAGNOSIS — Z7984 Long term (current) use of oral hypoglycemic drugs: Secondary | ICD-10-CM

## 2023-02-22 DIAGNOSIS — H35033 Hypertensive retinopathy, bilateral: Secondary | ICD-10-CM

## 2023-02-22 MED ORDER — BEVACIZUMAB CHEMO INJECTION 1.25MG/0.05ML SYRINGE FOR KALEIDOSCOPE
1.2500 mg | INTRAVITREAL | Status: AC | PRN
  Administered 2023-02-22: 1.25 mg via INTRAVITREAL

## 2023-02-24 ENCOUNTER — Other Ambulatory Visit: Payer: Self-pay | Admitting: Family Medicine

## 2023-02-24 DIAGNOSIS — Z13820 Encounter for screening for osteoporosis: Secondary | ICD-10-CM

## 2023-03-17 LAB — LAB REPORT - SCANNED: EGFR: 33

## 2023-03-22 ENCOUNTER — Encounter (INDEPENDENT_AMBULATORY_CARE_PROVIDER_SITE_OTHER): Payer: Medicare Other | Admitting: Ophthalmology

## 2023-03-22 DIAGNOSIS — Z9889 Other specified postprocedural states: Secondary | ICD-10-CM

## 2023-03-22 DIAGNOSIS — Z794 Long term (current) use of insulin: Secondary | ICD-10-CM

## 2023-03-22 DIAGNOSIS — H35033 Hypertensive retinopathy, bilateral: Secondary | ICD-10-CM

## 2023-03-22 DIAGNOSIS — Z7984 Long term (current) use of oral hypoglycemic drugs: Secondary | ICD-10-CM

## 2023-03-22 DIAGNOSIS — Z961 Presence of intraocular lens: Secondary | ICD-10-CM

## 2023-03-22 DIAGNOSIS — H40051 Ocular hypertension, right eye: Secondary | ICD-10-CM

## 2023-03-22 DIAGNOSIS — I10 Essential (primary) hypertension: Secondary | ICD-10-CM

## 2023-03-22 DIAGNOSIS — E113513 Type 2 diabetes mellitus with proliferative diabetic retinopathy with macular edema, bilateral: Secondary | ICD-10-CM

## 2023-03-22 DIAGNOSIS — H34811 Central retinal vein occlusion, right eye, with macular edema: Secondary | ICD-10-CM

## 2023-03-30 NOTE — Progress Notes (Signed)
[facility] - Clinic Note  03/31/2023     CHIEF COMPLAINT Patient presents for Retina Follow Up   HISTORY OF PRESENT ILLNESS: Ann Gomez is a 72 y.o. female who presents to the clinic today for:   HPI     Retina Follow Up   Patient presents with  Diabetic Retinopathy.  In both eyes.  This started 4 weeks ago.  Duration of 4 weeks.  Since onset it is stable.  I, the attending physician,  performed the HPI with the patient and updated documentation appropriately.        Comments   4 week retina follow up PDR and IVA OD pt is reporting no vision changes noticed she denies any flashes has some floaters at times her last reading 112      Last edited by Ann Chris, MD on 03/31/2023  4:37 PM.    Patient feels the vision is improving.  Referring physician: Dot Been, FNP [address] [place],  [place] [address]  HISTORICAL INFORMATION:   Selected notes from the MEDICAL RECORD NUMBER Dr. Luciana Gomez pt here due to insurance reasons LEE:  Ocular Hx- PMH-    CURRENT MEDICATIONS: Current Outpatient Medications (Ophthalmic Drugs)  Medication Sig   brimonidine (ALPHAGAN) 0.2 % ophthalmic solution Place 1 drop into the right eye 2 (two) times daily.   dorzolamide-timolol (COSOPT) 22.3-6.8 MG/ML ophthalmic solution Place 1 drop into both eyes 2 (two) times daily.   latanoprost (XALATAN) 0.005 % ophthalmic solution Place 1 drop into both eyes at bedtime.   Latanoprostene Bunod (VYZULTA) 0.024 % SOLN Place 1 drop into both eyes at bedtime. QHS OU   No current facility-administered medications for this visit. (Ophthalmic Drugs)   Current Outpatient Medications (Other)  Medication Sig   amLODipine (NORVASC) 5 MG tablet Take 1 tablet (5 mg total) by mouth daily.   aspirin EC 81 MG tablet Take 1 tablet (81 mg total) by mouth daily.   chlorthalidone (HYGROTON) 25 MG tablet Take 1 tablet (25 mg total) by mouth daily.   Cholecalciferol (VITAMIN D3 PO) Take  1 capsule by mouth daily.   Ginger, Zingiber officinalis, (GINGER ROOT PO) Take 550 mg by mouth daily.   glipiZIDE (GLUCOTROL) 10 MG tablet Take 10 mg by mouth in the morning and at bedtime.   hydrALAZINE (APRESOLINE) 25 MG tablet Take 25 mg by mouth 3 (three) times daily.   LEVEMIR FLEXTOUCH 100 UNIT/ML FlexTouch Pen Inject 17 Units into the skin 2 (two) times daily.   losartan (COZAAR) 100 MG tablet Take 100 mg by mouth daily.   mupirocin ointment (BACTROBAN) 2 % APPLY 1 APPLICATION TOPICALLY DAILY   pravastatin (PRAVACHOL) 20 MG tablet Take 20 mg by mouth at bedtime.   No current facility-administered medications for this visit. (Other)   REVIEW OF SYSTEMS: ROS   Positive for: Endocrine, Cardiovascular, Eyes Negative for: Constitutional, Gastrointestinal, Neurological, Skin, Genitourinary, Musculoskeletal, HENT, Respiratory, Psychiatric, Allergic/Imm, Heme/Lymph Last edited by Etheleen Mayhew, COT on 03/31/2023  9:27 AM.     ALLERGIES Allergies  Allergen Reactions   Hydrochlorothiazide Other (See Comments)    Dizziness   PAST MEDICAL HISTORY Past Medical History:  Diagnosis Date   Diabetes mellitus    Edema    Essential hypertension, benign    Hyperlipidemia    Obesity, unspecified    Past Surgical History:  Procedure Laterality Date   AMPUTATION Right 11/20/2021   Procedure: AMPUTATION GREAT TOE;  Surgeon: Edwin Cap,  DPM;  Location: [facility] OR;  Service: Podiatry;  Laterality: Right;   CATARACT EXTRACTION     CESAREAN SECTION     EYE SURGERY     FAMILY HISTORY Family History  Family history unknown: Yes   SOCIAL HISTORY Social History   Tobacco Use   Smoking status: Never   Smokeless tobacco: Never  Vaping Use   Vaping status: Never Used  Substance Use Topics   Alcohol use: Yes    Comment: occasional   Drug use: No       OPHTHALMIC EXAM:  Base Eye Exam     Visual Acuity (Snellen - Linear)       Right Left   Dist [place] 20/300 20/30   Dist ph  [place] NI NI         Tonometry (Tonopen, 9:37 AM)       Right Left   Pressure 22 21         Pupils       Pupils Dark Light Shape React APD   Right PERRL 3 2 Round Brisk None   Left PERRL 3 2 Round Brisk None         Visual Fields       Left Right    Full Full         Extraocular Movement       Right Left    Full, Ortho Full, Ortho         Neuro/Psych     Oriented x3: Yes   Mood/Affect: Normal         Dilation     Both eyes: 2.5% Phenylephrine @ 9:38 AM           Slit Lamp and Fundus Exam     Slit Lamp Exam       Right Left   Lids/Lashes Dermatochalasis - upper lid Dermatochalasis - upper lid   Conjunctiva/Sclera Melanosis Melanosis   Cornea 3+ Punctate epithelial erosions, well healed cataract wound arcus, trace PEE, mild endo pigment, well healed cataract wound   Anterior Chamber deep, clear, narrow angles deep, clear, narrow angles   Iris Round and dilated Round and dilated   Lens PC IOL in good position with open PC PC IOL in good position   Anterior Vitreous mild syneresis post vitrectomy         Fundus Exam       Right Left   Disc 2-3+Pallor, Sharp rim 2+ Pallor, Sharp rim   C/D Ratio 0.4 0.5   Macula Flat, Blunted foveal reflex, scattered IRH/DBH and exudates, scattered cystic changes -- slightly improved, prominent DBH temporal macula Flat, Blunted foveal reflex, scattered MA/DBH greatest temporally, central cystic changes / edema -- slightly increased, dense laser temporal macula   Vessels attenuated, Tortuous attenuated, Tortuous   Periphery Attached, scattered MA/DBH and exudates Attached, scattered MA / DBH, scattered patches of PRP with room for fill in           IMAGING AND PROCEDURES  Imaging and Procedures for 03/31/2023  OCT, Retina - OU - Both Eyes       Right Eye Quality was good. Scan locations included subfoveal. Central Foveal Thickness: 192. Progression has improved. Findings include normal foveal contour, no  SRF, intraretinal hyper-reflective material, intraretinal fluid, inner retinal atrophy, vitreomacular adhesion (Scattered cystic changes -- slightly improved, diffuse IRA).   Left Eye Quality was good. Scan locations included subfoveal. Central Foveal Thickness: 343. Progression has worsened. Findings include no SRF, abnormal foveal contour, subretinal  hyper-reflective material, epiretinal membrane, intraretinal fluid, subretinal fluid (Persistent cystic changes -- increased centrally).   Notes *Images captured and stored on drive  Diagnosis / Impression:  OD: CRVO -- Scattered cystic changes -- slightly improved, diffuse IRA OS: +DME -- Persistent cystic changes -- slightly increased centrally  Clinical management:  See below  Abbreviations: NFP - Normal foveal profile. CME - cystoid macular edema. PED - pigment epithelial detachment. IRF - intraretinal fluid. SRF - subretinal fluid. EZ - ellipsoid zone. ERM - epiretinal membrane. ORA - outer retinal atrophy. ORT - outer retinal tubulation. SRHM - subretinal hyper-reflective material. IRHM - intraretinal hyper-reflective material      Intravitreal Injection, Pharmacologic Agent - OD - Right Eye       Time Out 03/31/2023. 10:21 AM. Confirmed correct patient, procedure, site, and patient consented.   Anesthesia Topical anesthesia was used. Anesthetic medications included Lidocaine 2%, Proparacaine 0.5%.   Procedure Preparation included 5% betadine to ocular surface, eyelid speculum. A supplied (32g) needle was used.   Injection: 1.25 mg Bevacizumab 1.25mg /0.87ml   Route: Intravitreal, Site: Right Eye   NDC: P3213405, Lot: [id number], Expiration date: 05/06/2023   Post-op Post injection exam found visual acuity of at least counting fingers. The patient tolerated the procedure well. There were no complications. The patient received written and verbal post procedure care education. Post injection medications were not given.       Intravitreal Injection, Pharmacologic Agent - OS - Left Eye       Time Out 03/31/2023. 10:22 AM. Confirmed correct patient, procedure, site, and patient consented.   Anesthesia Topical anesthesia was used. Anesthetic medications included Lidocaine 2%, Proparacaine 0.5%.   Procedure Preparation included 5% betadine to ocular surface, eyelid speculum. A (32g) needle was used.   Injection: 1.25 mg Bevacizumab 1.25mg /0.11ml   Route: Intravitreal, Site: Left Eye   NDC: P3213405, Lot: [id number], Expiration date: 07/10/2023   Post-op Post injection exam found visual acuity of at least counting fingers. The patient tolerated the procedure well. There were no complications. The patient received written and verbal post procedure care education.            ASSESSMENT/PLAN:    ICD-10-CM   1. Central retinal vein occlusion with macular edema of right eye  H34.8110 OCT, Retina - OU - Both Eyes    2. Proliferative diabetic retinopathy of both eyes with macular edema associated with type 2 diabetes mellitus (HCC)  X91.4782 Intravitreal Injection, Pharmacologic Agent - OD - Right Eye    Intravitreal Injection, Pharmacologic Agent - OS - Left Eye    Bevacizumab (AVASTIN) SOLN 1.25 mg    Bevacizumab (AVASTIN) SOLN 1.25 mg    3. Long term (current) use of oral hypoglycemic drugs  Z79.84     4. Encounter for long-term (current) use of insulin (HCC)  Z79.4     5. History of vitrectomy  Z98.890     6. Essential hypertension  I10     7. Hypertensive retinopathy of both eyes  H35.033     8. Pseudophakia, both eyes  Z96.1     9. Ocular hypertension of right eye  H40.051      1. CRVO w/ CME, OD  - delayed f/u -- 6.9 wks instead of 4 (08.13.24 to 09.30.24 -- transportation issues)  - delayed f/u -- 3.5 mos instead of 4 wks due to insurance issues (3.19.24 to 07.08.24)  - history of multiple IVA OD w/ [facility] (last 06.06.23) - s/p IVA OD #1 (02.20.24), #2 (03.19.24) #  3 (07.08.24), #4  (08.13.24), #5 (09.30.24), #6 (10.30.24)  - BCVA OD 20/300 - OCT shows OD: scattered cystic changes -- slightly improved, diffuse IRA at 4 wks - recommend IVA OD #7 -- see below  - RBA of procedure discussed, questions answered - IVA informed consent obtained and signed, 02.20.24 (OU) - see procedure note - f/u 4 wks -- DFE/OCT, possible injection  2-5. Proliferative diabetic retinopathy, both eyes  - A1C 7.0 (10.15.24), 9.8 (02.16.24)  - history of PPV w/ membrane peel and laser OS for PDR ([facility] - 06.28.23) - s/p IVA OU #1 (02.20.24), #2 (03.19.24), #3 (07.08.24), #4 (08.13.24), #5 (09.30.24), #6 (10.30.24) - exam shows scattered MA, DBH and preretinal heme OU; OS with PRP 360 - FA (02.20.24) shows OD: fine NVD; scattered patches of vascular nonperfusion; enlarged FAZ; diffuse leaking MA; OS: Dense cluster of leaking MA's ST mac; staining of PRP laser 360; no active NV - OCT shows OD: CRVO -- Scattered cystic changes -- slightly improved, diffuse IRA; OS: +DME -- Persistent cystic changes -- increased centrally at 4 weeks **discussed decreased efficacy / resistance to Avastin and potential benefit of switching medication**   - recommend IVA OU #7 today, 12.06.24 for DME w/ f/u in 4 wks again - pt wishes to proceed with injections - RBA of procedure discussed, questions answered - Avastin informed consent obtained and signed OU 02.20.24 - see procedure note - Will check [employer] auth.for next visit - f/u in 4 wks -- DFE/OCT, possible injection  6,7. Hypertensive retinopathy OU - discussed importance of tight BP control - continue to monitor  8. Pseudophakia OU  - s/p CE/IOL OU  - IOL in good position, doing well  - continue to monitor  9. Ocular Hypertension OD  - IOP today: 22, 21  - on Cosopt BID OU and Latanoprost QHS OU per Dr. Dione Booze  - started Brimonidine bid OD only in March 2024  Ophthalmic Meds Ordered this visit:  Meds ordered this encounter  Medications    Bevacizumab (AVASTIN) SOLN 1.25 mg   Bevacizumab (AVASTIN) SOLN 1.25 mg     Return in about 4 weeks (around 04/28/2023) for f/u PDR OU , DFE, OCT, Possible, IVA vs. IVE  OU.  There are no Patient Instructions on file for this visit.   Explained the diagnoses, plan, and follow up with the patient and they expressed understanding.  Patient expressed understanding of the importance of proper follow up care.    Electronically signed by: Berlin Hun COT 12.05.24  4:38 PM.  This document serves as a record of services personally performed by Karie Chimera, MD, PhD. It was created on their behalf by Charlette Caffey, COT an ophthalmic technician. The creation of this record is the provider's dictation and/or activities during the visit.    Electronically signed by:  Charlette Caffey, COT  03/31/23 4:38 PM  Karie Chimera, M.D., Ph.D. Diseases & Surgery of the Retina and Vitreous [facility]  I have reviewed the above documentation for accuracy and completeness, and I agree with the above. Karie Chimera, M.D., Ph.D. 03/31/23 4:41 PM  Abbreviations: M myopia (nearsighted); A astigmatism; H hyperopia (farsighted); P presbyopia; Mrx spectacle prescription;  CTL contact lenses; OD right eye; OS left eye; OU both eyes  XT exotropia; ET esotropia; PEK punctate epithelial keratitis; PEE punctate epithelial erosions; DES dry eye syndrome; MGD meibomian gland dysfunction; ATs artificial tears; PFAT's preservative free artificial tears; NSC nuclear sclerotic cataract;  PSC posterior subcapsular cataract; ERM epi-retinal membrane; PVD posterior vitreous detachment; RD retinal detachment; DM diabetes mellitus; DR diabetic retinopathy; NPDR non-proliferative diabetic retinopathy; PDR proliferative diabetic retinopathy; CSME clinically significant macular edema; DME diabetic macular edema; dbh dot blot hemorrhages; CWS cotton wool spot; POAG primary open angle glaucoma; C/D  cup-to-disc ratio; HVF humphrey visual field; GVF goldmann visual field; OCT optical coherence tomography; IOP intraocular pressure; BRVO Branch retinal vein occlusion; CRVO central retinal vein occlusion; CRAO central retinal artery occlusion; BRAO branch retinal artery occlusion; RT retinal tear; SB scleral buckle; PPV pars plana vitrectomy; VH Vitreous hemorrhage; PRP panretinal laser photocoagulation; IVK intravitreal kenalog; VMT vitreomacular traction; MH Macular hole;  NVD neovascularization of the disc; NVE neovascularization elsewhere; AREDS age related eye disease study; ARMD age related macular degeneration; POAG primary open angle glaucoma; EBMD epithelial/anterior basement membrane dystrophy; ACIOL anterior chamber intraocular lens; IOL intraocular lens; PCIOL posterior chamber intraocular lens; Phaco/IOL phacoemulsification with intraocular lens placement; PRK photorefractive keratectomy; LASIK laser assisted in situ keratomileusis; HTN hypertension; DM diabetes mellitus; COPD chronic obstructive pulmonary disease

## 2023-03-31 ENCOUNTER — Encounter (INDEPENDENT_AMBULATORY_CARE_PROVIDER_SITE_OTHER): Payer: Self-pay | Admitting: Ophthalmology

## 2023-03-31 ENCOUNTER — Ambulatory Visit (INDEPENDENT_AMBULATORY_CARE_PROVIDER_SITE_OTHER): Payer: Medicare Other | Admitting: Ophthalmology

## 2023-03-31 DIAGNOSIS — Z794 Long term (current) use of insulin: Secondary | ICD-10-CM

## 2023-03-31 DIAGNOSIS — Z7984 Long term (current) use of oral hypoglycemic drugs: Secondary | ICD-10-CM | POA: Diagnosis not present

## 2023-03-31 DIAGNOSIS — E113513 Type 2 diabetes mellitus with proliferative diabetic retinopathy with macular edema, bilateral: Secondary | ICD-10-CM | POA: Diagnosis not present

## 2023-03-31 DIAGNOSIS — H34811 Central retinal vein occlusion, right eye, with macular edema: Secondary | ICD-10-CM

## 2023-03-31 DIAGNOSIS — H35033 Hypertensive retinopathy, bilateral: Secondary | ICD-10-CM

## 2023-03-31 DIAGNOSIS — Z9889 Other specified postprocedural states: Secondary | ICD-10-CM

## 2023-03-31 DIAGNOSIS — H40051 Ocular hypertension, right eye: Secondary | ICD-10-CM

## 2023-03-31 DIAGNOSIS — I10 Essential (primary) hypertension: Secondary | ICD-10-CM

## 2023-03-31 DIAGNOSIS — Z961 Presence of intraocular lens: Secondary | ICD-10-CM

## 2023-03-31 MED ORDER — BEVACIZUMAB CHEMO INJECTION 1.25MG/0.05ML SYRINGE FOR KALEIDOSCOPE
1.2500 mg | INTRAVITREAL | Status: AC | PRN
  Administered 2023-03-31: 1.25 mg via INTRAVITREAL

## 2023-04-28 ENCOUNTER — Encounter (INDEPENDENT_AMBULATORY_CARE_PROVIDER_SITE_OTHER): Payer: Medicare Other | Admitting: Ophthalmology

## 2023-05-01 ENCOUNTER — Encounter (INDEPENDENT_AMBULATORY_CARE_PROVIDER_SITE_OTHER): Payer: Self-pay

## 2023-05-01 ENCOUNTER — Encounter (INDEPENDENT_AMBULATORY_CARE_PROVIDER_SITE_OTHER): Payer: Medicare Other | Admitting: Ophthalmology

## 2023-05-01 DIAGNOSIS — H35033 Hypertensive retinopathy, bilateral: Secondary | ICD-10-CM

## 2023-05-01 DIAGNOSIS — Z794 Long term (current) use of insulin: Secondary | ICD-10-CM

## 2023-05-01 DIAGNOSIS — I10 Essential (primary) hypertension: Secondary | ICD-10-CM

## 2023-05-01 DIAGNOSIS — H40051 Ocular hypertension, right eye: Secondary | ICD-10-CM

## 2023-05-01 DIAGNOSIS — Z7984 Long term (current) use of oral hypoglycemic drugs: Secondary | ICD-10-CM

## 2023-05-01 DIAGNOSIS — E113513 Type 2 diabetes mellitus with proliferative diabetic retinopathy with macular edema, bilateral: Secondary | ICD-10-CM

## 2023-05-01 DIAGNOSIS — Z961 Presence of intraocular lens: Secondary | ICD-10-CM

## 2023-05-01 DIAGNOSIS — Z9889 Other specified postprocedural states: Secondary | ICD-10-CM

## 2023-05-01 DIAGNOSIS — H34811 Central retinal vein occlusion, right eye, with macular edema: Secondary | ICD-10-CM

## 2023-05-01 NOTE — Progress Notes (Signed)
 [facility] - Clinic Note  05/02/2023     CHIEF COMPLAINT Patient presents for Retina Follow Up   HISTORY OF PRESENT ILLNESS: Ann Gomez is a 73 y.o. female who presents to the clinic today for:   HPI     Retina Follow Up   Patient presents with  Diabetic Retinopathy.  In both eyes.  This started 4 weeks ago.  Duration of 4 weeks.  Since onset it is stable.  I, the attending physician,  performed the HPI with the patient and updated documentation appropriately.        Comments   4 week retina follow up PDR OU IVA uv I'VE OU pt is reporting no vision changes noticed she denies any flashes she has some floaters her last reading 127 Monday am she is using Cosopt BID OU Latanoprost QHS OU and Brimonidine  bid OU       Last edited by Valdemar Rogue, MD on 05/02/2023  4:46 PM.     Patient states the vision is about the same.  Referring physician: Earvin Johnston PARAS, FNP [address] [place],  [place] [address]  HISTORICAL INFORMATION:   Selected notes from the MEDICAL RECORD NUMBER Dr. Elner pt here due to insurance reasons LEE:  Ocular Hx- PMH-    CURRENT MEDICATIONS: Current Outpatient Medications (Ophthalmic Drugs)  Medication Sig   brimonidine  (ALPHAGAN ) 0.2 % ophthalmic solution Place 1 drop into the right eye 2 (two) times daily.   dorzolamide-timolol (COSOPT) 22.3-6.8 MG/ML ophthalmic solution Place 1 drop into both eyes 2 (two) times daily.   latanoprost (XALATAN) 0.005 % ophthalmic solution Place 1 drop into both eyes at bedtime.   Latanoprostene Bunod (VYZULTA) 0.024 % SOLN Place 1 drop into both eyes at bedtime. QHS OU   No current facility-administered medications for this visit. (Ophthalmic Drugs)   Current Outpatient Medications (Other)  Medication Sig   amLODipine  (NORVASC ) 5 MG tablet Take 1 tablet (5 mg total) by mouth daily.   aspirin  EC 81 MG tablet Take 1 tablet (81 mg total) by mouth daily.   chlorthalidone  (HYGROTON ) 25 MG  tablet Take 1 tablet (25 mg total) by mouth daily.   Cholecalciferol (VITAMIN D3 PO) Take 1 capsule by mouth daily.   Ginger, Zingiber officinalis, (GINGER ROOT PO) Take 550 mg by mouth daily.   glipiZIDE (GLUCOTROL) 10 MG tablet Take 10 mg by mouth in the morning and at bedtime.   hydrALAZINE  (APRESOLINE ) 25 MG tablet Take 25 mg by mouth 3 (three) times daily.   LEVEMIR FLEXTOUCH 100 UNIT/ML FlexTouch Pen Inject 17 Units into the skin 2 (two) times daily.   losartan  (COZAAR ) 100 MG tablet Take 100 mg by mouth daily.   mupirocin  ointment (BACTROBAN ) 2 % APPLY 1 APPLICATION TOPICALLY DAILY   pravastatin  (PRAVACHOL ) 20 MG tablet Take 20 mg by mouth at bedtime.   No current facility-administered medications for this visit. (Other)   REVIEW OF SYSTEMS: ROS   Positive for: Endocrine, Cardiovascular, Eyes Negative for: Constitutional, Gastrointestinal, Neurological, Skin, Genitourinary, Musculoskeletal, HENT, Respiratory, Psychiatric, Allergic/Imm, Heme/Lymph Last edited by Resa Delon ORN, COT on 05/02/2023  9:24 AM.      ALLERGIES Allergies  Allergen Reactions   Hydrochlorothiazide Other (See Comments)    Dizziness   PAST MEDICAL HISTORY Past Medical History:  Diagnosis Date   Diabetes mellitus    Edema    Essential hypertension, benign    Hyperlipidemia    Obesity, unspecified    Past Surgical  History:  Procedure Laterality Date   AMPUTATION Right 11/20/2021   Procedure: AMPUTATION GREAT TOE;  Surgeon: Silva Juliene SAUNDERS, DPM;  Location: [facility] OR;  Service: Podiatry;  Laterality: Right;   CATARACT EXTRACTION     CESAREAN SECTION     EYE SURGERY     FAMILY HISTORY Family History  Family history unknown: Yes   SOCIAL HISTORY Social History   Tobacco Use   Smoking status: Never   Smokeless tobacco: Never  Vaping Use   Vaping status: Never Used  Substance Use Topics   Alcohol use: Yes    Comment: occasional   Drug use: No       OPHTHALMIC EXAM:  Base Eye Exam      Visual Acuity (Snellen - Linear)       Right Left   Dist Hardin CF at 3' 20/40 -1   Dist ph Barnett NI 20/30 -2    Correction: Glasses         Tonometry (Tonopen, 9:32 AM)       Right Left   Pressure 24 20         Pupils       Pupils Dark Light Shape React APD   Right PERRL 3 2 Round Brisk None   Left PERRL 3 2 Round Brisk None         Visual Fields       Left Right    Full Full         Extraocular Movement       Right Left    Full, Ortho Full, Ortho         Neuro/Psych     Oriented x3: Yes   Mood/Affect: Normal           Slit Lamp and Fundus Exam     Slit Lamp Exam       Right Left   Lids/Lashes Dermatochalasis - upper lid Dermatochalasis - upper lid   Conjunctiva/Sclera Melanosis Melanosis   Cornea 3+ Punctate epithelial erosions, well healed cataract wound arcus, trace PEE, mild endo pigment, well healed cataract wound   Anterior Chamber deep, clear, narrow angles deep, clear, narrow angles   Iris Round and dilated Round and dilated   Lens PC IOL in good position with open PC PC IOL in good position   Anterior Vitreous mild syneresis post vitrectomy         Fundus Exam       Right Left   Disc 2-3+Pallor, Sharp rim 2+ Pallor, Sharp rim   C/D Ratio 0.4 0.5   Macula Flat, Blunted foveal reflex, scattered IRH/DBH and exudates, scattered cystic changes -- slightly improved, prominent DBH temporal macula, diffuse atrophy Flat, Blunted foveal reflex, scattered MA/DBH greatest temporally, central cystic changes / edema -- slightly improved, dense laser temporal macula   Vessels attenuated, Tortuous attenuated, Tortuous   Periphery Attached, scattered MA/DBH and exudates Attached, scattered MA / DBH, scattered patches of PRP with room for fill in           IMAGING AND PROCEDURES  Imaging and Procedures for 05/02/2023  OCT, Retina - OU - Both Eyes       Right Eye Quality was good. Scan locations included subfoveal. Central Foveal  Thickness: 196. Progression has improved. Findings include normal foveal contour, no SRF, intraretinal hyper-reflective material, intraretinal fluid, inner retinal atrophy, vitreomacular adhesion (Scattered cystic changes -- slightly improved, diffuse IRA, patchy ORA slightly worse).   Left Eye Quality was good. Scan locations included  subfoveal. Central Foveal Thickness: 333. Progression has improved. Findings include no SRF, abnormal foveal contour, subretinal hyper-reflective material, epiretinal membrane, intraretinal fluid, subretinal fluid (Persistent cystic changes -- slightly improved).   Notes *Images captured and stored on drive  Diagnosis / Impression:  OD: CRVO -- Scattered cystic changes -- slightly improved, diffuse IRA, patchy ORA slightly worse OS: +DME -- Persistent cystic changes -- slightly improved  Clinical management:  See below  Abbreviations: NFP - Normal foveal profile. CME - cystoid macular edema. PED - pigment epithelial detachment. IRF - intraretinal fluid. SRF - subretinal fluid. EZ - ellipsoid zone. ERM - epiretinal membrane. ORA - outer retinal atrophy. ORT - outer retinal tubulation. SRHM - subretinal hyper-reflective material. IRHM - intraretinal hyper-reflective material      Intravitreal Injection, Pharmacologic Agent - OD - Right Eye       Time Out 05/02/2023. 10:29 AM. Confirmed correct patient, procedure, site, and patient consented.   Anesthesia Topical anesthesia was used. Anesthetic medications included Lidocaine  2%, Proparacaine 0.5%.   Procedure Preparation included 5% betadine to ocular surface, eyelid speculum. A supplied (32g) needle was used.   Injection: 1.25 mg Bevacizumab  1.25mg /0.30ml   Route: Intravitreal, Site: Right Eye   NDC: H525437, Lot: [id number], Expiration date: 06/12/2023   Post-op Post injection exam found visual acuity of at least counting fingers. The patient tolerated the procedure well. There were no  complications. The patient received written and verbal post procedure care education. Post injection medications were not given.      Intravitreal Injection, Pharmacologic Agent - OS - Left Eye       Time Out 05/02/2023. 10:31 AM. Confirmed correct patient, procedure, site, and patient consented.   Anesthesia Topical anesthesia was used. Anesthetic medications included Lidocaine  2%, Proparacaine 0.5%.   Procedure Preparation included 5% betadine to ocular surface, eyelid speculum. A (32g) needle was used.   Injection: 1.25 mg Bevacizumab  1.25mg /0.87ml   Route: Intravitreal, Site: Left Eye   NDC: H525437, Lot: [id number], Expiration date: 08/16/2023   Post-op Post injection exam found visual acuity of at least counting fingers. The patient tolerated the procedure well. There were no complications. The patient received written and verbal post procedure care education.            ASSESSMENT/PLAN:    ICD-10-CM   1. Central retinal vein occlusion with macular edema of right eye  H34.8110 OCT, Retina - OU - Both Eyes    2. Proliferative diabetic retinopathy of both eyes with macular edema associated with type 2 diabetes mellitus (HCC)  Z88.6486 Intravitreal Injection, Pharmacologic Agent - OD - Right Eye    Intravitreal Injection, Pharmacologic Agent - OS - Left Eye    Bevacizumab  (AVASTIN ) SOLN 1.25 mg    Bevacizumab  (AVASTIN ) SOLN 1.25 mg    3. Long term (current) use of oral hypoglycemic drugs  Z79.84     4. Encounter for long-term (current) use of insulin  (HCC)  Z79.4     5. History of vitrectomy  Z98.890     6. Essential hypertension  I10     7. Hypertensive retinopathy of both eyes  H35.033     8. Pseudophakia, both eyes  Z96.1     9. Ocular hypertension of right eye  H40.051      1. CRVO w/ CME, OD  - delayed f/u -- 6.9 wks instead of 4 (08.13.24 to 09.30.24 -- transportation issues)  - delayed f/u -- 3.5 mos instead of 4 wks due to insurance issues (3.19.24 to  07.08.24)  - history of multiple IVA OD w/ [facility] (last 06.06.23) - s/p IVA OD #1 (02.20.24), #2 (03.19.24) #3 (07.08.24), #4 (08.13.24), #5 (09.30.24), #6 (10.30.24)  - BCVA OD CF3 - OCT shows OD: scattered cystic changes -- slightly improved, diffuse IRA at 4 wks - recommend IVA OD #8 -- see below  - RBA of procedure discussed, questions answered - IVA informed consent obtained and signed, 02.20.24 (OU) - see procedure note - f/u 4 wks -- DFE/OCT, possible injection  2-5. Proliferative diabetic retinopathy, both eyes  - A1C 7.0 (10.15.24), 9.8 (02.16.24)  - history of PPV w/ membrane peel and laser OS for PDR ([facility] - 06.28.23) - s/p IVA OU #1 (02.20.24), #2 (03.19.24), #3 (07.08.24), #4 (08.13.24), #5 (09.30.24), #6 (10.30.24), #7 (12.06.24) - exam shows scattered MA, DBH and preretinal heme OU; OS with PRP 360 - FA (02.20.24) shows OD: fine NVD; scattered patches of vascular nonperfusion; enlarged FAZ; diffuse leaking MA; OS: Dense cluster of leaking MA's ST mac; staining of PRP laser 360; no active NV - OCT shows OD: CRVO -- Scattered cystic changes -- slightly improved, diffuse IRA, patchy ORA slightly worse; OS: +DME -- Persistent cystic changes -- slightly improved at 4+ weeks **discussed decreased efficacy / resistance to Avastin  and potential benefit of switching medication**  - [facility]  approved but [employer] funding unavailable - recommend IVA OU #8 today, 1.07.25 for DME w/ f/u in 4 wks again - pt wishes to proceed with injections - RBA of procedure discussed, questions answered - Avastin  informed consent obtained and signed OU 02.20.24 - see procedure note - f/u in 4 wks -- DFE/OCT, possible injection  6,7. Hypertensive retinopathy OU - discussed importance of tight BP control - continue to monitor  8. Pseudophakia OU  - s/p CE/IOL OU  - IOL in good position, doing well  - continue to monitor  9. Ocular Hypertension OD  - IOP today: 24, 20  - on Cosopt BID OU and  Latanoprost QHS OU per Dr. Octavia  - started Brimonidine  bid OD only in March 2024  Ophthalmic Meds Ordered this visit:  Meds ordered this encounter  Medications   Bevacizumab  (AVASTIN ) SOLN 1.25 mg   Bevacizumab  (AVASTIN ) SOLN 1.25 mg     Return in about 4 weeks (around 05/30/2023) for f/u PDR OU, DFE, OCT, Possible, IVA, OU.  There are no Patient Instructions on file for this visit.   This document serves as a record of services personally performed by Redell JUDITHANN Hans, MD, PhD. It was created on their behalf by Alan PARAS. Delores, OA an ophthalmic technician. The creation of this record is the provider's dictation and/or activities during the visit.    Electronically signed by: Alan PARAS. Delores, OA 05/06/23 1:22 AM  This document serves as a record of services personally performed by Redell JUDITHANN Hans, MD, PhD. It was created on their behalf by Wanda GEANNIE Keens, COT an ophthalmic technician. The creation of this record is the provider's dictation and/or activities during the visit.    Electronically signed by:  Wanda GEANNIE Keens, COT  05/06/23 1:22 AM   Redell JUDITHANN Hans, M.D., Ph.D. Diseases & Surgery of the Retina and Vitreous [facility]  I have reviewed the above documentation for accuracy and completeness, and I agree with the above. Redell JUDITHANN Hans, M.D., Ph.D. 05/06/23 1:24 AM   Abbreviations: M myopia (nearsighted); A astigmatism; H hyperopia (farsighted); P presbyopia; Mrx spectacle prescription;  CTL contact lenses; OD  right eye; OS left eye; OU both eyes  XT exotropia; ET esotropia; PEK punctate epithelial keratitis; PEE punctate epithelial erosions; DES dry eye syndrome; MGD meibomian gland dysfunction; ATs artificial tears; PFAT's preservative free artificial tears; NSC nuclear sclerotic cataract; PSC posterior subcapsular cataract; ERM epi-retinal membrane; PVD posterior vitreous detachment; RD retinal detachment; DM diabetes mellitus; DR diabetic  retinopathy; NPDR non-proliferative diabetic retinopathy; PDR proliferative diabetic retinopathy; CSME clinically significant macular edema; DME diabetic macular edema; dbh dot blot hemorrhages; CWS cotton wool spot; POAG primary open angle glaucoma; C/D cup-to-disc ratio; HVF humphrey visual field; GVF goldmann visual field; OCT optical coherence tomography; IOP intraocular pressure; BRVO Branch retinal vein occlusion; CRVO central retinal vein occlusion; CRAO central retinal artery occlusion; BRAO branch retinal artery occlusion; RT retinal tear; SB scleral buckle; PPV pars plana vitrectomy; VH Vitreous hemorrhage; PRP panretinal laser photocoagulation; IVK intravitreal kenalog; VMT vitreomacular traction; MH Macular hole;  NVD neovascularization of the disc; NVE neovascularization elsewhere; AREDS age related eye disease study; ARMD age related macular degeneration; POAG primary open angle glaucoma; EBMD epithelial/anterior basement membrane dystrophy; ACIOL anterior chamber intraocular lens; IOL intraocular lens; PCIOL posterior chamber intraocular lens; Phaco/IOL phacoemulsification with intraocular lens placement; PRK photorefractive keratectomy; LASIK laser assisted in situ keratomileusis; HTN hypertension; DM diabetes mellitus; COPD chronic obstructive pulmonary disease

## 2023-05-02 ENCOUNTER — Encounter (INDEPENDENT_AMBULATORY_CARE_PROVIDER_SITE_OTHER): Payer: Self-pay | Admitting: Ophthalmology

## 2023-05-02 ENCOUNTER — Ambulatory Visit (INDEPENDENT_AMBULATORY_CARE_PROVIDER_SITE_OTHER): Payer: Medicare Other | Admitting: Ophthalmology

## 2023-05-02 DIAGNOSIS — Z961 Presence of intraocular lens: Secondary | ICD-10-CM

## 2023-05-02 DIAGNOSIS — Z794 Long term (current) use of insulin: Secondary | ICD-10-CM | POA: Diagnosis not present

## 2023-05-02 DIAGNOSIS — H40051 Ocular hypertension, right eye: Secondary | ICD-10-CM

## 2023-05-02 DIAGNOSIS — Z7984 Long term (current) use of oral hypoglycemic drugs: Secondary | ICD-10-CM

## 2023-05-02 DIAGNOSIS — H34811 Central retinal vein occlusion, right eye, with macular edema: Secondary | ICD-10-CM | POA: Diagnosis not present

## 2023-05-02 DIAGNOSIS — I10 Essential (primary) hypertension: Secondary | ICD-10-CM

## 2023-05-02 DIAGNOSIS — E113513 Type 2 diabetes mellitus with proliferative diabetic retinopathy with macular edema, bilateral: Secondary | ICD-10-CM | POA: Diagnosis not present

## 2023-05-02 DIAGNOSIS — Z9889 Other specified postprocedural states: Secondary | ICD-10-CM

## 2023-05-02 DIAGNOSIS — H35033 Hypertensive retinopathy, bilateral: Secondary | ICD-10-CM

## 2023-05-02 MED ORDER — BEVACIZUMAB CHEMO INJECTION 1.25MG/0.05ML SYRINGE FOR KALEIDOSCOPE
1.2500 mg | INTRAVITREAL | Status: AC | PRN
  Administered 2023-05-02: 1.25 mg via INTRAVITREAL

## 2023-05-05 ENCOUNTER — Ambulatory Visit: Payer: Medicare Other | Admitting: Podiatry

## 2023-05-17 ENCOUNTER — Ambulatory Visit: Payer: Medicare Other | Admitting: Podiatry

## 2023-05-17 NOTE — Progress Notes (Signed)
 [facility] - Clinic Note  05/30/2023     CHIEF COMPLAINT Patient presents for Retina Follow Up   HISTORY OF PRESENT ILLNESS: Ann MORRISSETTE is a 73 y.o. female who presents to the clinic today for:   HPI     Retina Follow Up   Patient presents with  Diabetic Retinopathy.  In both eyes.  This started 4 weeks ago.  I, the attending physician,  performed the HPI with the patient and updated documentation appropriately.        Comments   Patient here for 4 weeks retina follow up for PDR OU. Patient states vision doing pretty good. No eye pain.       Last edited by Valdemar Rogue, MD on 05/30/2023 10:45 AM.    Patient states the vision is about the same.  Referring physician: Earvin Johnston PARAS, FNP [address] [place],  [place] [address]  HISTORICAL INFORMATION:   Selected notes from the MEDICAL RECORD NUMBER Dr. Elner pt here due to insurance reasons LEE:  Ocular Hx- PMH-    CURRENT MEDICATIONS: Current Outpatient Medications (Ophthalmic Drugs)  Medication Sig   brimonidine  (ALPHAGAN ) 0.2 % ophthalmic solution Place 1 drop into the right eye 2 (two) times daily.   dorzolamide-timolol (COSOPT) 22.3-6.8 MG/ML ophthalmic solution Place 1 drop into both eyes 2 (two) times daily.   latanoprost (XALATAN) 0.005 % ophthalmic solution Place 1 drop into both eyes at bedtime.   Latanoprostene Bunod (VYZULTA) 0.024 % SOLN Place 1 drop into both eyes at bedtime. QHS OU   No current facility-administered medications for this visit. (Ophthalmic Drugs)   Current Outpatient Medications (Other)  Medication Sig   amLODipine  (NORVASC ) 5 MG tablet Take 1 tablet (5 mg total) by mouth daily.   aspirin  EC 81 MG tablet Take 1 tablet (81 mg total) by mouth daily.   Cholecalciferol (VITAMIN D3 PO) Take 1 capsule by mouth daily.   Ginger, Zingiber officinalis, (GINGER ROOT PO) Take 550 mg by mouth daily.   glipiZIDE (GLUCOTROL) 10 MG tablet Take 10 mg by mouth in the  morning and at bedtime.   hydrALAZINE  (APRESOLINE ) 25 MG tablet Take 25 mg by mouth 3 (three) times daily.   LEVEMIR FLEXTOUCH 100 UNIT/ML FlexTouch Pen Inject 17 Units into the skin 2 (two) times daily.   losartan  (COZAAR ) 100 MG tablet Take 100 mg by mouth daily.   mupirocin  ointment (BACTROBAN ) 2 % APPLY 1 APPLICATION TOPICALLY DAILY   pravastatin  (PRAVACHOL ) 20 MG tablet Take 20 mg by mouth at bedtime.   chlorthalidone  (HYGROTON ) 25 MG tablet Take 1 tablet (25 mg total) by mouth daily.   No current facility-administered medications for this visit. (Other)   REVIEW OF SYSTEMS: ROS   Positive for: Endocrine, Cardiovascular, Eyes Negative for: Constitutional, Gastrointestinal, Neurological, Skin, Genitourinary, Musculoskeletal, HENT, Respiratory, Psychiatric, Allergic/Imm, Heme/Lymph Last edited by Orval Asberry GORMAN, COA on 05/30/2023  9:42 AM.       ALLERGIES Allergies  Allergen Reactions   Hydrochlorothiazide Other (See Comments)    Dizziness   PAST MEDICAL HISTORY Past Medical History:  Diagnosis Date   Diabetes mellitus    Edema    Essential hypertension, benign    Hyperlipidemia    Obesity, unspecified    Past Surgical History:  Procedure Laterality Date   AMPUTATION Right 11/20/2021   Procedure: AMPUTATION GREAT TOE;  Surgeon: Silva Juliene SAUNDERS, DPM;  Location: [facility] OR;  Service: Podiatry;  Laterality: Right;   CATARACT EXTRACTION  CESAREAN SECTION     EYE SURGERY     FAMILY HISTORY Family History  Family history unknown: Yes   SOCIAL HISTORY Social History   Tobacco Use   Smoking status: Never   Smokeless tobacco: Never  Vaping Use   Vaping status: Never Used  Substance Use Topics   Alcohol use: Yes    Comment: occasional   Drug use: No       OPHTHALMIC EXAM:  Base Eye Exam     Visual Acuity (Snellen - Linear)       Right Left   Dist [place] 20/350 -1 20/30 -2   Dist ph Export 20/300 -1 20/30 +2         Tonometry (Tonopen, 9:40 AM)       Right  Left   Pressure 22 19         Pupils       Dark Light Shape React APD   Right 3 2 Round Brisk None   Left 3 2 Round Brisk None         Visual Fields (Counting fingers)       Left Right    Full Full         Extraocular Movement       Right Left    Full, Ortho Full, Ortho         Neuro/Psych     Oriented x3: Yes   Mood/Affect: Normal         Dilation     Both eyes: 1.0% Mydriacyl, 2.5% Phenylephrine @ 9:39 AM           Slit Lamp and Fundus Exam     Slit Lamp Exam       Right Left   Lids/Lashes Dermatochalasis - upper lid Dermatochalasis - upper lid   Conjunctiva/Sclera Melanosis Melanosis   Cornea 3+ Punctate epithelial erosions, well healed cataract wound arcus, trace PEE, mild endo pigment, well healed cataract wound   Anterior Chamber deep, clear, narrow angles deep, clear, narrow angles   Iris Round and dilated Round and dilated   Lens PC IOL in good position with open PC PC IOL in good position   Anterior Vitreous mild syneresis post vitrectomy         Fundus Exam       Right Left   Disc 2-3+Pallor, Sharp rim 2+ Pallor, Sharp rim   C/D Ratio 0.4 0.5   Macula Flat, Blunted foveal reflex, scattered IRH/DBH and exudates, scattered cystic changes -- slightly improved, prominent DBH temporal macula, diffuse atrophy Flat, Blunted foveal reflex, scattered MA/DBH greatest temporally, central cystic changes / edema, dense laser temporal macula   Vessels attenuated, Tortuous attenuated, Tortuous   Periphery Attached, scattered MA/DBH and exudates Attached, scattered MA / DBH, scattered patches of PRP with room for fill in           IMAGING AND PROCEDURES  Imaging and Procedures for 05/30/2023  OCT, Retina - OU - Both Eyes       Right Eye Quality was good. Scan locations included subfoveal. Central Foveal Thickness: 199. Progression has improved. Findings include normal foveal contour, no SRF, intraretinal hyper-reflective material,  intraretinal fluid, inner retinal atrophy, vitreomacular adhesion (Scattered cystic changes nasal mac -- slightly improved, diffuse IRA, patchy ORA ).   Left Eye Quality was good. Scan locations included subfoveal. Central Foveal Thickness: 345. Progression has been stable. Findings include no SRF, abnormal foveal contour, subretinal hyper-reflective material, epiretinal membrane, intraretinal fluid, subretinal fluid (Persistent  central cystic changes ).   Notes *Images captured and stored on drive  Diagnosis / Impression:  OD: CRVO -- Scattered cystic changes nasal mac -- slightly improved, diffuse IRA, patchy ORA  OS: +DME -- Persistent central cystic changes  Clinical management:  See below  Abbreviations: NFP - Normal foveal profile. CME - cystoid macular edema. PED - pigment epithelial detachment. IRF - intraretinal fluid. SRF - subretinal fluid. EZ - ellipsoid zone. ERM - epiretinal membrane. ORA - outer retinal atrophy. ORT - outer retinal tubulation. SRHM - subretinal hyper-reflective material. IRHM - intraretinal hyper-reflective material      Intravitreal Injection, Pharmacologic Agent - OD - Right Eye       Time Out 05/30/2023. 10:10 AM. Confirmed correct patient, procedure, site, and patient consented.   Anesthesia Topical anesthesia was used. Anesthetic medications included Lidocaine  2%, Proparacaine 0.5%.   Procedure Preparation included 5% betadine to ocular surface, eyelid speculum. A (32g) needle was used.   Injection: 2 mg aflibercept  2 MG/0.05ML   Route: Intravitreal, Site: Right Eye   NDC: D2246706, Lot: [id number], Expiration date: 02/23/2024, Waste: 0 mL   Post-op Post injection exam found visual acuity of at least counting fingers. The patient tolerated the procedure well. There were no complications. The patient received written and verbal post procedure care education. Post injection medications were not given.      Intravitreal Injection,  Pharmacologic Agent - OS - Left Eye       Time Out 05/30/2023. 10:10 AM. Confirmed correct patient, procedure, site, and patient consented.   Anesthesia Topical anesthesia was used. Anesthetic medications included Lidocaine  2%, Proparacaine 0.5%.   Procedure Preparation included 5% betadine to ocular surface, eyelid speculum. A (32g) needle was used.   Injection: 2 mg aflibercept  2 MG/0.05ML   Route: Intravitreal, Site: Left Eye   NDC: D2246706, Lot: [id number], Expiration date: 08/22/2024, Waste: 0 mL   Post-op Post injection exam found visual acuity of at least counting fingers. The patient tolerated the procedure well. There were no complications. The patient received written and verbal post procedure care education.            ASSESSMENT/PLAN:    ICD-10-CM   1. Central retinal vein occlusion with macular edema of right eye  H34.8110 OCT, Retina - OU - Both Eyes    Intravitreal Injection, Pharmacologic Agent - OD - Right Eye    aflibercept  (EYLEA ) SOLN 2 mg    2. Proliferative diabetic retinopathy of both eyes with macular edema associated with type 2 diabetes mellitus (HCC)  Z88.6486 Intravitreal Injection, Pharmacologic Agent - OD - Right Eye    Intravitreal Injection, Pharmacologic Agent - OS - Left Eye    aflibercept  (EYLEA ) SOLN 2 mg    aflibercept  (EYLEA ) SOLN 2 mg    3. Long term (current) use of oral hypoglycemic drugs  Z79.84     4. Encounter for long-term (current) use of insulin  (HCC)  Z79.4     5. History of vitrectomy  Z98.890     6. Essential hypertension  I10     7. Hypertensive retinopathy of both eyes  H35.033     8. Pseudophakia, both eyes  Z96.1     9. Ocular hypertension of right eye  H40.051       1. CRVO w/ CME, OD  - delayed f/u -- 6.9 wks instead of 4 (08.13.24 to 09.30.24 -- transportation issues)  - delayed f/u -- 3.5 mos instead of 4 wks due to insurance issues (3.19.24 to  07.08.24)  - history of multiple IVA OD w/ [facility] (last  06.06.23) - s/p IVA OD #1 (02.20.24), #2 (03.19.24) #3 (07.08.24), #4 (08.13.24), #5 (09.30.24), #6 (10.30.24) -- IVA resistance  - BCVA OD improved to 20/300 from CF3 - OCT shows OD: scattered cystic changes nasal macula -- slightly improved, diffuse IRA at 4 wks **discussed decreased efficacy / resistance to Avastin  and potential benefit of switching medication**  - recommend IVE OD #1 -- see below  - RBA of procedure discussed, questions answered  - IVE informed consent obtained and signed, 02.04.25 (OU) - IVA informed consent obtained and signed, 02.20.24 (OU) - see procedure note - f/u 4 wks -- DFE/OCT, possible injection  2-5. Proliferative diabetic retinopathy, both eyes  - A1C 7.0 (10.15.24), 9.8 (02.16.24)  - history of PPV w/ membrane peel and laser OS for PDR ([facility] - 06.28.23) - s/p IVA OU #1 (02.20.24), #2 (03.19.24), #3 (07.08.24), #4 (08.13.24), #5 (09.30.24), #6 (10.30.24), #7 (12.06.24), #8 (01.07.25) -- IVA resistance - exam shows scattered MA, DBH and preretinal heme OU; OS with PRP 360 - FA (02.20.24) shows OD: fine NVD; scattered patches of vascular nonperfusion; enlarged FAZ; diffuse leaking MA; OS: Dense cluster of leaking MA's ST mac; staining of PRP laser 360; no active NV - OCT shows OD: CRVO -- Scattered cystic changes -- slightly improved, diffuse IRA, patchy ORA; OS: +DME -- Persistent central cystic changes at 4 weeks **discussed decreased efficacy / resistance to Avastin  and potential benefit of switching medication**  - recommend switching to IVE OU #1 today, 02.04.25 for DME w/ f/u extended to 5 wks - pt wishes to proceed with injections - RBA of procedure discussed, questions answered - Eylea  informed consent obtained and signed OU 02.04.25 - see procedure note - f/u in 5 wks -- DFE/OCT, possible injection  6,7. Hypertensive retinopathy OU - discussed importance of tight BP control - continue to monitor  8. Pseudophakia OU  - s/p CE/IOL OU  - IOL in  good position, doing well  - continue to monitor  9. Ocular Hypertension OD  - IOP today: 22,19  - on Cosopt BID OU and Latanoprost QHS OU per Dr. Octavia  - started Brimonidine  bid OD only in March 2024  Ophthalmic Meds Ordered this visit:  Meds ordered this encounter  Medications   aflibercept  (EYLEA ) SOLN 2 mg   aflibercept  (EYLEA ) SOLN 2 mg     Return in about 5 weeks (around 07/04/2023) for f/u NPDR OU, DFE, OCT, Possible Injxn.  There are no Patient Instructions on file for this visit.   This document serves as a record of services personally performed by Redell JUDITHANN Hans, MD, PhD. It was created on their behalf by Wanda GEANNIE Keens, COT an ophthalmic technician. The creation of this record is the provider's dictation and/or activities during the visit.    Electronically signed by:  Wanda GEANNIE Keens, COT  05/30/23 11:00 AM  This document serves as a record of services personally performed by Redell JUDITHANN Hans, MD, PhD. It was created on their behalf by Alan PARAS. Delores, OA an ophthalmic technician. The creation of this record is the provider's dictation and/or activities during the visit.    Electronically signed by: Alan PARAS. Delores, OA 05/30/23 11:00 AM  Redell JUDITHANN Hans, M.D., Ph.D. Diseases & Surgery of the Retina and Vitreous [facility]  I have reviewed the above documentation for accuracy and completeness, and I agree with the above. Ragen Laver G. Reeves Musick,  M.D., Ph.D. 05/30/23 11:02 AM   Abbreviations: M myopia (nearsighted); A astigmatism; H hyperopia (farsighted); P presbyopia; Mrx spectacle prescription;  CTL contact lenses; OD right eye; OS left eye; OU both eyes  XT exotropia; ET esotropia; PEK punctate epithelial keratitis; PEE punctate epithelial erosions; DES dry eye syndrome; MGD meibomian gland dysfunction; ATs artificial tears; PFAT's preservative free artificial tears; NSC nuclear sclerotic cataract; PSC posterior subcapsular cataract; ERM  epi-retinal membrane; PVD posterior vitreous detachment; RD retinal detachment; DM diabetes mellitus; DR diabetic retinopathy; NPDR non-proliferative diabetic retinopathy; PDR proliferative diabetic retinopathy; CSME clinically significant macular edema; DME diabetic macular edema; dbh dot blot hemorrhages; CWS cotton wool spot; POAG primary open angle glaucoma; C/D cup-to-disc ratio; HVF humphrey visual field; GVF goldmann visual field; OCT optical coherence tomography; IOP intraocular pressure; BRVO Branch retinal vein occlusion; CRVO central retinal vein occlusion; CRAO central retinal artery occlusion; BRAO branch retinal artery occlusion; RT retinal tear; SB scleral buckle; PPV pars plana vitrectomy; VH Vitreous hemorrhage; PRP panretinal laser photocoagulation; IVK intravitreal kenalog; VMT vitreomacular traction; MH Macular hole;  NVD neovascularization of the disc; NVE neovascularization elsewhere; AREDS age related eye disease study; ARMD age related macular degeneration; POAG primary open angle glaucoma; EBMD epithelial/anterior basement membrane dystrophy; ACIOL anterior chamber intraocular lens; IOL intraocular lens; PCIOL posterior chamber intraocular lens; Phaco/IOL phacoemulsification with intraocular lens placement; PRK photorefractive keratectomy; LASIK laser assisted in situ keratomileusis; HTN hypertension; DM diabetes mellitus; COPD chronic obstructive pulmonary disease

## 2023-05-30 ENCOUNTER — Encounter (INDEPENDENT_AMBULATORY_CARE_PROVIDER_SITE_OTHER): Payer: Self-pay | Admitting: Ophthalmology

## 2023-05-30 ENCOUNTER — Ambulatory Visit (INDEPENDENT_AMBULATORY_CARE_PROVIDER_SITE_OTHER): Payer: 59 | Admitting: Ophthalmology

## 2023-05-30 DIAGNOSIS — H34811 Central retinal vein occlusion, right eye, with macular edema: Secondary | ICD-10-CM | POA: Diagnosis not present

## 2023-05-30 DIAGNOSIS — Z9889 Other specified postprocedural states: Secondary | ICD-10-CM

## 2023-05-30 DIAGNOSIS — Z7984 Long term (current) use of oral hypoglycemic drugs: Secondary | ICD-10-CM

## 2023-05-30 DIAGNOSIS — Z794 Long term (current) use of insulin: Secondary | ICD-10-CM | POA: Diagnosis not present

## 2023-05-30 DIAGNOSIS — E113513 Type 2 diabetes mellitus with proliferative diabetic retinopathy with macular edema, bilateral: Secondary | ICD-10-CM | POA: Diagnosis not present

## 2023-05-30 DIAGNOSIS — I10 Essential (primary) hypertension: Secondary | ICD-10-CM

## 2023-05-30 DIAGNOSIS — H40051 Ocular hypertension, right eye: Secondary | ICD-10-CM

## 2023-05-30 DIAGNOSIS — H35033 Hypertensive retinopathy, bilateral: Secondary | ICD-10-CM

## 2023-05-30 DIAGNOSIS — Z961 Presence of intraocular lens: Secondary | ICD-10-CM

## 2023-05-30 MED ORDER — AFLIBERCEPT 2MG/0.05ML IZ SOLN FOR KALEIDOSCOPE
2.0000 mg | INTRAVITREAL | Status: AC | PRN
  Administered 2023-05-30: 2 mg via INTRAVITREAL

## 2023-06-02 ENCOUNTER — Ambulatory Visit (INDEPENDENT_AMBULATORY_CARE_PROVIDER_SITE_OTHER): Payer: 59 | Admitting: Podiatry

## 2023-06-02 ENCOUNTER — Encounter: Payer: Self-pay | Admitting: Podiatry

## 2023-06-02 DIAGNOSIS — B351 Tinea unguium: Secondary | ICD-10-CM | POA: Diagnosis not present

## 2023-06-02 DIAGNOSIS — E1142 Type 2 diabetes mellitus with diabetic polyneuropathy: Secondary | ICD-10-CM

## 2023-06-02 DIAGNOSIS — M79675 Pain in left toe(s): Secondary | ICD-10-CM | POA: Diagnosis not present

## 2023-06-02 DIAGNOSIS — L84 Corns and callosities: Secondary | ICD-10-CM

## 2023-06-02 DIAGNOSIS — M79674 Pain in right toe(s): Secondary | ICD-10-CM | POA: Diagnosis not present

## 2023-06-02 NOTE — Progress Notes (Signed)
 This patient returns to my office for at risk foot care.  This patient requires this care by a professional since this patient will be at risk due to having diabetes.  This patient is unable to cut nails himself since the patient cannot reach his nails.These nails are painful walking and wearing shoes.  She has painful corns on both fifth toes.  Amputation right big toe. This patient presents for at risk foot care today.  General Appearance  Alert, conversant and in no acute stress.  Vascular  Dorsalis pedis and posterior tibial  pulses are  weakly palpable  bilaterally.  Capillary return is within normal limits  bilaterally. Temperature is within normal limits  bilaterally.  Neurologic  Senn-Weinstein monofilament wire test diminished   bilaterally. Muscle power within normal limits bilaterally.  Nails Thick disfigured discolored nails with subungual debris  from hallux to fifth toes bilaterally. No evidence of bacterial infection or drainage bilaterally.  Orthopedic  No limitations of motion  feet .  No crepitus or effusions noted.  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  Skin  normotropic skin with no porokeratosis noted bilaterally.  No signs of infections or ulcers noted.   [place] durum fifth toes  B/L asymptomatic.  Onychomycosis  Pain in right toes  Pain in left toes   HD fifth toes  B/L.  Consent was obtained for treatment procedures.   Mechanical debridement of nails 1-5  bilaterally performed with a nail nipper.  Filed with dremel without incident.      Return office visit     3 months                 Told patient to return for periodic foot care and evaluation due to potential at risk complications.   Cordella Bold DPM

## 2023-06-19 ENCOUNTER — Ambulatory Visit: Payer: 59 | Admitting: Cardiovascular Disease

## 2023-06-20 NOTE — Progress Notes (Signed)
 [facility] - Clinic Note  07/04/2023     CHIEF COMPLAINT Patient presents for Retina Follow Up   HISTORY OF PRESENT ILLNESS: Ann Gomez is a 73 y.o. female who presents to the clinic today for:   HPI     Retina Follow Up   Patient presents with  CRVO/BRVO.  In right eye.  This started 5 weeks ago.  Duration of 5 weeks.  Since onset it is stable.  I, the attending physician,  performed the HPI with the patient and updated documentation appropriately.        Comments   5 week retina follow up CRVO OD PDR OU and IVE OU pt is reporting no vision changes noticed she denies any flashes or floaters her last reading 110 Monday pt is using Cosopt BID OU and Latanoprost QHS OU and Brimonidine bid OU       Last edited by [name], MD on 07/04/2023 12:27 PM.    Patient states the vision is about the same.  Referring physician: Dot Been, FNP [address] [place],  [place] [address]  HISTORICAL INFORMATION:   Selected notes from the MEDICAL RECORD NUMBER Dr. [name] pt here due to insurance reasons LEE:  Ocular Hx- PMH-    CURRENT MEDICATIONS: Current Outpatient Medications (Ophthalmic Drugs)  Medication Sig   brimonidine (ALPHAGAN) 0.2 % ophthalmic solution Place 1 drop into the right eye 2 (two) times daily.   dorzolamide-timolol (COSOPT) 22.3-6.8 MG/ML ophthalmic solution Place 1 drop into both eyes 2 (two) times daily.   latanoprost (XALATAN) 0.005 % ophthalmic solution Place 1 drop into both eyes at bedtime.   Latanoprostene Bunod (VYZULTA) 0.024 % SOLN Place 1 drop into both eyes at bedtime. QHS OU   No current facility-administered medications for this visit. (Ophthalmic Drugs)   Current Outpatient Medications (Other)  Medication Sig   amLODipine (NORVASC) 5 MG tablet Take 1 tablet (5 mg total) by mouth daily.   aspirin EC 81 MG tablet Take 1 tablet (81 mg total) by mouth daily.   chlorthalidone (HYGROTON) 25 MG tablet Take 1  tablet (25 mg total) by mouth daily.   Cholecalciferol (VITAMIN D3 PO) Take 1 capsule by mouth daily.   Ginger, Zingiber officinalis, (GINGER ROOT PO) Take 550 mg by mouth daily.   glipiZIDE (GLUCOTROL) 10 MG tablet Take 10 mg by mouth in the morning and at bedtime.   hydrALAZINE (APRESOLINE) 25 MG tablet Take 25 mg by mouth 3 (three) times daily.   LEVEMIR FLEXTOUCH 100 UNIT/ML FlexTouch Pen Inject 17 Units into the skin 2 (two) times daily.   losartan (COZAAR) 100 MG tablet Take 100 mg by mouth daily.   mupirocin ointment (BACTROBAN) 2 % APPLY 1 APPLICATION TOPICALLY DAILY   pravastatin (PRAVACHOL) 20 MG tablet Take 20 mg by mouth at bedtime.   No current facility-administered medications for this visit. (Other)   REVIEW OF SYSTEMS: ROS   Positive for: Endocrine, Cardiovascular, Eyes Negative for: Constitutional, Gastrointestinal, Neurological, Skin, Genitourinary, Musculoskeletal, HENT, Respiratory, Psychiatric, Allergic/Imm, Heme/Lymph Last edited by Etheleen Mayhew, COT on 07/04/2023  9:34 AM.     ALLERGIES Allergies  Allergen Reactions   Hydrochlorothiazide Other (See Comments)    Dizziness   PAST MEDICAL HISTORY Past Medical History:  Diagnosis Date   Diabetes mellitus    Edema    Essential hypertension, benign    Hyperlipidemia    Obesity, unspecified    Past Surgical History:  Procedure Laterality Date  AMPUTATION Right 11/20/2021   Procedure: AMPUTATION GREAT TOE;  Surgeon: Edwin Cap, DPM;  Location: [facility] OR;  Service: Podiatry;  Laterality: Right;   CATARACT EXTRACTION     CESAREAN SECTION     EYE SURGERY     FAMILY HISTORY Family History  Family history unknown: Yes   SOCIAL HISTORY Social History   Tobacco Use   Smoking status: Never   Smokeless tobacco: Never  Vaping Use   Vaping status: Never Used  Substance Use Topics   Alcohol use: Yes    Comment: occasional   Drug use: No       OPHTHALMIC EXAM:  Base Eye Exam     Visual  Acuity (Snellen - Linear)       Right Left   Dist [place] 20/300 20/30 -2   Dist ph [place] NI NI         Tonometry (Tonopen, 9:47 AM)       Right Left   Pressure 20 14         Pupils       Pupils Dark Light Shape React APD   Right PERRL 3 2 Round Brisk None   Left PERRL 3 2 Round Brisk None         Visual Fields       Left Right    Full Full         Extraocular Movement       Right Left    Full, Ortho Full, Ortho         Neuro/Psych     Oriented x3: Yes   Mood/Affect: Normal         Dilation     Both eyes: 2.5% Phenylephrine @ 9:47 AM           Slit Lamp and Fundus Exam     Slit Lamp Exam       Right Left   Lids/Lashes Dermatochalasis - upper lid Dermatochalasis - upper lid   Conjunctiva/Sclera Melanosis Melanosis   Cornea 3+ Punctate epithelial erosions, well healed cataract wound arcus, trace PEE, mild endo pigment, well healed cataract wound   Anterior Chamber deep, clear, narrow angles deep, clear, narrow angles   Iris Round and dilated Round and dilated   Lens PC IOL in good position with open PC PC IOL in good position   Anterior Vitreous mild syneresis post vitrectomy         Fundus Exam       Right Left   Disc 2-3+Pallor, Sharp rim 2+ Pallor, Sharp rim   C/D Ratio 0.4 0.5   Macula Flat, Blunted foveal reflex, scattered IRH/DBH and exudates, focal cystic changes temporal macula-- slightly improved, prominent DBH IN macula, diffuse atrophy Flat, Blunted foveal reflex, scattered MA/DBH greatest temporally, central cystic changes / edema -- slightly improved, dense laser temporal macula   Vessels attenuated, Tortuous attenuated, Tortuous   Periphery Attached, scattered MA/DBH and exudates Attached, scattered MA / DBH, scattered patches of PRP with room for fill in           IMAGING AND PROCEDURES  Imaging and Procedures for 07/04/2023  OCT, Retina - OU - Both Eyes       Right Eye Quality was good. Scan locations included  subfoveal. Central Foveal Thickness: 202. Progression has improved. Findings include normal foveal contour, no SRF, intraretinal hyper-reflective material, intraretinal fluid, inner retinal atrophy, vitreomacular adhesion (Mild cystic changes nasal mac -- improved, diffuse IRA, patchy ORA ).   Left Eye  Quality was good. Scan locations included subfoveal. Central Foveal Thickness: 325. Progression has improved. Findings include no SRF, abnormal foveal contour, subretinal hyper-reflective material, epiretinal membrane, intraretinal fluid, subretinal fluid (Persistent central cystic changes -- slightly improved).   Notes *Images captured and stored on drive  Diagnosis / Impression:  OD: CRVO -- Mild cystic changes nasal mac -- improved, diffuse IRA, patchy ORA   OS: +DME -- Persistent central cystic changes -- slightly improved  Clinical management:  See below  Abbreviations: NFP - Normal foveal profile. CME - cystoid macular edema. PED - pigment epithelial detachment. IRF - intraretinal fluid. SRF - subretinal fluid. EZ - ellipsoid zone. ERM - epiretinal membrane. ORA - outer retinal atrophy. ORT - outer retinal tubulation. SRHM - subretinal hyper-reflective material. IRHM - intraretinal hyper-reflective material      Intravitreal Injection, Pharmacologic Agent - OD - Right Eye       Time Out 07/04/2023. 10:34 AM. Confirmed correct patient, procedure, site, and patient consented.   Anesthesia Topical anesthesia was used. Anesthetic medications included Lidocaine 2%, Proparacaine 0.5%.   Procedure Preparation included 5% betadine to ocular surface, eyelid speculum. A (32g) needle was used.   Injection: 2 mg aflibercept 2 MG/0.05ML   Route: Intravitreal, Site: Right Eye   NDC: L6038910, Lot: [id number], Expiration date: 05/25/2024, Waste: 0 mL   Post-op Post injection exam found visual acuity of at least counting fingers. The patient tolerated the procedure well. There were no  complications. The patient received written and verbal post procedure care education. Post injection medications were not given.      Intravitreal Injection, Pharmacologic Agent - OS - Left Eye       Time Out 07/04/2023. 10:34 AM. Confirmed correct patient, procedure, site, and patient consented.   Anesthesia Topical anesthesia was used. Anesthetic medications included Lidocaine 2%, Proparacaine 0.5%.   Procedure Preparation included 5% betadine to ocular surface, eyelid speculum. A (32g) needle was used.   Injection: 2 mg aflibercept 2 MG/0.05ML   Route: Intravitreal, Site: Left Eye   NDC: L6038910, Lot: [id number], Expiration date: 09/21/2024, Waste: 0 mL   Post-op Post injection exam found visual acuity of at least counting fingers. The patient tolerated the procedure well. There were no complications. The patient received written and verbal post procedure care education.            ASSESSMENT/PLAN:    ICD-10-CM   1. Central retinal vein occlusion with macular edema of right eye  H34.8110 OCT, Retina - OU - Both Eyes    Intravitreal Injection, Pharmacologic Agent - OD - Right Eye    aflibercept (EYLEA) SOLN 2 mg    2. Proliferative diabetic retinopathy of both eyes with macular edema associated with type 2 diabetes mellitus (HCC)  Q65.7846 Intravitreal Injection, Pharmacologic Agent - OS - Left Eye    aflibercept (EYLEA) SOLN 2 mg    3. Long term (current) use of oral hypoglycemic drugs  Z79.84     4. Encounter for long-term (current) use of insulin (HCC)  Z79.4     5. History of vitrectomy  Z98.890     6. Essential hypertension  I10     7. Hypertensive retinopathy of both eyes  H35.033     8. Pseudophakia, both eyes  Z96.1     9. Ocular hypertension of right eye  H40.051      1. CRVO w/ CME, OD  - delayed f/u -- 6.9 wks instead of 4 (08.13.24 to 09.30.24 -- transportation issues)  -  delayed f/u -- 3.5 mos instead of 4 wks due to insurance issues (3.19.24  to 07.08.24)  - history of multiple IVA OD w/ [facility] (last 06.06.23) - s/p IVA OD #1 (02.20.24), #2 (03.19.24) #3 (07.08.24), #4 (08.13.24), #5 (09.30.24), #6 (10.30.24) -- IVA resistance  - BCVA OD stable at 20/300 - OCT shows OD: Mild cystic changes nasal mac -- improved, diffuse IRA (CRAO component), patchy ORA at 5 weeks - recommend IVE OD #2 -- see below  - RBA of procedure discussed, questions answered  - IVE informed consent obtained and signed, 02.04.25 (OU) - see procedure note - f/u 5 wks -- DFE/OCT, possible injection  2-5. Proliferative diabetic retinopathy, both eyes  - A1C 7.0 (10.15.24), 9.8 (02.16.24)  - history of PPV w/ membrane peel and laser OS for PDR ([facility] - 06.28.23) - s/p IVA OU #1 (02.20.24), #2 (03.19.24), #3 (07.08.24), #4 (08.13.24), #5 (09.30.24), #6 (10.30.24), #7 (12.06.24), #8 (01.07.25) -- IVA resistance - s/p IVE OU #1 (02.04.25) - exam shows scattered MA, DBH and preretinal heme OU; OS with PRP 360 - FA (02.20.24) shows OD: fine NVD; scattered patches of vascular nonperfusion; enlarged FAZ; diffuse leaking MA; OS: Dense cluster of leaking MA's ST mac; staining of PRP laser 360; no active NV - OCT shows OD: Mild cystic changes nasal mac -- improved, diffuse IRA (CRAO component), patchy ORA; OS: Persistent central cystic changes -- slightly improved at 5 weeks - recommend IVE OU #2 today, 3.11.25 for DME w/ f/u at 5 wks again - pt wishes to proceed with injections - RBA of procedure discussed, questions answered - Eylea informed consent obtained and signed OU 02.04.25 - see procedure note - f/u in 5 wks -- DFE/OCT, possible injection  6,7. Hypertensive retinopathy OU - discussed importance of tight BP control - continue to monitor  8. Pseudophakia OU  - s/p CE/IOL OU  - IOL in good position, doing well  - continue to monitor  9. Ocular Hypertension OD  - IOP today: 20, 14  - on Cosopt BID OU and Latanoprost QHS OU per Dr. Dione Booze  - started  Brimonidine bid OD only in March 2024  Ophthalmic Meds Ordered this visit:  Meds ordered this encounter  Medications   aflibercept (EYLEA) SOLN 2 mg   aflibercept (EYLEA) SOLN 2 mg     Return in about 5 weeks (around 08/08/2023) for f/u PDR OU, DFE, OCT, Possible Injxn.  There are no Patient Instructions on file for this visit.   This document serves as a record of services personally performed by Karie Chimera, MD, PhD. It was created on their behalf by Charlette Caffey, COT an ophthalmic technician. The creation of this record is the provider's dictation and/or activities during the visit.    Electronically signed by:  Charlette Caffey, COT  07/04/23 10:19 PM  Karie Chimera, M.D., Ph.D. Diseases & Surgery of the Retina and Vitreous [facility]  I have reviewed the above documentation for accuracy and completeness, and I agree with the above. Karie Chimera, M.D., Ph.D. 07/04/23 10:21 PM    Abbreviations: M myopia (nearsighted); A astigmatism; H hyperopia (farsighted); P presbyopia; Mrx spectacle prescription;  CTL contact lenses; OD right eye; OS left eye; OU both eyes  XT exotropia; ET esotropia; PEK punctate epithelial keratitis; PEE punctate epithelial erosions; DES dry eye syndrome; MGD meibomian gland dysfunction; ATs artificial tears; PFAT's preservative free artificial tears; NSC nuclear sclerotic cataract; PSC posterior subcapsular cataract;  ERM epi-retinal membrane; PVD posterior vitreous detachment; RD retinal detachment; DM diabetes mellitus; DR diabetic retinopathy; NPDR non-proliferative diabetic retinopathy; PDR proliferative diabetic retinopathy; CSME clinically significant macular edema; DME diabetic macular edema; dbh dot blot hemorrhages; CWS cotton wool spot; POAG primary open angle glaucoma; C/D cup-to-disc ratio; HVF humphrey visual field; GVF goldmann visual field; OCT optical coherence tomography; IOP intraocular pressure; BRVO  Branch retinal vein occlusion; CRVO central retinal vein occlusion; CRAO central retinal artery occlusion; BRAO branch retinal artery occlusion; RT retinal tear; SB scleral buckle; PPV pars plana vitrectomy; VH Vitreous hemorrhage; PRP panretinal laser photocoagulation; IVK intravitreal kenalog; VMT vitreomacular traction; MH Macular hole;  NVD neovascularization of the disc; NVE neovascularization elsewhere; AREDS age related eye disease study; ARMD age related macular degeneration; POAG primary open angle glaucoma; EBMD epithelial/anterior basement membrane dystrophy; ACIOL anterior chamber intraocular lens; IOL intraocular lens; PCIOL posterior chamber intraocular lens; Phaco/IOL phacoemulsification with intraocular lens placement; PRK photorefractive keratectomy; LASIK laser assisted in situ keratomileusis; HTN hypertension; DM diabetes mellitus; COPD chronic obstructive pulmonary disease

## 2023-07-04 ENCOUNTER — Encounter (INDEPENDENT_AMBULATORY_CARE_PROVIDER_SITE_OTHER): Payer: Self-pay | Admitting: Ophthalmology

## 2023-07-04 ENCOUNTER — Ambulatory Visit (INDEPENDENT_AMBULATORY_CARE_PROVIDER_SITE_OTHER): Payer: 59 | Admitting: Ophthalmology

## 2023-07-04 DIAGNOSIS — Z794 Long term (current) use of insulin: Secondary | ICD-10-CM

## 2023-07-04 DIAGNOSIS — E113513 Type 2 diabetes mellitus with proliferative diabetic retinopathy with macular edema, bilateral: Secondary | ICD-10-CM | POA: Diagnosis not present

## 2023-07-04 DIAGNOSIS — H34811 Central retinal vein occlusion, right eye, with macular edema: Secondary | ICD-10-CM | POA: Diagnosis not present

## 2023-07-04 DIAGNOSIS — Z7984 Long term (current) use of oral hypoglycemic drugs: Secondary | ICD-10-CM | POA: Diagnosis not present

## 2023-07-04 DIAGNOSIS — I10 Essential (primary) hypertension: Secondary | ICD-10-CM

## 2023-07-04 DIAGNOSIS — H40051 Ocular hypertension, right eye: Secondary | ICD-10-CM

## 2023-07-04 DIAGNOSIS — H35033 Hypertensive retinopathy, bilateral: Secondary | ICD-10-CM

## 2023-07-04 DIAGNOSIS — Z9889 Other specified postprocedural states: Secondary | ICD-10-CM

## 2023-07-04 DIAGNOSIS — Z961 Presence of intraocular lens: Secondary | ICD-10-CM

## 2023-07-04 MED ORDER — AFLIBERCEPT 2MG/0.05ML IZ SOLN FOR KALEIDOSCOPE
2.0000 mg | INTRAVITREAL | Status: AC | PRN
  Administered 2023-07-04: 2 mg via INTRAVITREAL

## 2023-07-31 NOTE — Progress Notes (Signed)
 [facility] - Clinic Note  08/08/2023     CHIEF COMPLAINT Patient presents for Retina Follow Up   HISTORY OF PRESENT ILLNESS: Ann Gomez is a 73 y.o. female who presents to the clinic today for:   HPI     Retina Follow Up   Patient presents with  CRVO/BRVO.  In right eye.  Severity is moderate.  Duration of 5 weeks.  Since onset it is stable.  I, the attending physician,  performed the HPI with the patient and updated documentation appropriately.        Comments   5 week Retina eval. Patient states vision is doing pretty good. Vision checked with J card      Last edited by Ronelle Coffee, MD on 08/08/2023 12:05 PM.    Patient states the vision is stable  Referring physician: Harry Lindau, FNP [address] [place],  [place] [address]  HISTORICAL INFORMATION:   Selected notes from the MEDICAL RECORD NUMBER Dr. Seward Dao pt here due to insurance reasons LEE:  Ocular Hx- PMH-    CURRENT MEDICATIONS: Current Outpatient Medications (Ophthalmic Drugs)  Medication Sig   brimonidine (ALPHAGAN) 0.2 % ophthalmic solution Place 1 drop into the right eye 2 (two) times daily.   dorzolamide-timolol (COSOPT) 22.3-6.8 MG/ML ophthalmic solution Place 1 drop into both eyes 2 (two) times daily.   latanoprost (XALATAN) 0.005 % ophthalmic solution Place 1 drop into both eyes at bedtime.   Latanoprostene Bunod (VYZULTA) 0.024 % SOLN Place 1 drop into both eyes at bedtime. QHS OU   No current facility-administered medications for this visit. (Ophthalmic Drugs)   Current Outpatient Medications (Other)  Medication Sig   amLODipine (NORVASC) 5 MG tablet Take 1 tablet (5 mg total) by mouth daily.   aspirin EC 81 MG tablet Take 1 tablet (81 mg total) by mouth daily.   Cholecalciferol (VITAMIN D3 PO) Take 1 capsule by mouth daily.   Ginger, Zingiber officinalis, (GINGER ROOT PO) Take 550 mg by mouth daily.   glipiZIDE (GLUCOTROL) 10 MG tablet Take 10 mg by mouth in  the morning and at bedtime.   hydrALAZINE (APRESOLINE) 25 MG tablet Take 25 mg by mouth 3 (three) times daily.   losartan (COZAAR) 100 MG tablet Take 100 mg by mouth daily.   mupirocin ointment (BACTROBAN) 2 % APPLY 1 APPLICATION TOPICALLY DAILY   pravastatin (PRAVACHOL) 20 MG tablet Take 20 mg by mouth at bedtime.   chlorthalidone (HYGROTON) 25 MG tablet Take 1 tablet (25 mg total) by mouth daily.   LEVEMIR FLEXTOUCH 100 UNIT/ML FlexTouch Pen Inject 17 Units into the skin 2 (two) times daily.   No current facility-administered medications for this visit. (Other)   REVIEW OF SYSTEMS: ROS   Positive for: Endocrine, Cardiovascular, Eyes Negative for: Constitutional, Gastrointestinal, Neurological, Skin, Genitourinary, Musculoskeletal, HENT, Respiratory, Psychiatric, Allergic/Imm, Heme/Lymph Last edited by Leonia Raman, COT on 08/08/2023  9:39 AM.     ALLERGIES Allergies  Allergen Reactions   Hydrochlorothiazide Other (See Comments)    Dizziness   PAST MEDICAL HISTORY Past Medical History:  Diagnosis Date   Diabetes mellitus    Edema    Essential hypertension, benign    Hyperlipidemia    Obesity, unspecified    Past Surgical History:  Procedure Laterality Date   AMPUTATION Right 11/20/2021   Procedure: AMPUTATION GREAT TOE;  Surgeon: Floyce Hutching, DPM;  Location: [facility] OR;  Service: Podiatry;  Laterality: Right;   CATARACT EXTRACTION  CESAREAN SECTION     EYE SURGERY     FAMILY HISTORY Family History  Family history unknown: Yes   SOCIAL HISTORY Social History   Tobacco Use   Smoking status: Never   Smokeless tobacco: Never  Vaping Use   Vaping status: Never Used  Substance Use Topics   Alcohol use: Yes    Comment: occasional   Drug use: No       OPHTHALMIC EXAM:  Base Eye Exam     Visual Acuity (Snellen - Linear)       Right Left   Dist [place] 20/400 20/30 -1   Dist ph [place] 20/200 -2 20/NI         Tonometry (Tonopen, 9:48 AM)       Right Left    Pressure 18 16         Pupils       Dark Light Shape React APD   Right 3 2 Round Brisk None   Left 3 2 Round Brisk None         Visual Fields (Counting fingers)       Left Right    Full Full         Extraocular Movement       Right Left    Full, Ortho Full, Ortho         Neuro/Psych     Oriented x3: Yes   Mood/Affect: Normal         Dilation     Both eyes: 1.0% Mydriacyl, 2.5% Phenylephrine @ 9:48 AM           Slit Lamp and Fundus Exam     Slit Lamp Exam       Right Left   Lids/Lashes Dermatochalasis - upper lid Dermatochalasis - upper lid   Conjunctiva/Sclera Melanosis Melanosis   Cornea 3+ Punctate epithelial erosions, well healed cataract wound arcus, trace PEE, mild endo pigment, well healed cataract wound   Anterior Chamber deep, clear, narrow angles deep, clear, narrow angles   Iris Round and dilated Round and dilated   Lens PC IOL in good position with open PC PC IOL in good position   Anterior Vitreous mild syneresis post vitrectomy         Fundus Exam       Right Left   Disc 2-3+Pallor, Sharp rim 2+ Pallor, Sharp rim   C/D Ratio 0.4 0.5   Macula Flat, Blunted foveal reflex, scattered IRH/DBH and exudates, focal cystic changes nasal macula, diffuse atrophy Flat, Blunted foveal reflex, scattered MA/DBH greatest temporally, central cystic changes / edema -- persistent, dense laser temporal macula   Vessels attenuated, Tortuous attenuated, Tortuous   Periphery Attached, scattered MA/DBH and exudates Attached, scattered MA / DBH, scattered patches of PRP with room for fill in           IMAGING AND PROCEDURES  Imaging and Procedures for 08/08/2023  OCT, Retina - OU - Both Eyes       Right Eye Quality was good. Scan locations included subfoveal. Central Foveal Thickness: 202. Progression has been stable. Findings include normal foveal contour, no SRF, intraretinal hyper-reflective material, intraretinal fluid, inner retinal atrophy,  vitreomacular adhesion (Mild cystic changes nasal mac, diffuse IRA, patchy ORA ).   Left Eye Quality was good. Scan locations included subfoveal. Central Foveal Thickness: 346. Progression has worsened. Findings include no SRF, abnormal foveal contour, subretinal hyper-reflective material, epiretinal membrane, intraretinal fluid, subretinal fluid (Persistent central cystic changes -- slightly increased).  Notes *Images captured and stored on drive  Diagnosis / Impression:  OD: CRVO -- Mild cystic changes nasal mac, diffuse IRA, patchy ORA   OS: +DME -- Persistent central cystic changes -- slightly increased  Clinical management:  See below  Abbreviations: NFP - Normal foveal profile. CME - cystoid macular edema. PED - pigment epithelial detachment. IRF - intraretinal fluid. SRF - subretinal fluid. EZ - ellipsoid zone. ERM - epiretinal membrane. ORA - outer retinal atrophy. ORT - outer retinal tubulation. SRHM - subretinal hyper-reflective material. IRHM - intraretinal hyper-reflective material      Intravitreal Injection, Pharmacologic Agent - OD - Right Eye       Time Out 08/08/2023. 10:34 AM. Confirmed correct patient, procedure, site, and patient consented.   Anesthesia Topical anesthesia was used. Anesthetic medications included Lidocaine 2%, Proparacaine 0.5%.   Procedure Preparation included 5% betadine to ocular surface, eyelid speculum. A (32g) needle was used.   Injection: 2 mg aflibercept 2 MG/0.05ML   Route: Intravitreal, Site: Right Eye   NDC: Q956576, Lot: [id number], Expiration date: 09/21/2024, Waste: 0 mL   Post-op Post injection exam found visual acuity of at least counting fingers. The patient tolerated the procedure well. There were no complications. The patient received written and verbal post procedure care education. Post injection medications were not given.      Intravitreal Injection, Pharmacologic Agent - OS - Left Eye       Time  Out 08/08/2023. 10:35 AM. Confirmed correct patient, procedure, site, and patient consented.   Anesthesia Topical anesthesia was used. Anesthetic medications included Lidocaine 2%, Proparacaine 0.5%.   Procedure Preparation included 5% betadine to ocular surface, eyelid speculum. A (32g) needle was used.   Injection: 2 mg aflibercept 2 MG/0.05ML   Route: Intravitreal, Site: Left Eye   NDC: Q956576, Lot: [id number], Expiration date: 08/22/2024, Waste: 0 mL   Post-op Post injection exam found visual acuity of at least counting fingers. The patient tolerated the procedure well. There were no complications. The patient received written and verbal post procedure care education.            ASSESSMENT/PLAN:    ICD-10-CM   1. Central retinal vein occlusion with macular edema of right eye  H34.8110 OCT, Retina - OU - Both Eyes    Intravitreal Injection, Pharmacologic Agent - OD - Right Eye    aflibercept (EYLEA) SOLN 2 mg    2. Proliferative diabetic retinopathy of both eyes with macular edema associated with type 2 diabetes mellitus (HCC)  W10.2725 Intravitreal Injection, Pharmacologic Agent - OS - Left Eye    aflibercept (EYLEA) SOLN 2 mg    3. Long term (current) use of oral hypoglycemic drugs  Z79.84     4. Encounter for long-term (current) use of insulin (HCC)  Z79.4     5. History of vitrectomy  Z98.890     6. Essential hypertension  I10     7. Hypertensive retinopathy of both eyes  H35.033     8. Pseudophakia, both eyes  Z96.1     9. Ocular hypertension of right eye  H40.051      1. CRVO w/ CME, OD  - delayed f/u -- 6.9 wks instead of 4 (08.13.24 to 09.30.24 -- transportation issues)  - delayed f/u -- 3.5 mos instead of 4 wks due to insurance issues (3.19.24 to 07.08.24)  - history of multiple IVA OD w/ [facility] (last 06.06.23) - s/p IVA OD #1 (02.20.24), #2 (03.19.24) #3 (07.08.24), #4 (08.13.24), #5 (09.30.24), #  6 (10.30.24) -- IVA resistance - s/p IVE OD #1  (02.04.25), #2 (03.11.25)  - BCVA OD 20/200 from 20/300 - OCT shows  OD: Mild cystic changes nasal mac, diffuse IRA, patchy ORA at 5 weeks - recommend IVE OD #3 -- see below  - RBA of procedure discussed, questions answered  - IVE informed consent obtained and signed, 02.04.25 (OU) - see procedure note - f/u 4 wks -- DFE/OCT, possible injection  2-5. Proliferative diabetic retinopathy, both eyes  - A1C 9.2 on 04.08.25  - history of PPV w/ membrane peel and laser OS for PDR ([facility] - 06.28.23) - s/p IVA OU #1 (02.20.24), #2 (03.19.24), #3 (07.08.24), #4 (08.13.24), #5 (09.30.24), #6 (10.30.24), #7 (12.06.24), #8 (01.07.25) -- IVA resistance - s/p IVE OU #1 (02.04.25), #2 (03.11.25) - exam shows scattered MA, DBH and preretinal heme OU; OS with PRP 360 - FA (02.20.24) shows OD: fine NVD; scattered patches of vascular nonperfusion; enlarged FAZ; diffuse leaking MA; OS: Dense cluster of leaking MA's ST mac; staining of PRP laser 360; no active NV - OCT shows OD: Mild cystic changes nasal mac, diffuse IRA, patchy ORA; OS: Persistent central cystic changes -- slightly increased at 5 weeks - recommend IVE OU #3 today, 04.15.25 for DME w/ f/u back to 4 wks - pt wishes to proceed with injections - RBA of procedure discussed, questions answered - Eylea informed consent obtained and signed OU 02.04.25 - see procedure note - f/u in 4 wks -- DFE/OCT, possible injection  6,7. Hypertensive retinopathy OU - discussed importance of tight BP control - continue to monitor  8. Pseudophakia OU  - s/p CE/IOL OU  - IOL in good position, doing well  - continue to monitor  9. Ocular Hypertension OD  - IOP today: 18,16  - on Cosopt BID OU and Latanoprost QHS OU per Dr. Candi Chafe  - started Brimonidine bid OD only in March 2024  Ophthalmic Meds Ordered this visit:  Meds ordered this encounter  Medications   aflibercept (EYLEA) SOLN 2 mg   aflibercept (EYLEA) SOLN 2 mg     Return in about 4 weeks  (around 09/05/2023) for f/u PDR OU, DFE, OCT, Possible Injxn.  There are no Patient Instructions on file for this visit.   This document serves as a record of services personally performed by Jeanice Millard, MD, PhD. It was created on their behalf by Olene Berne, COT an ophthalmic technician. The creation of this record is the provider's dictation and/or activities during the visit.    Electronically signed by:  Olene Berne, COT  08/08/23 12:10 PM  This document serves as a record of services personally performed by Jeanice Millard, MD, PhD. It was created on their behalf by Morley Arabia. Bevin Bucks, OA an ophthalmic technician. The creation of this record is the provider's dictation and/or activities during the visit.    Electronically signed by: Morley Arabia. Bevin Bucks, OA 08/08/23 12:10 PM   Jeanice Millard, M.D., Ph.D. Diseases & Surgery of the Retina and Vitreous [facility]  I have reviewed the above documentation for accuracy and completeness, and I agree with the above. Jeanice Millard, M.D., Ph.D. 08/08/23 12:13 PM   Abbreviations: M myopia (nearsighted); A astigmatism; H hyperopia (farsighted); P presbyopia; Mrx spectacle prescription;  CTL contact lenses; OD right eye; OS left eye; OU both eyes  XT exotropia; ET esotropia; PEK punctate epithelial keratitis; PEE punctate epithelial erosions; DES dry eye syndrome; MGD meibomian  gland dysfunction; ATs artificial tears; PFAT's preservative free artificial tears; NSC nuclear sclerotic cataract; PSC posterior subcapsular cataract; ERM epi-retinal membrane; PVD posterior vitreous detachment; RD retinal detachment; DM diabetes mellitus; DR diabetic retinopathy; NPDR non-proliferative diabetic retinopathy; PDR proliferative diabetic retinopathy; CSME clinically significant macular edema; DME diabetic macular edema; dbh dot blot hemorrhages; CWS cotton wool spot; POAG primary open angle glaucoma; C/D cup-to-disc ratio; HVF  humphrey visual field; GVF goldmann visual field; OCT optical coherence tomography; IOP intraocular pressure; BRVO Branch retinal vein occlusion; CRVO central retinal vein occlusion; CRAO central retinal artery occlusion; BRAO branch retinal artery occlusion; RT retinal tear; SB scleral buckle; PPV pars plana vitrectomy; VH Vitreous hemorrhage; PRP panretinal laser photocoagulation; IVK intravitreal kenalog; VMT vitreomacular traction; MH Macular hole;  NVD neovascularization of the disc; NVE neovascularization elsewhere; AREDS age related eye disease study; ARMD age related macular degeneration; POAG primary open angle glaucoma; EBMD epithelial/anterior basement membrane dystrophy; ACIOL anterior chamber intraocular lens; IOL intraocular lens; PCIOL posterior chamber intraocular lens; Phaco/IOL phacoemulsification with intraocular lens placement; PRK photorefractive keratectomy; LASIK laser assisted in situ keratomileusis; HTN hypertension; DM diabetes mellitus; COPD chronic obstructive pulmonary disease

## 2023-08-08 ENCOUNTER — Ambulatory Visit (INDEPENDENT_AMBULATORY_CARE_PROVIDER_SITE_OTHER): Admitting: Ophthalmology

## 2023-08-08 ENCOUNTER — Encounter (INDEPENDENT_AMBULATORY_CARE_PROVIDER_SITE_OTHER): Payer: Self-pay | Admitting: Ophthalmology

## 2023-08-08 DIAGNOSIS — I10 Essential (primary) hypertension: Secondary | ICD-10-CM

## 2023-08-08 DIAGNOSIS — E113513 Type 2 diabetes mellitus with proliferative diabetic retinopathy with macular edema, bilateral: Secondary | ICD-10-CM

## 2023-08-08 DIAGNOSIS — Z7984 Long term (current) use of oral hypoglycemic drugs: Secondary | ICD-10-CM | POA: Diagnosis not present

## 2023-08-08 DIAGNOSIS — Z794 Long term (current) use of insulin: Secondary | ICD-10-CM

## 2023-08-08 DIAGNOSIS — Z9889 Other specified postprocedural states: Secondary | ICD-10-CM

## 2023-08-08 DIAGNOSIS — H35033 Hypertensive retinopathy, bilateral: Secondary | ICD-10-CM

## 2023-08-08 DIAGNOSIS — H34811 Central retinal vein occlusion, right eye, with macular edema: Secondary | ICD-10-CM | POA: Diagnosis not present

## 2023-08-08 DIAGNOSIS — H40051 Ocular hypertension, right eye: Secondary | ICD-10-CM

## 2023-08-08 DIAGNOSIS — Z961 Presence of intraocular lens: Secondary | ICD-10-CM

## 2023-08-08 MED ORDER — AFLIBERCEPT 2MG/0.05ML IZ SOLN FOR KALEIDOSCOPE
2.0000 mg | INTRAVITREAL | Status: AC | PRN
  Administered 2023-08-08: 2 mg via INTRAVITREAL

## 2023-08-23 NOTE — Progress Notes (Shared)
 [facility] - Clinic Note  09/05/2023     CHIEF COMPLAINT Patient presents for No chief complaint on file.   HISTORY OF PRESENT ILLNESS: Ann Gomez is a 73 y.o. female who presents to the clinic today for:    Patient states the vision is stable  Referring physician: Harry Lindau, FNP [address] [place],  [place] [address]  HISTORICAL INFORMATION:   Selected notes from the MEDICAL RECORD NUMBER Dr. Seward Dao pt here due to insurance reasons LEE:  Ocular Hx- PMH-    CURRENT MEDICATIONS: Current Outpatient Medications (Ophthalmic Drugs)  Medication Sig   brimonidine  (ALPHAGAN ) 0.2 % ophthalmic solution Place 1 drop into the right eye 2 (two) times daily.   dorzolamide-timolol (COSOPT) 22.3-6.8 MG/ML ophthalmic solution Place 1 drop into both eyes 2 (two) times daily.   latanoprost (XALATAN) 0.005 % ophthalmic solution Place 1 drop into both eyes at bedtime.   Latanoprostene Bunod (VYZULTA) 0.024 % SOLN Place 1 drop into both eyes at bedtime. QHS OU   No current facility-administered medications for this visit. (Ophthalmic Drugs)   Current Outpatient Medications (Other)  Medication Sig   amLODipine  (NORVASC ) 5 MG tablet Take 1 tablet (5 mg total) by mouth daily.   aspirin  EC 81 MG tablet Take 1 tablet (81 mg total) by mouth daily.   chlorthalidone  (HYGROTON ) 25 MG tablet Take 1 tablet (25 mg total) by mouth daily.   Cholecalciferol (VITAMIN D3 PO) Take 1 capsule by mouth daily.   Ginger, Zingiber officinalis, (GINGER ROOT PO) Take 550 mg by mouth daily.   glipiZIDE (GLUCOTROL) 10 MG tablet Take 10 mg by mouth in the morning and at bedtime.   hydrALAZINE  (APRESOLINE ) 25 MG tablet Take 25 mg by mouth 3 (three) times daily.   LEVEMIR FLEXTOUCH 100 UNIT/ML FlexTouch Pen Inject 17 Units into the skin 2 (two) times daily.   losartan  (COZAAR ) 100 MG tablet Take 100 mg by mouth daily.   mupirocin  ointment (BACTROBAN ) 2 % APPLY 1 APPLICATION TOPICALLY  DAILY   pravastatin  (PRAVACHOL ) 20 MG tablet Take 20 mg by mouth at bedtime.   No current facility-administered medications for this visit. (Other)   REVIEW OF SYSTEMS:   ALLERGIES Allergies  Allergen Reactions   Hydrochlorothiazide Other (See Comments)    Dizziness   PAST MEDICAL HISTORY Past Medical History:  Diagnosis Date   Diabetes mellitus    Edema    Essential hypertension, benign    Hyperlipidemia    Obesity, unspecified    Past Surgical History:  Procedure Laterality Date   AMPUTATION Right 11/20/2021   Procedure: AMPUTATION GREAT TOE;  Surgeon: Floyce Hutching, DPM;  Location: [facility] OR;  Service: Podiatry;  Laterality: Right;   CATARACT EXTRACTION     CESAREAN SECTION     EYE SURGERY     FAMILY HISTORY Family History  Family history unknown: Yes   SOCIAL HISTORY Social History   Tobacco Use   Smoking status: Never   Smokeless tobacco: Never  Vaping Use   Vaping status: Never Used  Substance Use Topics   Alcohol use: Yes    Comment: occasional   Drug use: No       OPHTHALMIC EXAM:  Not recorded    IMAGING AND PROCEDURES  Imaging and Procedures for 09/05/2023          ASSESSMENT/PLAN:    ICD-10-CM   1. Central retinal vein occlusion with macular edema of right eye  H34.8110  2. Proliferative diabetic retinopathy of both eyes with macular edema associated with type 2 diabetes mellitus (HCC)  E33.2951     3. Long term (current) use of oral hypoglycemic drugs  Z79.84     4. Encounter for long-term (current) use of insulin  (HCC)  Z79.4     5. History of vitrectomy  Z98.890     6. Essential hypertension  I10     7. Hypertensive retinopathy of both eyes  H35.033     8. Pseudophakia, both eyes  Z96.1     9. Ocular hypertension of right eye  H40.051       1. CRVO w/ CME, OD  - delayed f/u -- 6.9 wks instead of 4 (08.13.24 to 09.30.24 -- transportation issues)  - delayed f/u -- 3.5 mos instead of 4 wks due to insurance issues  (3.19.24 to 07.08.24)  - history of multiple IVA OD w/ [facility] (last 06.06.23) - s/p IVA OD #1 (02.20.24), #2 (03.19.24) #3 (07.08.24), #4 (08.13.24), #5 (09.30.24), #6 (10.30.24) -- IVA resistance - s/p IVE OD #1 (02.04.25), #2 (03.11.25)  - BCVA OD 20/200 from 20/300 - OCT shows  OD: Mild cystic changes nasal mac, diffuse IRA, patchy ORA at 5 weeks - recommend IVE OD #3 -- see below  - RBA of procedure discussed, questions answered  - IVE informed consent obtained and signed, 02.04.25 (OU) - see procedure note - f/u 4 wks -- DFE/OCT, possible injection  2-5. Proliferative diabetic retinopathy, both eyes  - A1C 9.2 on 04.08.25  - history of PPV w/ membrane peel and laser OS for PDR ([facility] - 06.28.23) - s/p IVA OU #1 (02.20.24), #2 (03.19.24), #3 (07.08.24), #4 (08.13.24), #5 (09.30.24), #6 (10.30.24), #7 (12.06.24), #8 (01.07.25) -- IVA resistance - s/p IVE OU #1 (02.04.25), #2 (03.11.25), #3 (04.15.25) - exam shows scattered MA, DBH and preretinal heme OU; OS with PRP 360 - FA (02.20.24) shows OD: fine NVD; scattered patches of vascular nonperfusion; enlarged FAZ; diffuse leaking MA; OS: Dense cluster of leaking MA's ST mac; staining of PRP laser 360; no active NV - OCT shows OD: Mild cystic changes nasal mac, diffuse IRA, patchy ORA; OS: Persistent central cystic changes -- slightly increased at 5 weeks - recommend IVE OU #4 today, 05.13.25 for DME w/ f/u back to 4 wks - pt wishes to proceed with injections - RBA of procedure discussed, questions answered - Eylea  informed consent obtained and signed OU 02.04.25 - see procedure note - f/u in 4 wks -- DFE/OCT, possible injection  6,7. Hypertensive retinopathy OU - discussed importance of tight BP control - continue to monitor  8. Pseudophakia OU  - s/p CE/IOL OU  - IOL in good position, doing well  - continue to monitor  9. Ocular Hypertension OD  - IOP today: 18,16  - on Cosopt BID OU and Latanoprost QHS OU per Dr. Candi Chafe  -  started Brimonidine  bid OD only in March 2024  Ophthalmic Meds Ordered this visit:  No orders of the defined types were placed in this encounter.    No follow-ups on file.  There are no Patient Instructions on file for this visit.   This document serves as a record of services personally performed by Jeanice Millard, MD, PhD. It was created on their behalf by Olene Berne, COT an ophthalmic technician. The creation of this record is the provider's dictation and/or activities during the visit.    Electronically signed by:  Olene Berne, COT  08/23/23 7:47  AM   Jeanice Millard, M.D., Ph.D. Diseases & Surgery of the Retina and Vitreous [facility]   Abbreviations: M myopia (nearsighted); A astigmatism; H hyperopia (farsighted); P presbyopia; Mrx spectacle prescription;  CTL contact lenses; OD right eye; OS left eye; OU both eyes  XT exotropia; ET esotropia; PEK punctate epithelial keratitis; PEE punctate epithelial erosions; DES dry eye syndrome; MGD meibomian gland dysfunction; ATs artificial tears; PFAT's preservative free artificial tears; NSC nuclear sclerotic cataract; PSC posterior subcapsular cataract; ERM epi-retinal membrane; PVD posterior vitreous detachment; RD retinal detachment; DM diabetes mellitus; DR diabetic retinopathy; NPDR non-proliferative diabetic retinopathy; PDR proliferative diabetic retinopathy; CSME clinically significant macular edema; DME diabetic macular edema; dbh dot blot hemorrhages; CWS cotton wool spot; POAG primary open angle glaucoma; C/D cup-to-disc ratio; HVF humphrey visual field; GVF goldmann visual field; OCT optical coherence tomography; IOP intraocular pressure; BRVO Branch retinal vein occlusion; CRVO central retinal vein occlusion; CRAO central retinal artery occlusion; BRAO branch retinal artery occlusion; RT retinal tear; SB scleral buckle; PPV pars plana vitrectomy; VH Vitreous hemorrhage; PRP panretinal laser  photocoagulation; IVK intravitreal kenalog; VMT vitreomacular traction; MH Macular hole;  NVD neovascularization of the disc; NVE neovascularization elsewhere; AREDS age related eye disease study; ARMD age related macular degeneration; POAG primary open angle glaucoma; EBMD epithelial/anterior basement membrane dystrophy; ACIOL anterior chamber intraocular lens; IOL intraocular lens; PCIOL posterior chamber intraocular lens; Phaco/IOL phacoemulsification with intraocular lens placement; PRK photorefractive keratectomy; LASIK laser assisted in situ keratomileusis; HTN hypertension; DM diabetes mellitus; COPD chronic obstructive pulmonary disease

## 2023-08-25 ENCOUNTER — Ambulatory Visit: Payer: 59 | Admitting: Cardiovascular Disease

## 2023-09-01 ENCOUNTER — Ambulatory Visit: Payer: 59 | Admitting: Podiatry

## 2023-09-05 ENCOUNTER — Encounter (INDEPENDENT_AMBULATORY_CARE_PROVIDER_SITE_OTHER): Admitting: Ophthalmology

## 2023-09-05 DIAGNOSIS — Z961 Presence of intraocular lens: Secondary | ICD-10-CM

## 2023-09-05 DIAGNOSIS — H40051 Ocular hypertension, right eye: Secondary | ICD-10-CM

## 2023-09-05 DIAGNOSIS — Z9889 Other specified postprocedural states: Secondary | ICD-10-CM

## 2023-09-05 DIAGNOSIS — H34811 Central retinal vein occlusion, right eye, with macular edema: Secondary | ICD-10-CM

## 2023-09-05 DIAGNOSIS — H35033 Hypertensive retinopathy, bilateral: Secondary | ICD-10-CM

## 2023-09-05 DIAGNOSIS — Z7984 Long term (current) use of oral hypoglycemic drugs: Secondary | ICD-10-CM

## 2023-09-05 DIAGNOSIS — E113513 Type 2 diabetes mellitus with proliferative diabetic retinopathy with macular edema, bilateral: Secondary | ICD-10-CM

## 2023-09-05 DIAGNOSIS — Z794 Long term (current) use of insulin: Secondary | ICD-10-CM

## 2023-09-05 DIAGNOSIS — I10 Essential (primary) hypertension: Secondary | ICD-10-CM

## 2023-09-06 ENCOUNTER — Ambulatory Visit: Admitting: Podiatry

## 2023-09-07 NOTE — Progress Notes (Signed)
 [facility] - Clinic Note  09/08/2023     CHIEF COMPLAINT Patient presents for Retina Follow Up   HISTORY OF PRESENT ILLNESS: Ann Gomez is a 73 y.o. female who presents to the clinic today for:   HPI     Retina Follow Up   Patient presents with  Diabetic Retinopathy.  In both eyes.  This started 4 weeks ago.  I, the attending physician,  performed the HPI with the patient and updated documentation appropriately.        Comments   Patient here for 4 weeks retina follow up for PDR OU. Patient states vision doing better. Still using drops. No eye pain.      Last edited by Ronelle Coffee, MD on 09/08/2023  3:35 PM.     Patient states the vision is stable  Referring physician: Harry Lindau, FNP [address] [place],  [place] [address]  HISTORICAL INFORMATION:   Selected notes from the MEDICAL RECORD NUMBER Dr. Seward Dao pt here due to insurance reasons LEE:  Ocular Hx- PMH-    CURRENT MEDICATIONS: Current Outpatient Medications (Ophthalmic Drugs)  Medication Sig   brimonidine  (ALPHAGAN ) 0.2 % ophthalmic solution Place 1 drop into the right eye 2 (two) times daily.   dorzolamide-timolol (COSOPT) 22.3-6.8 MG/ML ophthalmic solution Place 1 drop into both eyes 2 (two) times daily.   latanoprost (XALATAN) 0.005 % ophthalmic solution Place 1 drop into both eyes at bedtime.   Latanoprostene Bunod (VYZULTA) 0.024 % SOLN Place 1 drop into both eyes at bedtime. QHS OU   No current facility-administered medications for this visit. (Ophthalmic Drugs)   Current Outpatient Medications (Other)  Medication Sig   amLODipine  (NORVASC ) 5 MG tablet Take 1 tablet (5 mg total) by mouth daily.   aspirin  EC 81 MG tablet Take 1 tablet (81 mg total) by mouth daily.   Cholecalciferol (VITAMIN D3 PO) Take 1 capsule by mouth daily.   Ginger, Zingiber officinalis, (GINGER ROOT PO) Take 550 mg by mouth daily.   glipiZIDE (GLUCOTROL) 10 MG tablet Take 10 mg by mouth in  the morning and at bedtime.   hydrALAZINE  (APRESOLINE ) 25 MG tablet Take 25 mg by mouth 3 (three) times daily.   LEVEMIR FLEXTOUCH 100 UNIT/ML FlexTouch Pen Inject 17 Units into the skin 2 (two) times daily.   losartan  (COZAAR ) 100 MG tablet Take 100 mg by mouth daily.   mupirocin  ointment (BACTROBAN ) 2 % APPLY 1 APPLICATION TOPICALLY DAILY   pravastatin  (PRAVACHOL ) 20 MG tablet Take 20 mg by mouth at bedtime.   chlorthalidone  (HYGROTON ) 25 MG tablet Take 1 tablet (25 mg total) by mouth daily.   No current facility-administered medications for this visit. (Other)   REVIEW OF SYSTEMS: ROS   Positive for: Endocrine, Cardiovascular, Eyes Negative for: Constitutional, Gastrointestinal, Neurological, Skin, Genitourinary, Musculoskeletal, HENT, Respiratory, Psychiatric, Allergic/Imm, Heme/Lymph Last edited by Sylvan Evener, COA on 09/08/2023  8:43 AM.     ALLERGIES Allergies  Allergen Reactions   Hydrochlorothiazide Other (See Comments)    Dizziness   PAST MEDICAL HISTORY Past Medical History:  Diagnosis Date   Diabetes mellitus    Edema    Essential hypertension, benign    Hyperlipidemia    Obesity, unspecified    Past Surgical History:  Procedure Laterality Date   AMPUTATION Right 11/20/2021   Procedure: AMPUTATION GREAT TOE;  Surgeon: Floyce Hutching, DPM;  Location: [facility] OR;  Service: Podiatry;  Laterality: Right;   CATARACT EXTRACTION  CESAREAN SECTION     EYE SURGERY     FAMILY HISTORY Family History  Family history unknown: Yes   SOCIAL HISTORY Social History   Tobacco Use   Smoking status: Never   Smokeless tobacco: Never  Vaping Use   Vaping status: Never Used  Substance Use Topics   Alcohol use: Yes    Comment: occasional   Drug use: No       OPHTHALMIC EXAM:  Base Eye Exam     Visual Acuity (Snellen - Linear)       Right Left   Dist [place] 20/250 -1 20/25   Dist ph [place] 20/200 -2          Tonometry (Tonopen, 8:41 AM)       Right Left    Pressure 20 19         Pupils       Dark Light Shape React APD   Right 3 2 Round Brisk None   Left 3 2 Round Brisk None         Visual Fields (Counting fingers)       Left Right    Full Full         Extraocular Movement       Right Left    Full, Ortho Full, Ortho         Neuro/Psych     Oriented x3: Yes   Mood/Affect: Normal         Dilation     Both eyes: 1.0% Mydriacyl, 2.5% Phenylephrine @ 8:41 AM           Slit Lamp and Fundus Exam     Slit Lamp Exam       Right Left   Lids/Lashes Dermatochalasis - upper lid Dermatochalasis - upper lid   Conjunctiva/Sclera Melanosis Melanosis   Cornea 3+ Punctate epithelial erosions, well healed cataract wound arcus, trace PEE, mild endo pigment, well healed cataract wound   Anterior Chamber deep, clear, narrow angles deep, clear, narrow angles   Iris Round and dilated Round and dilated   Lens PC IOL in good position with open PC PC IOL in good position   Anterior Vitreous mild syneresis post vitrectomy         Fundus Exam       Right Left   Disc 2-3+Pallor, Sharp rim 2+ Pallor, Sharp rim   C/D Ratio 0.4 0.5   Macula Flat, Blunted foveal reflex, scattered IRH/DBH and exudates, focal cystic changes nasal macula, diffuse atrophy Flat, Blunted foveal reflex, scattered MA/DBH greatest temporally, central cystic changes / edema -- slightly improved, dense laser temporal macula   Vessels attenuated, Tortuous attenuated, Tortuous   Periphery Attached, scattered MA/DBH and exudates Attached, scattered MA / DBH, scattered patches of PRP with room for fill in           IMAGING AND PROCEDURES  Imaging and Procedures for 09/08/2023  OCT, Retina - OU - Both Eyes        Right Eye Quality was good. Scan locations included subfoveal. Central Foveal Thickness: 202. Progression has been stable. Findings include normal foveal contour, no SRF, intraretinal hyper-reflective material, intraretinal fluid, inner retinal  atrophy, vitreomacular adhesion (Mild cystic changes nasal mac, diffuse IRA, patchy ORA ).   Left Eye Quality was good. Scan locations included subfoveal. Central Foveal Thickness: 330. Progression has improved. Findings include no SRF, abnormal foveal contour, subretinal hyper-reflective material, epiretinal membrane, intraretinal fluid, subretinal fluid (Persistent central cystic changes -- slightly improved).  Notes  *Images captured and stored on drive  Diagnosis / Impression:  OD: CRVO -- Mild cystic changes nasal mac, diffuse IRA, patchy ORA   OS: +DME -- Persistent central cystic changes -- slightly improved  Clinical management:  See below  Abbreviations: NFP - Normal foveal profile. CME - cystoid macular edema. PED - pigment epithelial detachment. IRF - intraretinal fluid. SRF - subretinal fluid. EZ - ellipsoid zone. ERM - epiretinal membrane. ORA - outer retinal atrophy. ORT - outer retinal tubulation. SRHM - subretinal hyper-reflective material. IRHM - intraretinal hyper-reflective material      Intravitreal Injection, Pharmacologic Agent - OD - Right Eye       Time Out 09/08/2023. 9:24 AM. Confirmed correct patient, procedure, site, and patient consented.   Anesthesia Topical anesthesia was used. Anesthetic medications included Lidocaine  2%, Proparacaine 0.5%.   Procedure Preparation included 5% betadine to ocular surface, eyelid speculum. A (32g) needle was used.   Injection: 2 mg aflibercept  2 MG/0.05ML   Route: Intravitreal, Site: Right Eye   NDC: Q956576, Lot: [id number], Expiration date: 11/22/2024, Waste: 0 mL   Post-op Post injection exam found visual acuity of at least counting fingers. The patient tolerated the procedure well. There were no complications. The patient received written and verbal post procedure care education. Post injection medications were not given.      Intravitreal Injection, Pharmacologic Agent - OS - Left Eye       Time  Out 09/08/2023. 9:24 AM. Confirmed correct patient, procedure, site, and patient consented.   Anesthesia Topical anesthesia was used. Anesthetic medications included Lidocaine  2%, Proparacaine 0.5%.   Procedure Preparation included 5% betadine to ocular surface, eyelid speculum. A (32g) needle was used.   Injection: 2 mg aflibercept  2 MG/0.05ML   Route: Intravitreal, Site: Left Eye   NDC: Q956576, Lot: [id number], Expiration date: 10/22/2024, Waste: 0 mL   Post-op Post injection exam found visual acuity of at least counting fingers. The patient tolerated the procedure well. There were no complications. The patient received written and verbal post procedure care education.            ASSESSMENT/PLAN:    ICD-10-CM   1. Central retinal vein occlusion with macular edema of right eye  H34.8110 OCT, Retina - OU - Both Eyes    Intravitreal Injection, Pharmacologic Agent - OD - Right Eye    aflibercept  (EYLEA ) SOLN 2 mg    2. Proliferative diabetic retinopathy of both eyes with macular edema associated with type 2 diabetes mellitus (HCC)  E11.3513 OCT, Retina - OU - Both Eyes    Intravitreal Injection, Pharmacologic Agent - OS - Left Eye    aflibercept  (EYLEA ) SOLN 2 mg    3. Long term (current) use of oral hypoglycemic drugs  Z79.84     4. Encounter for long-term (current) use of insulin  (HCC)  Z79.4     5. History of vitrectomy  Z98.890     6. Essential hypertension  I10     7. Hypertensive retinopathy of both eyes  H35.033     8. Pseudophakia, both eyes  Z96.1     9. Ocular hypertension of right eye  H40.051      1. CRVO w/ CME, OD  - delayed f/u -- 6.9 wks instead of 4 (08.13.24 to 09.30.24 -- transportation issues)  - delayed f/u -- 3.5 mos instead of 4 wks due to insurance issues (3.19.24 to 07.08.24)  - history of multiple IVA OD w/ [facility] (last 06.06.23) - s/p IVA  OD #1 (02.20.24), #2 (03.19.24) #3 (07.08.24), #4 (08.13.24), #5 (09.30.24), #6 (10.30.24) -- IVA  resistance - s/p IVE OD #1 (02.04.25), #2 (03.11.25), #3 (04.15.25)  - BCVA OD 20/200 from 20/300 - OCT shows  OD: Mild cystic changes nasal mac, diffuse IRA, patchy ORA at 4+ weeks - recommend IVE OD #4 -- see below  - RBA of procedure discussed, questions answered  - IVE informed consent obtained and signed, 02.04.25 (OU) - see procedure note - f/u 4-5 wks -- DFE/OCT, possible injection  2-5. Proliferative diabetic retinopathy, both eyes  - A1C 9.2 on 04.08.25  - history of PPV w/ membrane peel and laser OS for PDR ([facility] - 06.28.23) - s/p IVA OU #1 (02.20.24), #2 (03.19.24), #3 (07.08.24), #4 (08.13.24), #5 (09.30.24), #6 (10.30.24), #7 (12.06.24), #8 (01.07.25) -- IVA resistance - s/p IVE OU #1 (02.04.25), #2 (03.11.25), #3 (04.15.25) - exam shows scattered MA, DBH and preretinal heme OU; OS with PRP 360 - FA (02.20.24) shows OD: fine NVD; scattered patches of vascular nonperfusion; enlarged FAZ; diffuse leaking MA; OS: Dense cluster of leaking MA's ST mac; staining of PRP laser 360; no active NV - OCT shows OD: Mild cystic changes nasal mac, diffuse IRA, patchy ORA; OS: Persistent central cystic changes -- slightly improved at 4+ weeks - recommend IVE OU #4 today, 05.16.25 for DME w/ f/u in 4-5 wks - pt wishes to proceed with injections - RBA of procedure discussed, questions answered - Eylea  informed consent obtained and signed OU 02.04.25 - see procedure note - f/u in 4-5 wks -- DFE/OCT, possible injection  6,7. Hypertensive retinopathy OU - discussed importance of tight BP control - continue to monitor  8. Pseudophakia OU  - s/p CE/IOL OU  - IOL in good position, doing well  - continue to monitor  9. Ocular Hypertension OD  - IOP today: 18,16  - on Cosopt BID OU and Latanoprost QHS OU per Dr. Candi Chafe  - started Brimonidine  bid OD only in March 2024  Ophthalmic Meds Ordered this visit:  Meds ordered this encounter  Medications   aflibercept  (EYLEA ) SOLN 2 mg    aflibercept  (EYLEA ) SOLN 2 mg     Return for f/u 4-5 weeks, PDR OU, DFE, OCT, Possible Injxn.  There are no Patient Instructions on file for this visit.  This document serves as a record of services personally performed by Jeanice Millard, MD, PhD. It was created on their behalf by Eller Gut COT, an ophthalmic technician. The creation of this record is the provider's dictation and/or activities during the visit.    Electronically signed by: Eller Gut COT 05.15.2025 12:30 AM  This document serves as a record of services personally performed by Jeanice Millard, MD, PhD. It was created on their behalf by Morley Arabia. Bevin Bucks, OA an ophthalmic technician. The creation of this record is the provider's dictation and/or activities during the visit.    Electronically signed by: Morley Arabia. Bevin Bucks, OA 09/13/23 12:30 AM  Jeanice Millard, M.D., Ph.D. Diseases & Surgery of the Retina and Vitreous [facility]  I have reviewed the above documentation for accuracy and completeness, and I agree with the above. Jeanice Millard, M.D., Ph.D. 09/13/23 12:32 AM   Abbreviations: M myopia (nearsighted); A astigmatism; H hyperopia (farsighted); P presbyopia; Mrx spectacle prescription;  CTL contact lenses; OD right eye; OS left eye; OU both eyes  XT exotropia; ET esotropia; PEK punctate epithelial keratitis; PEE punctate epithelial erosions; DES dry  eye syndrome; MGD meibomian gland dysfunction; ATs artificial tears; PFAT's preservative free artificial tears; NSC nuclear sclerotic cataract; PSC posterior subcapsular cataract; ERM epi-retinal membrane; PVD posterior vitreous detachment; RD retinal detachment; DM diabetes mellitus; DR diabetic retinopathy; NPDR non-proliferative diabetic retinopathy; PDR proliferative diabetic retinopathy; CSME clinically significant macular edema; DME diabetic macular edema; dbh dot blot hemorrhages; CWS cotton wool spot; POAG primary open angle  glaucoma; C/D cup-to-disc ratio; HVF humphrey visual field; GVF goldmann visual field; OCT optical coherence tomography; IOP intraocular pressure; BRVO Branch retinal vein occlusion; CRVO central retinal vein occlusion; CRAO central retinal artery occlusion; BRAO branch retinal artery occlusion; RT retinal tear; SB scleral buckle; PPV pars plana vitrectomy; VH Vitreous hemorrhage; PRP panretinal laser photocoagulation; IVK intravitreal kenalog; VMT vitreomacular traction; MH Macular hole;  NVD neovascularization of the disc; NVE neovascularization elsewhere; AREDS age related eye disease study; ARMD age related macular degeneration; POAG primary open angle glaucoma; EBMD epithelial/anterior basement membrane dystrophy; ACIOL anterior chamber intraocular lens; IOL intraocular lens; PCIOL posterior chamber intraocular lens; Phaco/IOL phacoemulsification with intraocular lens placement; PRK photorefractive keratectomy; LASIK laser assisted in situ keratomileusis; HTN hypertension; DM diabetes mellitus; COPD chronic obstructive pulmonary disease

## 2023-09-08 ENCOUNTER — Encounter (INDEPENDENT_AMBULATORY_CARE_PROVIDER_SITE_OTHER): Payer: Self-pay | Admitting: Ophthalmology

## 2023-09-08 ENCOUNTER — Ambulatory Visit (INDEPENDENT_AMBULATORY_CARE_PROVIDER_SITE_OTHER): Admitting: Ophthalmology

## 2023-09-08 DIAGNOSIS — Z7984 Long term (current) use of oral hypoglycemic drugs: Secondary | ICD-10-CM

## 2023-09-08 DIAGNOSIS — Z961 Presence of intraocular lens: Secondary | ICD-10-CM

## 2023-09-08 DIAGNOSIS — H35033 Hypertensive retinopathy, bilateral: Secondary | ICD-10-CM

## 2023-09-08 DIAGNOSIS — I10 Essential (primary) hypertension: Secondary | ICD-10-CM

## 2023-09-08 DIAGNOSIS — E113513 Type 2 diabetes mellitus with proliferative diabetic retinopathy with macular edema, bilateral: Secondary | ICD-10-CM

## 2023-09-08 DIAGNOSIS — Z794 Long term (current) use of insulin: Secondary | ICD-10-CM

## 2023-09-08 DIAGNOSIS — H34811 Central retinal vein occlusion, right eye, with macular edema: Secondary | ICD-10-CM

## 2023-09-08 DIAGNOSIS — Z9889 Other specified postprocedural states: Secondary | ICD-10-CM

## 2023-09-08 DIAGNOSIS — H40051 Ocular hypertension, right eye: Secondary | ICD-10-CM

## 2023-09-08 MED ORDER — AFLIBERCEPT 2MG/0.05ML IZ SOLN FOR KALEIDOSCOPE
2.0000 mg | INTRAVITREAL | Status: AC | PRN
  Administered 2023-09-08: 2 mg via INTRAVITREAL

## 2023-09-08 MED ORDER — AFLIBERCEPT 2MG/0.05ML IZ SOLN FOR KALEIDOSCOPE
2.0000 mg | INTRAVITREAL | Status: AC | PRN
Start: 2023-09-08 — End: 2023-09-08
  Administered 2023-09-08: 2 mg via INTRAVITREAL

## 2023-09-11 ENCOUNTER — Ambulatory Visit (INDEPENDENT_AMBULATORY_CARE_PROVIDER_SITE_OTHER): Admitting: Podiatry

## 2023-09-11 ENCOUNTER — Encounter: Payer: Self-pay | Admitting: Podiatry

## 2023-09-11 DIAGNOSIS — E1142 Type 2 diabetes mellitus with diabetic polyneuropathy: Secondary | ICD-10-CM

## 2023-09-11 DIAGNOSIS — B351 Tinea unguium: Secondary | ICD-10-CM

## 2023-09-11 DIAGNOSIS — M79675 Pain in left toe(s): Secondary | ICD-10-CM | POA: Diagnosis not present

## 2023-09-11 DIAGNOSIS — M79674 Pain in right toe(s): Secondary | ICD-10-CM

## 2023-09-11 NOTE — Progress Notes (Addendum)
 This patient returns to my office for at risk foot care.  This patient requires this care by a professional since this patient will be at risk due to having diabetes.  This patient is unable to cut nails himself since the patient cannot reach his nails.These nails are painful walking and wearing shoes.  Amputation right big toe. This patient presents for at risk foot care today.  General Appearance  Alert, conversant and in no acute stress.  Vascular  Dorsalis pedis and posterior tibial  pulses are  weakly palpable  bilaterally.  Capillary return is within normal limits  bilaterally. Temperature is within normal limits  bilaterally.  Neurologic  Senn-Weinstein monofilament wire test diminished   bilaterally. Muscle power within normal limits bilaterally.  Nails Thick disfigured discolored nails with subungual debris  from hallux to fifth toes bilaterally. No evidence of bacterial infection or drainage bilaterally.  Orthopedic  No limitations of motion  feet .  No crepitus or effusions noted.  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  Skin  normotropic skin with no porokeratosis noted bilaterally.  No signs of infections or ulcers noted.   [place] durum fifth toes  B/L asymptomatic.  Onychomycosis  Pain in right toes  Pain in left toes   HD fifth toes  B/L.  Consent was obtained for treatment procedures.   Mechanical debridement of nails 1-5  bilaterally performed with a nail nipper.  Filed with dremel without incident.      Return office visit     3 months                 Told patient to return for periodic foot care and evaluation due to potential at risk complications.   Ruffin Cotton DPM

## 2023-09-13 ENCOUNTER — Encounter (INDEPENDENT_AMBULATORY_CARE_PROVIDER_SITE_OTHER): Payer: Self-pay | Admitting: Ophthalmology

## 2023-09-15 ENCOUNTER — Encounter: Payer: Self-pay | Admitting: Nurse Practitioner

## 2023-09-15 ENCOUNTER — Ambulatory Visit: Attending: Nurse Practitioner | Admitting: Nurse Practitioner

## 2023-09-15 VITALS — BP 154/84 | HR 68 | Ht 65.5 in | Wt 206.4 lb

## 2023-09-15 DIAGNOSIS — I10 Essential (primary) hypertension: Secondary | ICD-10-CM | POA: Diagnosis not present

## 2023-09-15 DIAGNOSIS — N1831 Chronic kidney disease, stage 3a: Secondary | ICD-10-CM

## 2023-09-15 DIAGNOSIS — I340 Nonrheumatic mitral (valve) insufficiency: Secondary | ICD-10-CM | POA: Diagnosis not present

## 2023-09-15 DIAGNOSIS — E785 Hyperlipidemia, unspecified: Secondary | ICD-10-CM | POA: Diagnosis not present

## 2023-09-15 DIAGNOSIS — E119 Type 2 diabetes mellitus without complications: Secondary | ICD-10-CM

## 2023-09-15 MED ORDER — OLMESARTAN MEDOXOMIL 40 MG PO TABS: 40.0000 mg | ORAL_TABLET | Freq: Every day | ORAL | 3 refills | Status: DC

## 2023-09-15 MED ORDER — CARVEDILOL 12.5 MG PO TABS
12.5000 mg | ORAL_TABLET | Freq: Two times a day (BID) | ORAL | 3 refills | Status: AC
Start: 2023-09-15 — End: ?

## 2023-09-15 NOTE — Progress Notes (Unsigned)
 Office Visit    Patient Name: Ann Gomez Date of Encounter: 09/15/2023  Primary Care Provider:  Harry Lindau, FNP Primary Cardiologist:  Luana Rumple, MD  Chief Complaint    73 year old female with a history of mild mitral valve regurgitation, hypertension, hyperlipidemia, type 2 diabetes, s/p amputation of the right great toe, CKD stage III, and obesity who presents for follow-up related to hypertension.  Past Medical History    Past Medical History:  Diagnosis Date   Diabetes mellitus    Edema    Essential hypertension, benign    Hyperlipidemia    Obesity, unspecified    Past Surgical History:  Procedure Laterality Date   AMPUTATION Right 11/20/2021   Procedure: AMPUTATION GREAT TOE;  Surgeon: Floyce Hutching, DPM;  Location: [facility] OR;  Service: Podiatry;  Laterality: Right;   CATARACT EXTRACTION     CESAREAN SECTION     EYE SURGERY      Allergies  Allergies  Allergen Reactions   Hydrochlorothiazide Other (See Comments)    Dizziness     Labs/Other Studies Reviewed    The following studies were reviewed today:  Cardiac Studies & Procedures   ______________________________________________________________________________________________     ECHOCARDIOGRAM  ECHOCARDIOGRAM COMPLETE 03/08/2022  Narrative ECHOCARDIOGRAM REPORT    Patient Name:   Ann Gomez Date of Exam: 03/08/2022 Medical Rec #:  [id number]      Height:       63.5 in Accession #:    [id number]     Weight:       211.0 lb Date of Birth:  [birth year]      BSA:          1.990 m Patient Age:    71 years       BP:           154/84 mmHg Patient Gender: F              HR:           84 bpm. Exam Location:  [address]  Procedure: 2D Echo, Cardiac Doppler and Color Doppler  Indications:    R60.0 Bilateral Lower Extremity Edema.  History:        Patient has no prior history of Echocardiogram examinations. Signs/Symptoms:Edema; Risk Factors:Hypertension, Diabetes  and Dyslipidemia.  Sonographer:    Cheri Coria Rodgers-Jones RDCS Referring Phys: [id number] MIHAI CROITORU  IMPRESSIONS   1. Left ventricular ejection fraction, by estimation, is 60 to 65%. The left ventricle has normal function. The left ventricle has no regional wall motion abnormalities. Left ventricular diastolic parameters are consistent with Grade I diastolic dysfunction (impaired relaxation). 2. Right ventricular systolic function is normal. The right ventricular size is normal. There is mildly elevated pulmonary artery systolic pressure. 3. Left atrial size was mildly dilated. 4. Mild mitral valve regurgitation. 5. The aortic valve is tricuspid. Aortic valve regurgitation is not visualized. 6. The inferior vena cava is normal in size with greater than 50% respiratory variability, suggesting right atrial pressure of 3 mmHg.  Comparison(s): No prior Echocardiogram.  FINDINGS Left Ventricle: Left ventricular ejection fraction, by estimation, is 60 to 65%. The left ventricle has normal function. The left ventricle has no regional wall motion abnormalities. The left ventricular internal cavity size was normal in size. There is borderline left ventricular hypertrophy. Left ventricular diastolic parameters are consistent with Grade I diastolic dysfunction (impaired relaxation).  Right Ventricle: The right ventricular size is normal. Right ventricular systolic function is normal. There is mildly elevated pulmonary  artery systolic pressure. The tricuspid regurgitant velocity is 2.98 m/s, and with an assumed right atrial pressure of 3 mmHg, the estimated right ventricular systolic pressure is 38.5 mmHg.  Left Atrium: Left atrial size was mildly dilated.  Right Atrium: Right atrial size was normal in size.  Pericardium: There is no evidence of pericardial effusion.  Mitral Valve: There is mild thickening of the mitral valve leaflet(s). Mild mitral valve regurgitation.  Tricuspid Valve:  Tricuspid valve regurgitation is mild.  Aortic Valve: The aortic valve is tricuspid. Aortic valve regurgitation is not visualized.  Pulmonic Valve: Pulmonic valve regurgitation is not visualized.  Aorta: The aortic root and ascending aorta are structurally normal, with no evidence of dilitation.  Venous: The inferior vena cava is normal in size with greater than 50% respiratory variability, suggesting right atrial pressure of 3 mmHg.  IAS/Shunts: No atrial level shunt detected by color flow Doppler.   LEFT VENTRICLE PLAX 2D LVIDd:         4.40 cm   Diastology LVIDs:         3.10 cm   LV e' medial:    5.11 cm/s LV PW:         1.10 cm   LV E/e' medial:  19.5 LV IVS:        0.90 cm   LV e' lateral:   6.53 cm/s LVOT diam:     1.90 cm   LV E/e' lateral: 15.3 LV SV:         55 LV SV Index:   27 LVOT Area:     2.84 cm   RIGHT VENTRICLE             IVC RV Basal diam:  3.30 cm     IVC diam: 1.70 cm RV S prime:     16.50 cm/s TAPSE (M-mode): 2.9 cm  LEFT ATRIUM             Index        RIGHT ATRIUM           Index LA diam:        4.60 cm 2.31 cm/m   RA Area:     16.90 cm LA Vol (A2C):   68.0 ml 34.17 ml/m  RA Volume:   50.30 ml  25.28 ml/m LA Vol (A4C):   63.8 ml 32.06 ml/m LA Biplane Vol: 68.4 ml 34.38 ml/m AORTIC VALVE LVOT Vmax:   95.00 cm/s LVOT Vmean:  66.150 cm/s LVOT VTI:    0.192 m  AORTA Ao Root diam: 3.20 cm Ao Asc diam:  3.20 cm  MITRAL VALVE                TRICUSPID VALVE MV Area (PHT): 4.89 cm     TR Peak grad:   35.5 mmHg MV Decel Time: 155 msec     TR Vmax:        298.00 cm/s MV E velocity: 99.70 cm/s MV A velocity: 129.00 cm/s  SHUNTS MV E/A ratio:  0.77         Systemic VTI:  0.19 m Systemic Diam: 1.90 cm  Alois Arnt Electronically signed by Alois Arnt Signature Date/Time: 03/08/2022/12:08:41 PM    Final          ______________________________________________________________________________________________     Recent Labs: No  results found for requested labs within last 365 days.  Recent Lipid Panel    Component Value Date/Time   CHOL 148 07/08/2010 2320   TRIG 54 07/08/2010 2320  HDL 76 07/08/2010 2320   CHOLHDL 1.9 Ratio 07/08/2010 2320   VLDL 11 07/08/2010 2320   LDLCALC 61 07/08/2010 2320    History of Present Illness    73 year old female with the above past medical history including mild mitral valve regurgitation, hypertension, hyperlipidemia, type 2 diabetes, s/p amputation of the right great toe, CKD stage III, and obesity.  She has follow-up with cardiology (Dr. Alvis Ba) for management of hypertension.  She has multiple drug allergies.  Echocardiogram in 2023 showed EF 60 to 65%, G1 DD, mildly elevated PASP, mild LAE, mild MR.  She was last seen in the office on 06/28/2022 and was stable from a cardiac standpoint.  She was started on amlodipine  5 mg daily.  She was advised to follow-up with advanced hypertension clinic however, this never occurred.  She presents today for follow-up.  Since her last visit Wt Readings from Last 3 Encounters:  09/15/23 206 lb 6.4 oz (93.6 kg)  06/28/22 197 lb 6.4 oz (89.5 kg)  02/01/22 211 lb (95.7 kg)  Stable from a cardiac standpoint.  She has stable nonpitting bilateral lower extremity edema.  She stopped taking her amlodipine  in the setting of increased edema, she discontinued the amlodipine  a couple months ago and her symptoms have improved.  BP remains elevated above goal.  Will stop losartan  and start olmesartan 40 mg daily.  Will increase carvedilol to 12.5 mg twice daily.  If BP remains elevated above goal, consider referral to hypertension clinic Pharm.D.  Continue to monitor BP, HR, report SBP consistently less than 100, HR consistently less than 55 bpm.  She follows with nephrology.  Will plan for repeat BMET in 2 weeks, follow-up in 1 month.  Consider repeat echocardiogram, though currently she has no concerning symptoms. Weight has been stable.  If lower  extremity edema persists or worsens, consider increasing Lasix , will defer for now.  Pendant component, she states this is her baseline, otherwise euvolemic and well compensated.  Hypertension: Mitral valve regurgitation: Hyperlipidemia: Type 2 diabetes: CKD stage III: Disposition:  Home Medications    Current Outpatient Medications  Medication Sig Dispense Refill   aspirin  EC 81 MG tablet Take 1 tablet (81 mg total) by mouth daily. 30 tablet 3   brimonidine  (ALPHAGAN ) 0.2 % ophthalmic solution Place 1 drop into the right eye 2 (two) times daily. 10 mL 3   carvedilol (COREG) 6.25 MG tablet Take 6.25 mg by mouth 2 (two) times daily.     Cholecalciferol (VITAMIN D3 PO) Take 1 capsule by mouth daily.     dorzolamide-timolol (COSOPT) 22.3-6.8 MG/ML ophthalmic solution Place 1 drop into both eyes 2 (two) times daily.     empagliflozin (JARDIANCE) 10 MG TABS tablet Take 1 tablet by mouth daily.     furosemide  (LASIX ) 20 MG tablet Take 20 mg by mouth daily.     Ginger, Zingiber officinalis, (GINGER ROOT PO) Take 550 mg by mouth daily.     glipiZIDE (GLUCOTROL) 10 MG tablet Take 10 mg by mouth in the morning and at bedtime.     hydrALAZINE  (APRESOLINE ) 100 MG tablet Take 1 tablet by mouth 3 (three) times daily.     latanoprost (XALATAN) 0.005 % ophthalmic solution Place 1 drop into both eyes at bedtime.     Latanoprostene Bunod (VYZULTA) 0.024 % SOLN Place 1 drop into both eyes at bedtime. QHS OU     LEVEMIR FLEXTOUCH 100 UNIT/ML FlexTouch Pen Inject 17 Units into the skin 2 (two) times daily.  losartan  (COZAAR ) 100 MG tablet Take 100 mg by mouth daily.     mupirocin  ointment (BACTROBAN ) 2 % APPLY 1 APPLICATION TOPICALLY DAILY 22 g 0   No current facility-administered medications for this visit.     Review of Systems    ***.  All other systems reviewed and are otherwise negative except as noted above.    Physical Exam    VS:  BP (!) 154/84   Pulse 68   Ht 5' 5.5" (1.664 m)   Wt  206 lb 6.4 oz (93.6 kg)   SpO2 99%   BMI 33.82 kg/m  , BMI Body mass index is 33.82 kg/m.     GEN: Well nourished, well developed, in no acute distress. HEENT: normal. Neck: Supple, no JVD, carotid bruits, or masses. Cardiac: RRR, no murmurs, rubs, or gallops. No clubbing, cyanosis, edema.  Radials/DP/PT 2+ and equal bilaterally.  Respiratory:  Respirations regular and unlabored, clear to auscultation bilaterally. GI: Soft, nontender, nondistended, BS + x 4. MS: no deformity or atrophy. Skin: warm and dry, no rash. Neuro:  Strength and sensation are intact. Psych: Normal affect.  Accessory Clinical Findings    ECG personally reviewed by me today - EKG Interpretation Date/Time:  Friday Sep 15 2023 08:21:45 EDT Ventricular Rate:  68 PR Interval:  182 QRS Duration:  82 QT Interval:  426 QTC Calculation: 452 R Axis:   -9  Text Interpretation: Normal sinus rhythm Normal ECG When compared with ECG of 01-Jul-2020 13:58, PREVIOUS ECG IS PRESENT Confirmed by Marlana Silvan ([id number]) on 09/15/2023 8:45:30 AM  - no acute changes.   Lab Results  Component Value Date   WBC 3.1 (L) 12/12/2021   HGB 11.6 (L) 12/12/2021   HCT 35.7 (L) 12/12/2021   MCV 84.2 12/12/2021   PLT 174 12/12/2021   Lab Results  Component Value Date   CREATININE 1.25 (H) 12/12/2021   BUN 22 12/12/2021   NA 139 12/12/2021   K 4.0 12/12/2021   CL 109 12/12/2021   CO2 22 12/12/2021   Lab Results  Component Value Date   ALT 22 12/12/2021   AST 24 12/12/2021   ALKPHOS 63 12/12/2021   BILITOT 0.8 12/12/2021   Lab Results  Component Value Date   CHOL 148 07/08/2010   HDL 76 07/08/2010   LDLCALC 61 07/08/2010   TRIG 54 07/08/2010   CHOLHDL 1.9 Ratio 07/08/2010    Lab Results  Component Value Date   HGBA1C 7.8 (H) 11/19/2021    Assessment & Plan    1.  ***  HYPERTENSION CONTROL Vitals:   09/15/23 0827 09/15/23 0857  BP: (!) 162/84 (!) 154/84    The patient's blood pressure is elevated above target  today. {Click here if intervention needs to be changed Refresh Note :1}  In order to address the patient's elevated BP:       Jude Norton, NP 09/15/2023, 8:57 AM

## 2023-09-15 NOTE — Patient Instructions (Signed)
 Medication Instructions:  Decrease Carvedilol 12.5 mg twice daily Stop Losartan  as directed Start Olmesartan 40 mg daily  *If you need a refill on your cardiac medications before your next appointment, please call your pharmacy*  Lab Work: BMET in 2 weeks  Testing/Procedures: NONE ordered at this time of appointment   Follow-Up: At [facility], you and your health needs are our priority.  As part of our continuing mission to provide you with exceptional heart care, our providers are all part of one team.  This team includes your primary Cardiologist (physician) and Advanced Practice Providers or APPs (Physician Assistants and Nurse Practitioners) who all work together to provide you with the care you need, when you need it.  Your next appointment:   1 month(s)  Provider:   Ervin Heath, PA-C or Marlana Silvan, NP          We recommend signing up for the patient portal called "MyChart".  Sign up information is provided on this After Visit Summary.  MyChart is used to connect with patients for Virtual Visits (Telemedicine).  Patients are able to view lab/test results, encounter notes, upcoming appointments, etc.  Non-urgent messages can be sent to your provider as well.   To learn more about what you can do with MyChart, go to [email or web address].

## 2023-09-17 ENCOUNTER — Encounter: Payer: Self-pay | Admitting: Nurse Practitioner

## 2023-09-21 ENCOUNTER — Ambulatory Visit: Payer: Self-pay | Admitting: Nurse Practitioner

## 2023-09-21 LAB — BASIC METABOLIC PANEL WITH GFR
BUN/Creatinine Ratio: 19 (ref 12–28)
BUN: 34 mg/dL — ABNORMAL HIGH (ref 8–27)
CO2: 18 mmol/L — ABNORMAL LOW (ref 20–29)
Calcium: 9.3 mg/dL (ref 8.7–10.3)
Chloride: 104 mmol/L (ref 96–106)
Creatinine, Ser: 1.76 mg/dL — ABNORMAL HIGH (ref 0.57–1.00)
Glucose: 236 mg/dL — ABNORMAL HIGH (ref 70–99)
Potassium: 4.7 mmol/L (ref 3.5–5.2)
Sodium: 136 mmol/L (ref 134–144)
eGFR: 30 mL/min/{1.73_m2} — ABNORMAL LOW (ref >59)

## 2023-09-21 NOTE — Telephone Encounter (Signed)
 Spoke with pt. Pt was notified of lab results. Pt will continue current medication and f/u as planned. Pt voiced understanding.

## 2023-10-04 NOTE — Progress Notes (Shared)
 [facility] - Clinic Note  10/13/2023     CHIEF COMPLAINT Patient presents for No chief complaint on file.   HISTORY OF PRESENT ILLNESS: Ann Gomez is a 73 y.o. female who presents to the clinic today for:     Patient states the vision is stable  Referring physician: Harry Lindau, FNP [address] [place],  [place] [address]  HISTORICAL INFORMATION:   Selected notes from the MEDICAL RECORD NUMBER Dr. Seward Dao pt here due to insurance reasons LEE:  Ocular Hx- PMH-    CURRENT MEDICATIONS: Current Outpatient Medications (Ophthalmic Drugs)  Medication Sig   brimonidine  (ALPHAGAN ) 0.2 % ophthalmic solution Place 1 drop into the right eye 2 (two) times daily.   dorzolamide-timolol (COSOPT) 22.3-6.8 MG/ML ophthalmic solution Place 1 drop into both eyes 2 (two) times daily.   latanoprost (XALATAN) 0.005 % ophthalmic solution Place 1 drop into both eyes at bedtime.   Latanoprostene Bunod (VYZULTA) 0.024 % SOLN Place 1 drop into both eyes at bedtime. QHS OU   No current facility-administered medications for this visit. (Ophthalmic Drugs)   Current Outpatient Medications (Other)  Medication Sig   aspirin  EC 81 MG tablet Take 1 tablet (81 mg total) by mouth daily.   carvedilol  (COREG ) 12.5 MG tablet Take 1 tablet (12.5 mg total) by mouth 2 (two) times daily with a meal.   Cholecalciferol (VITAMIN D3 PO) Take 1 capsule by mouth daily.   empagliflozin (JARDIANCE) 10 MG TABS tablet Take 1 tablet by mouth daily.   furosemide  (LASIX ) 20 MG tablet Take 20 mg by mouth daily.   Ginger, Zingiber officinalis, (GINGER ROOT PO) Take 550 mg by mouth daily.   glipiZIDE (GLUCOTROL) 10 MG tablet Take 10 mg by mouth in the morning and at bedtime.   hydrALAZINE  (APRESOLINE ) 100 MG tablet Take 1 tablet by mouth 3 (three) times daily.   LEVEMIR FLEXTOUCH 100 UNIT/ML FlexTouch Pen Inject 17 Units into the skin 2 (two) times daily.   mupirocin  ointment (BACTROBAN ) 2 % APPLY  1 APPLICATION TOPICALLY DAILY   olmesartan  (BENICAR ) 40 MG tablet Take 1 tablet (40 mg total) by mouth daily.   No current facility-administered medications for this visit. (Other)   REVIEW OF SYSTEMS:   ALLERGIES Allergies  Allergen Reactions   Hydrochlorothiazide Other (See Comments)    Dizziness   PAST MEDICAL HISTORY Past Medical History:  Diagnosis Date   Diabetes mellitus    Edema    Essential hypertension, benign    Hyperlipidemia    Obesity, unspecified    Past Surgical History:  Procedure Laterality Date   AMPUTATION Right 11/20/2021   Procedure: AMPUTATION GREAT TOE;  Surgeon: Floyce Hutching, DPM;  Location: [facility] OR;  Service: Podiatry;  Laterality: Right;   CATARACT EXTRACTION     CESAREAN SECTION     EYE SURGERY     FAMILY HISTORY Family History  Family history unknown: Yes   SOCIAL HISTORY Social History   Tobacco Use   Smoking status: Never   Smokeless tobacco: Never  Vaping Use   Vaping status: Never Used  Substance Use Topics   Alcohol use: Yes    Comment: occasional   Drug use: No       OPHTHALMIC EXAM:  Not recorded    IMAGING AND PROCEDURES  Imaging and Procedures for 10/13/2023          ASSESSMENT/PLAN:  No diagnosis found.  1. CRVO w/ CME, OD  - delayed f/u --  6.9 wks instead of 4 (08.13.24 to 09.30.24 -- transportation issues)  - delayed f/u -- 3.5 mos instead of 4 wks due to insurance issues (3.19.24 to 07.08.24)  - history of multiple IVA OD w/ [facility] (last 06.06.23) - s/p IVA OD #1 (02.20.24), #2 (03.19.24) #3 (07.08.24), #4 (08.13.24), #5 (09.30.24), #6 (10.30.24) -- IVA resistance - s/p IVE OD #1 (02.04.25), #2 (03.11.25), #3 (04.15.25), #4 (05.16.25)  - BCVA OD 20/200 from 20/300 - OCT shows  OD: Mild cystic changes nasal mac, diffuse IRA, patchy ORA at 4+ weeks - recommend IVE OD #5 -- see below  - RBA of procedure discussed, questions answered  - IVE informed consent obtained and signed, 02.04.25 (OU) - see  procedure note - f/u 4-5 wks -- DFE/OCT, possible injection  2-5. Proliferative diabetic retinopathy, both eyes  - A1C 9.2 on 04.08.25  - history of PPV w/ membrane peel and laser OS for PDR ([facility] - 06.28.23) - s/p IVA OU #1 (02.20.24), #2 (03.19.24), #3 (07.08.24), #4 (08.13.24), #5 (09.30.24), #6 (10.30.24), #7 (12.06.24), #8 (01.07.25) -- IVA resistance - s/p IVE OU #1 (02.04.25), #2 (03.11.25), #3 (04.15.25), #4 (05.16.25) - exam shows scattered MA, DBH and preretinal heme OU; OS with PRP 360 - FA (02.20.24) shows OD: fine NVD; scattered patches of vascular nonperfusion; enlarged FAZ; diffuse leaking MA; OS: Dense cluster of leaking MA's ST mac; staining of PRP laser 360; no active NV - OCT shows OD: Mild cystic changes nasal mac, diffuse IRA, patchy ORA; OS: Persistent central cystic changes -- slightly improved at 4+ weeks - recommend IVE OU #5 today, 06.20.25 for DME w/ f/u in 4-5 wks - pt wishes to proceed with injections - RBA of procedure discussed, questions answered - Eylea  informed consent obtained and signed OU 02.04.25 - see procedure note - f/u in 4-5 wks -- DFE/OCT, possible injection  6,7. Hypertensive retinopathy OU - discussed importance of tight BP control - continue to monitor  8. Pseudophakia OU  - s/p CE/IOL OU  - IOL in good position, doing well  - continue to monitor  9. Ocular Hypertension OD  - IOP today: 18,16  - on Cosopt BID OU and Latanoprost QHS OU per Dr. Candi Chafe  - started Brimonidine  bid OD only in March 2024  Ophthalmic Meds Ordered this visit:  No orders of the defined types were placed in this encounter.    No follow-ups on file.  There are no Patient Instructions on file for this visit.  This document serves as a record of services personally performed by Jeanice Millard, MD, PhD. It was created on their behalf by Angelia Kelp, an ophthalmic technician. The creation of this record is the provider's dictation and/or activities during  the visit.    Electronically signed by: Angelia Kelp, OA, 10/04/23  9:44 AM   Jeanice Millard, M.D., Ph.D. Diseases & Surgery of the Retina and Vitreous [facility]   Abbreviations: M myopia (nearsighted); A astigmatism; H hyperopia (farsighted); P presbyopia; Mrx spectacle prescription;  CTL contact lenses; OD right eye; OS left eye; OU both eyes  XT exotropia; ET esotropia; PEK punctate epithelial keratitis; PEE punctate epithelial erosions; DES dry eye syndrome; MGD meibomian gland dysfunction; ATs artificial tears; PFAT's preservative free artificial tears; NSC nuclear sclerotic cataract; PSC posterior subcapsular cataract; ERM epi-retinal membrane; PVD posterior vitreous detachment; RD retinal detachment; DM diabetes mellitus; DR diabetic retinopathy; NPDR non-proliferative diabetic retinopathy; PDR proliferative diabetic retinopathy; CSME clinically significant macular edema; DME  diabetic macular edema; dbh dot blot hemorrhages; CWS cotton wool spot; POAG primary open angle glaucoma; C/D cup-to-disc ratio; HVF humphrey visual field; GVF goldmann visual field; OCT optical coherence tomography; IOP intraocular pressure; BRVO Branch retinal vein occlusion; CRVO central retinal vein occlusion; CRAO central retinal artery occlusion; BRAO branch retinal artery occlusion; RT retinal tear; SB scleral buckle; PPV pars plana vitrectomy; VH Vitreous hemorrhage; PRP panretinal laser photocoagulation; IVK intravitreal kenalog; VMT vitreomacular traction; MH Macular hole;  NVD neovascularization of the disc; NVE neovascularization elsewhere; AREDS age related eye disease study; ARMD age related macular degeneration; POAG primary open angle glaucoma; EBMD epithelial/anterior basement membrane dystrophy; ACIOL anterior chamber intraocular lens; IOL intraocular lens; PCIOL posterior chamber intraocular lens; Phaco/IOL phacoemulsification with intraocular lens placement; PRK photorefractive  keratectomy; LASIK laser assisted in situ keratomileusis; HTN hypertension; DM diabetes mellitus; COPD chronic obstructive pulmonary disease

## 2023-10-06 ENCOUNTER — Other Ambulatory Visit: Payer: Medicare Other

## 2023-10-13 ENCOUNTER — Encounter (INDEPENDENT_AMBULATORY_CARE_PROVIDER_SITE_OTHER): Admitting: Ophthalmology

## 2023-10-13 DIAGNOSIS — I10 Essential (primary) hypertension: Secondary | ICD-10-CM

## 2023-10-13 DIAGNOSIS — H35033 Hypertensive retinopathy, bilateral: Secondary | ICD-10-CM

## 2023-10-13 DIAGNOSIS — Z961 Presence of intraocular lens: Secondary | ICD-10-CM

## 2023-10-13 DIAGNOSIS — Z794 Long term (current) use of insulin: Secondary | ICD-10-CM

## 2023-10-13 DIAGNOSIS — H34811 Central retinal vein occlusion, right eye, with macular edema: Secondary | ICD-10-CM

## 2023-10-13 DIAGNOSIS — E113513 Type 2 diabetes mellitus with proliferative diabetic retinopathy with macular edema, bilateral: Secondary | ICD-10-CM

## 2023-10-13 DIAGNOSIS — Z9889 Other specified postprocedural states: Secondary | ICD-10-CM

## 2023-10-13 DIAGNOSIS — H40051 Ocular hypertension, right eye: Secondary | ICD-10-CM

## 2023-10-13 DIAGNOSIS — Z7984 Long term (current) use of oral hypoglycemic drugs: Secondary | ICD-10-CM

## 2023-10-17 ENCOUNTER — Encounter (INDEPENDENT_AMBULATORY_CARE_PROVIDER_SITE_OTHER): Admitting: Ophthalmology

## 2023-10-19 NOTE — Progress Notes (Signed)
 [facility] - Clinic Note  10/23/2023     CHIEF COMPLAINT Patient presents for Retina Follow Up   HISTORY OF PRESENT ILLNESS: Ann Gomez is a 73 y.o. female who presents to the clinic today for:   HPI     Retina Follow Up   Patient presents with  Diabetic Retinopathy.  In both eyes.  This started 5 weeks ago.  Duration of 5 weeks.  Since onset it is stable.  I, the attending physician,  performed the HPI with the patient and updated documentation appropriately.        Comments   5 week retina follow up PDR OU and I'VE OU pt is reporting no vision changes noticed she denies any flashes some floaters her last reading 142 she is using Cosopt BID OU and Latanoprost QHS OU Brimonidine  bid OD only        Last edited by Valdemar Rogue, MD on 10/23/2023 11:26 AM.    Patient states the vision is stable  Referring physician: Earvin Johnston PARAS, FNP [address] [place],  [place] [address]  HISTORICAL INFORMATION:   Selected notes from the MEDICAL RECORD NUMBER Dr. Elner pt here due to insurance reasons LEE:  Ocular Hx- PMH-    CURRENT MEDICATIONS: Current Outpatient Medications (Ophthalmic Drugs)  Medication Sig   brimonidine  (ALPHAGAN ) 0.2 % ophthalmic solution Place 1 drop into the right eye 2 (two) times daily.   dorzolamide-timolol (COSOPT) 22.3-6.8 MG/ML ophthalmic solution Place 1 drop into both eyes 2 (two) times daily.   latanoprost (XALATAN) 0.005 % ophthalmic solution Place 1 drop into both eyes at bedtime.   Latanoprostene Bunod (VYZULTA) 0.024 % SOLN Place 1 drop into both eyes at bedtime. QHS OU   No current facility-administered medications for this visit. (Ophthalmic Drugs)   Current Outpatient Medications (Other)  Medication Sig   aspirin  EC 81 MG tablet Take 1 tablet (81 mg total) by mouth daily.   carvedilol  (COREG ) 12.5 MG tablet Take 1 tablet (12.5 mg total) by mouth 2 (two) times daily with a meal.   Cholecalciferol (VITAMIN D3  PO) Take 1 capsule by mouth daily.   empagliflozin (JARDIANCE) 10 MG TABS tablet Take 1 tablet by mouth daily.   furosemide  (LASIX ) 20 MG tablet Take 20 mg by mouth daily.   Ginger, Zingiber officinalis, (GINGER ROOT PO) Take 550 mg by mouth daily.   glipiZIDE (GLUCOTROL) 10 MG tablet Take 10 mg by mouth in the morning and at bedtime.   hydrALAZINE  (APRESOLINE ) 100 MG tablet Take 1 tablet by mouth 3 (three) times daily.   LEVEMIR FLEXTOUCH 100 UNIT/ML FlexTouch Pen Inject 17 Units into the skin 2 (two) times daily.   mupirocin  ointment (BACTROBAN ) 2 % APPLY 1 APPLICATION TOPICALLY DAILY   olmesartan  (BENICAR ) 40 MG tablet Take 1 tablet (40 mg total) by mouth daily.   No current facility-administered medications for this visit. (Other)   REVIEW OF SYSTEMS: ROS   Positive for: Endocrine, Cardiovascular, Eyes Negative for: Constitutional, Gastrointestinal, Neurological, Skin, Genitourinary, Musculoskeletal, HENT, Respiratory, Psychiatric, Allergic/Imm, Heme/Lymph Last edited by Resa Delon ORN, COT on 10/23/2023  8:30 AM.      ALLERGIES Allergies  Allergen Reactions   Hydrochlorothiazide Other (See Comments)    Dizziness   PAST MEDICAL HISTORY Past Medical History:  Diagnosis Date   Diabetes mellitus    Edema    Essential hypertension, benign    Hyperlipidemia    Obesity, unspecified    Past Surgical History:  Procedure Laterality Date   AMPUTATION Right 11/20/2021   Procedure: AMPUTATION GREAT TOE;  Surgeon: Silva Juliene SAUNDERS, DPM;  Location: [facility] OR;  Service: Podiatry;  Laterality: Right;   CATARACT EXTRACTION     CESAREAN SECTION     EYE SURGERY     FAMILY HISTORY Family History  Family history unknown: Yes   SOCIAL HISTORY Social History   Tobacco Use   Smoking status: Never   Smokeless tobacco: Never  Vaping Use   Vaping status: Never Used  Substance Use Topics   Alcohol use: Yes    Comment: occasional   Drug use: No       OPHTHALMIC EXAM:  Base  Eye Exam     Visual Acuity (Snellen - Linear)       Right Left   Dist [place] 20/250 20/25 -3   Dist ph [place] NI NI         Tonometry (Tonopen, 8:34 AM)       Right Left   Pressure 22 18         Pupils       Pupils Dark Light Shape React APD   Right PERRL 3 2 Round Brisk None   Left PERRL 3 2 Round Brisk None         Visual Fields       Left Right    Full Full         Extraocular Movement       Right Left    Full, Ortho Full, Ortho         Neuro/Psych     Oriented x3: Yes   Mood/Affect: Normal           Slit Lamp and Fundus Exam     Slit Lamp Exam       Right Left   Lids/Lashes Dermatochalasis - upper lid Dermatochalasis - upper lid   Conjunctiva/Sclera Melanosis Melanosis   Cornea 3+ Punctate epithelial erosions, well healed cataract wound arcus, trace PEE, mild endo pigment, well healed cataract wound   Anterior Chamber deep, clear, narrow angles deep, clear, narrow angles   Iris Round and dilated Round and dilated   Lens PC IOL in good position with open PC PC IOL in good position   Anterior Vitreous mild syneresis post vitrectomy         Fundus Exam       Right Left   Disc 2-3+Pallor, Sharp rim 2+ Pallor, Sharp rim   C/D Ratio 0.4 0.5   Macula Flat, Blunted foveal reflex, scattered IRH/DBH and exudates, focal cystic changes nasal macula, diffuse atrophy Flat, Blunted foveal reflex, scattered MA/DBH greatest temporally, central cystic changes / edema -- slightly improved, dense laser temporal macula   Vessels attenuated, Tortuous attenuated, Tortuous   Periphery Attached, scattered MA/DBH and exudates Attached, scattered MA / DBH, scattered patches of PRP with room for fill in           IMAGING AND PROCEDURES  Imaging and Procedures for 10/23/2023  OCT, Retina - OU - Both Eyes       Right Eye Quality was good. Scan locations included subfoveal. Central Foveal Thickness: 204. Progression has been stable. Findings include normal  foveal contour, no SRF, intraretinal hyper-reflective material, intraretinal fluid, inner retinal atrophy, vitreomacular adhesion (Mild cystic changes nasal mac -- persistent, diffuse IRA, patchy ORA ).   Left Eye Quality was good. Scan locations included subfoveal. Central Foveal Thickness: 322. Progression has improved. Findings include no SRF, abnormal foveal  contour, subretinal hyper-reflective material, epiretinal membrane, intraretinal fluid, subretinal fluid (Persistent central cystic changes -- slightly improved).   Notes *Images captured and stored on drive  Diagnosis / Impression:  OD: CRVO -- Mild cystic changes nasal mac -- persistent, diffuse IRA, patchy ORA   OS: +DME -- Persistent central cystic changes -- slightly improved  Clinical management:  See below  Abbreviations: NFP - Normal foveal profile. CME - cystoid macular edema. PED - pigment epithelial detachment. IRF - intraretinal fluid. SRF - subretinal fluid. EZ - ellipsoid zone. ERM - epiretinal membrane. ORA - outer retinal atrophy. ORT - outer retinal tubulation. SRHM - subretinal hyper-reflective material. IRHM - intraretinal hyper-reflective material      Intravitreal Injection, Pharmacologic Agent - OD - Right Eye       Time Out 10/23/2023. 9:23 AM. Confirmed correct patient, procedure, site, and patient consented.   Anesthesia Topical anesthesia was used. Anesthetic medications included Lidocaine  2%, Proparacaine 0.5%.   Procedure Preparation included 5% betadine to ocular surface, eyelid speculum. A (32g) needle was used.   Injection: 2 mg aflibercept  2 MG/0.05ML   Route: Intravitreal, Site: Right Eye   NDC: D2246706, Lot: [id number], Expiration date: 02/22/2025, Waste: 0 mL   Post-op Post injection exam found visual acuity of at least counting fingers. The patient tolerated the procedure well. There were no complications. The patient received written and verbal post procedure care education. Post  injection medications were not given.      Intravitreal Injection, Pharmacologic Agent - OS - Left Eye       Time Out 10/23/2023. 9:23 AM. Confirmed correct patient, procedure, site, and patient consented.   Anesthesia Topical anesthesia was used. Anesthetic medications included Lidocaine  2%, Proparacaine 0.5%.   Procedure Preparation included 5% betadine to ocular surface, eyelid speculum. A (32g) needle was used.   Injection: 2 mg aflibercept  2 MG/0.05ML   Route: Intravitreal, Site: Left Eye   NDC: D2246706, Lot: [id number], Expiration date: 11/22/2024, Waste: 0 mL   Post-op Post injection exam found visual acuity of at least counting fingers. The patient tolerated the procedure well. There were no complications. The patient received written and verbal post procedure care education.            ASSESSMENT/PLAN:    ICD-10-CM   1. Central retinal vein occlusion with macular edema of right eye  H34.8110 OCT, Retina - OU - Both Eyes    Intravitreal Injection, Pharmacologic Agent - OD - Right Eye    aflibercept  (EYLEA ) SOLN 2 mg    2. Proliferative diabetic retinopathy of both eyes with macular edema associated with type 2 diabetes mellitus (HCC)  Z88.6486 Intravitreal Injection, Pharmacologic Agent - OS - Left Eye    aflibercept  (EYLEA ) SOLN 2 mg    3. Long term (current) use of oral hypoglycemic drugs  Z79.84     4. Encounter for long-term (current) use of insulin  (HCC)  Z79.4     5. History of vitrectomy  Z98.890     6. Essential hypertension  I10     7. Hypertensive retinopathy of both eyes  H35.033     8. Pseudophakia, both eyes  Z96.1     9. Ocular hypertension of right eye  H40.051      1. CRVO w/ CME, OD  - delayed f/u -- 6+ wks instead of 4-5 (05.16.25 to 06.30.25) due to weather/heat  - delayed f/u -- 6.9 wks instead of 4 (08.13.24 to 09.30.24 -- transportation issues)  - delayed f/u -- 3.5  mos instead of 4 wks due to insurance issues (3.19.24 to  07.08.24)  - history of multiple IVA OD w/ [facility] (last 06.06.23) - s/p IVA OD #1 (02.20.24), #2 (03.19.24) #3 (07.08.24), #4 (08.13.24), #5 (09.30.24), #6 (10.30.24) -- IVA resistance - s/p IVE OD #1 (02.04.25), #2 (03.11.25), #3 (04.15.25), #4 (05.16.25)  - BCVA OD 20/250 from 20/200 - OCT shows  OD: Mild cystic changes nasal mac -- persistent, diffuse IRA, patchy ORA at 6+ weeks - recommend IVE OD #5 -- see below  - RBA of procedure discussed, questions answered  - IVE informed consent obtained and signed, 02.04.25 (OU) - see procedure note - f/u 4-5 wks -- DFE/OCT, possible injection  2-5. Proliferative diabetic retinopathy, both eyes  - A1C 9.2 on 04.08.25  - history of PPV w/ membrane peel and laser OS for PDR ([facility] - 06.28.23) - s/p IVA OU #1 (02.20.24), #2 (03.19.24), #3 (07.08.24), #4 (08.13.24), #5 (09.30.24), #6 (10.30.24), #7 (12.06.24), #8 (01.07.25) -- IVA resistance - s/p IVE OU #1 (02.04.25), #2 (03.11.25), #3 (04.15.25), #4 (05.16.25) - exam shows scattered MA, DBH and preretinal heme OU; OS with PRP 360 - FA (02.20.24) shows OD: fine NVD; scattered patches of vascular nonperfusion; enlarged FAZ; diffuse leaking MA; OS: Dense cluster of leaking MA's ST mac; staining of PRP laser 360; no active NV - OCT shows OD: Mild cystic changes nasal mac -- persistent, diffuse IRA, patchy ORA; OS: Persistent central cystic changes -- slightly improved at 6+ weeks - recommend IVE OU #5 today, 06.20.25 for DME w/ f/u back to 4-5 wks - pt wishes to proceed with injections - RBA of procedure discussed, questions answered - Eylea  informed consent obtained and signed OU 02.04.25 - see procedure note - f/u in 4-5 wks -- DFE/OCT, possible injection  6,7. Hypertensive retinopathy OU - discussed importance of tight BP control - continue to monitor  8. Pseudophakia OU  - s/p CE/IOL OU  - IOL in good position, doing well  - continue to monitor  9. Ocular Hypertension OD  - IOP today:  22,18  - on Cosopt BID OU and Latanoprost QHS OU per Dr. Octavia  - started Brimonidine  bid OD only in March 2024  Ophthalmic Meds Ordered this visit:  Meds ordered this encounter  Medications   aflibercept  (EYLEA ) SOLN 2 mg   aflibercept  (EYLEA ) SOLN 2 mg     Return f/(u 4-5 weeks, CRVO OD / PDR OU, for DFE, OCT.  There are no Patient Instructions on file for this visit.  This document serves as a record of services personally performed by Redell JUDITHANN Hans, MD, PhD. It was created on their behalf by Almetta Pesa, an ophthalmic technician. The creation of this record is the provider's dictation and/or activities during the visit.    Electronically signed by: Almetta Pesa, OA, 10/23/23  11:27 AM  This document serves as a record of services personally performed by Redell JUDITHANN Hans, MD, PhD. It was created on their behalf by Alan PARAS. Delores, OA an ophthalmic technician. The creation of this record is the provider's dictation and/or activities during the visit.    Electronically signed by: Alan PARAS. Delores, OA 10/23/23 11:27 AM  Redell JUDITHANN Hans, M.D., Ph.D. Diseases & Surgery of the Retina and Vitreous [facility]  I have reviewed the above documentation for accuracy and completeness, and I agree with the above. Redell JUDITHANN Hans, M.D., Ph.D. 10/23/23 11:29 AM   Abbreviations: M myopia (nearsighted); A astigmatism;  H hyperopia (farsighted); P presbyopia; Mrx spectacle prescription;  CTL contact lenses; OD right eye; OS left eye; OU both eyes  XT exotropia; ET esotropia; PEK punctate epithelial keratitis; PEE punctate epithelial erosions; DES dry eye syndrome; MGD meibomian gland dysfunction; ATs artificial tears; PFAT's preservative free artificial tears; NSC nuclear sclerotic cataract; PSC posterior subcapsular cataract; ERM epi-retinal membrane; PVD posterior vitreous detachment; RD retinal detachment; DM diabetes mellitus; DR diabetic retinopathy; NPDR  non-proliferative diabetic retinopathy; PDR proliferative diabetic retinopathy; CSME clinically significant macular edema; DME diabetic macular edema; dbh dot blot hemorrhages; CWS cotton wool spot; POAG primary open angle glaucoma; C/D cup-to-disc ratio; HVF humphrey visual field; GVF goldmann visual field; OCT optical coherence tomography; IOP intraocular pressure; BRVO Branch retinal vein occlusion; CRVO central retinal vein occlusion; CRAO central retinal artery occlusion; BRAO branch retinal artery occlusion; RT retinal tear; SB scleral buckle; PPV pars plana vitrectomy; VH Vitreous hemorrhage; PRP panretinal laser photocoagulation; IVK intravitreal kenalog; VMT vitreomacular traction; MH Macular hole;  NVD neovascularization of the disc; NVE neovascularization elsewhere; AREDS age related eye disease study; ARMD age related macular degeneration; POAG primary open angle glaucoma; EBMD epithelial/anterior basement membrane dystrophy; ACIOL anterior chamber intraocular lens; IOL intraocular lens; PCIOL posterior chamber intraocular lens; Phaco/IOL phacoemulsification with intraocular lens placement; PRK photorefractive keratectomy; LASIK laser assisted in situ keratomileusis; HTN hypertension; DM diabetes mellitus; COPD chronic obstructive pulmonary disease

## 2023-10-23 ENCOUNTER — Ambulatory Visit (INDEPENDENT_AMBULATORY_CARE_PROVIDER_SITE_OTHER): Admitting: Ophthalmology

## 2023-10-23 ENCOUNTER — Encounter (INDEPENDENT_AMBULATORY_CARE_PROVIDER_SITE_OTHER): Payer: Self-pay | Admitting: Ophthalmology

## 2023-10-23 DIAGNOSIS — Z961 Presence of intraocular lens: Secondary | ICD-10-CM

## 2023-10-23 DIAGNOSIS — H34811 Central retinal vein occlusion, right eye, with macular edema: Secondary | ICD-10-CM | POA: Diagnosis not present

## 2023-10-23 DIAGNOSIS — E113513 Type 2 diabetes mellitus with proliferative diabetic retinopathy with macular edema, bilateral: Secondary | ICD-10-CM

## 2023-10-23 DIAGNOSIS — Z9889 Other specified postprocedural states: Secondary | ICD-10-CM

## 2023-10-23 DIAGNOSIS — I10 Essential (primary) hypertension: Secondary | ICD-10-CM

## 2023-10-23 DIAGNOSIS — Z794 Long term (current) use of insulin: Secondary | ICD-10-CM | POA: Diagnosis not present

## 2023-10-23 DIAGNOSIS — H35033 Hypertensive retinopathy, bilateral: Secondary | ICD-10-CM

## 2023-10-23 DIAGNOSIS — H40051 Ocular hypertension, right eye: Secondary | ICD-10-CM

## 2023-10-23 DIAGNOSIS — Z7984 Long term (current) use of oral hypoglycemic drugs: Secondary | ICD-10-CM | POA: Diagnosis not present

## 2023-10-23 MED ORDER — AFLIBERCEPT 2MG/0.05ML IZ SOLN FOR KALEIDOSCOPE
2.0000 mg | INTRAVITREAL | Status: AC | PRN
  Administered 2023-10-23: 2 mg via INTRAVITREAL

## 2023-10-30 ENCOUNTER — Ambulatory Visit: Attending: Physician Assistant | Admitting: Physician Assistant

## 2023-11-01 NOTE — Progress Notes (Signed)
 This encounter was created in error - please disregard.

## 2023-11-06 NOTE — Progress Notes (Signed)
 [facility] - Clinic Note  11/20/2023     CHIEF COMPLAINT Patient presents for Retina Follow Up   HISTORY OF PRESENT ILLNESS: Ann Gomez is a 73 y.o. female who presents to the clinic today for:   HPI     Retina Follow Up   Patient presents with  CRVO/BRVO.  In right eye.  This started 4 weeks ago.  I, the attending physician,  performed the HPI with the patient and updated documentation appropriately.        Comments   Patient here for 4 weeks retina follow up for CRVO OD/PDR OU. Patient states vision doing good. No eye pain. Didn't use drops this am. Usually uses drops.      Last edited by Valdemar Rogue, MD on 11/20/2023 12:19 PM.    Patient states the vision is stable. She reports using the dark blue gtt BID OU and purple top once. The green one at bedtime.   Referring physician: Earvin Johnston PARAS, FNP [address] [place],  [place] [address]  HISTORICAL INFORMATION:   Selected notes from the MEDICAL RECORD NUMBER Dr. Elner pt here due to insurance reasons LEE:  Ocular Hx- PMH-    CURRENT MEDICATIONS: Current Outpatient Medications (Ophthalmic Drugs)  Medication Sig   brimonidine  (ALPHAGAN ) 0.2 % ophthalmic solution Place 1 drop into the right eye 2 (two) times daily.   dorzolamide-timolol (COSOPT) 22.3-6.8 MG/ML ophthalmic solution Place 1 drop into both eyes 2 (two) times daily.   latanoprost (XALATAN) 0.005 % ophthalmic solution Place 1 drop into both eyes at bedtime.   Latanoprostene Bunod (VYZULTA) 0.024 % SOLN Place 1 drop into both eyes at bedtime. QHS OU   No current facility-administered medications for this visit. (Ophthalmic Drugs)   Current Outpatient Medications (Other)  Medication Sig   aspirin  EC 81 MG tablet Take 1 tablet (81 mg total) by mouth daily.   carvedilol  (COREG ) 12.5 MG tablet Take 1 tablet (12.5 mg total) by mouth 2 (two) times daily with a meal.   Cholecalciferol (VITAMIN D3 PO) Take 1 capsule by mouth  daily.   empagliflozin (JARDIANCE) 10 MG TABS tablet Take 1 tablet by mouth daily.   furosemide  (LASIX ) 20 MG tablet Take 20 mg by mouth daily.   Ginger, Zingiber officinalis, (GINGER ROOT PO) Take 550 mg by mouth daily.   glipiZIDE (GLUCOTROL) 10 MG tablet Take 10 mg by mouth in the morning and at bedtime.   hydrALAZINE  (APRESOLINE ) 100 MG tablet Take 1 tablet by mouth 3 (three) times daily.   LEVEMIR FLEXTOUCH 100 UNIT/ML FlexTouch Pen Inject 17 Units into the skin 2 (two) times daily.   mupirocin  ointment (BACTROBAN ) 2 % APPLY 1 APPLICATION TOPICALLY DAILY   olmesartan  (BENICAR ) 40 MG tablet Take 1 tablet (40 mg total) by mouth daily.   No current facility-administered medications for this visit. (Other)   REVIEW OF SYSTEMS: ROS   Positive for: Endocrine, Cardiovascular, Eyes Negative for: Constitutional, Gastrointestinal, Neurological, Skin, Genitourinary, Musculoskeletal, HENT, Respiratory, Psychiatric, Allergic/Imm, Heme/Lymph Last edited by Orval Asberry GORMAN, COA on 11/20/2023  9:13 AM.      ALLERGIES Allergies  Allergen Reactions   Hydrochlorothiazide Other (See Comments)    Dizziness   PAST MEDICAL HISTORY Past Medical History:  Diagnosis Date   Diabetes mellitus    Edema    Essential hypertension, benign    Hyperlipidemia    Obesity, unspecified    Past Surgical History:  Procedure Laterality Date   AMPUTATION Right  11/20/2021   Procedure: AMPUTATION GREAT TOE;  Surgeon: Silva Juliene SAUNDERS, DPM;  Location: [facility] OR;  Service: Podiatry;  Laterality: Right;   CATARACT EXTRACTION     CESAREAN SECTION     EYE SURGERY     FAMILY HISTORY Family History  Family history unknown: Yes   SOCIAL HISTORY Social History   Tobacco Use   Smoking status: Never   Smokeless tobacco: Never  Vaping Use   Vaping status: Never Used  Substance Use Topics   Alcohol use: Yes    Comment: occasional   Drug use: No       OPHTHALMIC EXAM:  Base Eye Exam     Visual Acuity  (Snellen - Linear)       Right Left   Dist [place] 20/250 20/25         Tonometry (Tonopen, 9:06 AM)       Right Left   Pressure 29,30 20  Brimonidine  and Cosopt given at 9:10 am        Pupils       Dark Light Shape React APD   Right 3 2 Round Brisk None   Left 3 2 Round Brisk None         Visual Fields (Counting fingers)       Left Right    Full Full         Extraocular Movement       Right Left    Full, Ortho Full, Ortho         Neuro/Psych     Oriented x3: Yes   Mood/Affect: Normal         Dilation     Both eyes: 1.0% Mydriacyl, 2.5% Phenylephrine @ 9:11 AM           Slit Lamp and Fundus Exam     Slit Lamp Exam       Right Left   Lids/Lashes Dermatochalasis - upper lid Dermatochalasis - upper lid   Conjunctiva/Sclera Melanosis Melanosis   Cornea 3+ Punctate epithelial erosions, well healed cataract wound arcus, trace PEE, mild endo pigment, well healed cataract wound   Anterior Chamber deep, clear, narrow angles deep, clear, narrow angles   Iris Round and dilated Round and dilated   Lens PC IOL in good position with open PC PC IOL in good position   Anterior Vitreous mild syneresis post vitrectomy         Fundus Exam       Right Left   Disc 2-3+Pallor, Sharp rim 2+ Pallor, Sharp rim   C/D Ratio 0.4 0.5   Macula Flat, Blunted foveal reflex, scattered IRH/DBH and exudates, focal cystic changes nasal macula, diffuse atrophy Flat, Blunted foveal reflex, scattered MA/DBH greatest temporally, central cystic changes / edema, dense laser temporal macula   Vessels attenuated, Tortuous attenuated, Tortuous   Periphery Attached, scattered MA/DBH and exudates Attached, scattered MA / DBH, scattered patches of PRP with room for fill in           IMAGING AND PROCEDURES  Imaging and Procedures for 11/20/2023  OCT, Retina - OU - Both Eyes       Right Eye Quality was good. Scan locations included subfoveal. Central Foveal Thickness: 207.  Progression has been stable. Findings include normal foveal contour, no SRF, intraretinal hyper-reflective material, intraretinal fluid, inner retinal atrophy, vitreomacular adhesion (Mild cystic changes nasal mac -- persistent, diffuse IRA, patchy ORA ).   Left Eye Quality was good. Scan locations included subfoveal. Central Foveal Thickness:  328. Progression has been stable. Findings include no SRF, abnormal foveal contour, subretinal hyper-reflective material, epiretinal membrane, intraretinal fluid, subretinal fluid (Persistent central cystic changes ).   Notes *Images captured and stored on drive  Diagnosis / Impression:  OD: CRVO -- Mild cystic changes nasal mac -- persistent, diffuse IRA, patchy ORA   OS: +DME -- Persistent central cystic changes -- slightly improved  Clinical management:  See below  Abbreviations: NFP - Normal foveal profile. CME - cystoid macular edema. PED - pigment epithelial detachment. IRF - intraretinal fluid. SRF - subretinal fluid. EZ - ellipsoid zone. ERM - epiretinal membrane. ORA - outer retinal atrophy. ORT - outer retinal tubulation. SRHM - subretinal hyper-reflective material. IRHM - intraretinal hyper-reflective material      Intravitreal Injection, Pharmacologic Agent - OD - Right Eye       Time Out 11/20/2023. 10:28 AM. Confirmed correct patient, procedure, site, and patient consented.   Anesthesia Topical anesthesia was used. Anesthetic medications included Lidocaine  2%, Proparacaine 0.5%.   Procedure Preparation included 5% betadine to ocular surface, eyelid speculum. A (32g) needle was used.   Injection: 2 mg aflibercept  2 MG/0.05ML   Route: Intravitreal, Site: Right Eye   NDC: Q956576, Lot: [id number], Expiration date: 12/22/2024, Waste: 0 mL   Post-op Post injection exam found visual acuity of at least counting fingers. The patient tolerated the procedure well. There were no complications. The patient received written and verbal  post procedure care education. Post injection medications were not given.      Intravitreal Injection, Pharmacologic Agent - OS - Left Eye       Time Out 11/20/2023. 10:28 AM. Confirmed correct patient, procedure, site, and patient consented.   Anesthesia Topical anesthesia was used. Anesthetic medications included Lidocaine  2%, Proparacaine 0.5%.   Procedure Preparation included 5% betadine to ocular surface, eyelid speculum. A (32g) needle was used.   Injection: 2 mg aflibercept  2 MG/0.05ML   Route: Intravitreal, Site: Left Eye   NDC: Q956576, Lot: [id number], Expiration date: 01/22/2025, Waste: 0 mL   Post-op Post injection exam found visual acuity of at least counting fingers. The patient tolerated the procedure well. There were no complications. The patient received written and verbal post procedure care education.             ASSESSMENT/PLAN:    ICD-10-CM   1. Central retinal vein occlusion with macular edema of right eye  H34.8110 OCT, Retina - OU - Both Eyes    Intravitreal Injection, Pharmacologic Agent - OD - Right Eye    aflibercept  (EYLEA ) SOLN 2 mg    2. Proliferative diabetic retinopathy of both eyes with macular edema associated with type 2 diabetes mellitus (HCC)  Z88.6486 Intravitreal Injection, Pharmacologic Agent - OS - Left Eye    aflibercept  (EYLEA ) SOLN 2 mg    3. Long term (current) use of oral hypoglycemic drugs  Z79.84     4. Encounter for long-term (current) use of insulin  (HCC)  Z79.4     5. History of vitrectomy  Z98.890     6. Essential hypertension  I10     7. Hypertensive retinopathy of both eyes  H35.033     8. Pseudophakia, both eyes  Z96.1     9. Ocular hypertension of right eye  H40.051      1. CRVO w/ CME, OD  - delayed f/u -- 6.9 wks instead of 4 (08.13.24 to 09.30.24 -- transportation issues)  - delayed f/u -- 3.5 mos instead of 4 wks due  to insurance issues (3.19.24 to 07.08.24)  - history of multiple IVA OD w/ [facility]  (last 06.06.23) - s/p IVA OD #1 (02.20.24), #2 (03.19.24) #3 (07.08.24), #4 (08.13.24), #5 (09.30.24), #6 (10.30.24) -- IVA resistance - s/p IVE OD #1 (02.04.25), #2 (03.11.25), #3 (04.15.25),  #4 (05.16.25), #5 (06.30.25)  - BCVA OD 20/200 from 20/300 - OCT shows  OD: Mild cystic changes nasal mac--persistent, diffuse IRA, patchy ORA at 4 weeks - recommend IVE OD #6 -- see below  - RBA of procedure discussed, questions answered  - IVE informed consent obtained and signed, 02.04.25 (OU) - see procedure note - f/u 4 wks -- DFE/OCT, possible injection  2-5. Proliferative diabetic retinopathy, both eyes  - A1C 9.2 on 04.08.25  - history of PPV w/ membrane peel and laser OS for PDR ([facility] - 06.28.23) - s/p IVA OU #1 (02.20.24), #2 (03.19.24), #3 (07.08.24), #4 (08.13.24), #5 (09.30.24), #6 (10.30.24), #7 (12.06.24), #8 (01.07.25) -- IVA resistance - s/p IVE OU #1 (02.04.25), #2 (03.11.25), #3 (04.15.25),  #4 (05.16.25), #5 (06.30.25) - exam shows scattered MA, DBH and preretinal heme OU; OS with PRP 360 - FA (02.20.24) shows OD: fine NVD; scattered patches of vascular nonperfusion; enlarged FAZ; diffuse leaking MA; OS: Dense cluster of leaking MA's ST mac; staining of PRP laser 360; no active NV - OCT shows OD: Mild cystic changes nasal mac--persistent, diffuse IRA, patchy ORA; OS: Persistent central cystic changes--slightly improved at 4 weeks - recommend IVE OU #6 today, 07.28.25 for DME w/ f/u in 4 wks - pt wishes to proceed with injections - RBA of procedure discussed, questions answered - Eylea  informed consent obtained and signed OU 02.04.25 - see procedure note - f/u in 4wks -- DFE/OCT, possible injection  6,7. Hypertensive retinopathy OU - discussed importance of tight BP control - continue to monitor  8. Pseudophakia OU  - s/p CE/IOL OU  - IOL in good position, doing well  - continue to monitor  9. Ocular Hypertension OD  - IOP today: 30,20 -- didn't use drops this  morning  - on Cosopt BID OU and Latanoprost QHS OU per Dr. Octavia  - started Brimonidine  bid OD only in March 2024  - questionable compliance  Ophthalmic Meds Ordered this visit:  Meds ordered this encounter  Medications   aflibercept  (EYLEA ) SOLN 2 mg   aflibercept  (EYLEA ) SOLN 2 mg     Return in about 4 weeks (around 12/18/2023) for CRVO OD/ PDR OU, DFE, OCT, Possible Injxn.  There are no Patient Instructions on file for this visit.  This document serves as a record of services personally performed by Redell JUDITHANN Hans, MD, PhD. It was created on their behalf by Avelina Pereyra, COA an ophthalmic technician. The creation of this record is the provider's dictation and/or activities during the visit.   Electronically signed by: Avelina GORMAN Pereyra, COT  11/20/23  12:21 PM   This document serves as a record of services personally performed by Redell JUDITHANN Hans, MD, PhD. It was created on their behalf by Almetta Pesa, an ophthalmic technician. The creation of this record is the provider's dictation and/or activities during the visit.    Electronically signed by: Almetta Pesa, OA, 11/20/23  12:21 PM  Redell JUDITHANN Hans, M.D., Ph.D. Diseases & Surgery of the Retina and Vitreous [facility]  I have reviewed the above documentation for accuracy and completeness, and I agree with the above. Redell JUDITHANN Hans, M.D., Ph.D. 11/20/23 12:24 PM  Abbreviations: M myopia (nearsighted); A astigmatism; H hyperopia (farsighted); P presbyopia; Mrx spectacle prescription;  CTL contact lenses; OD right eye; OS left eye; OU both eyes  XT exotropia; ET esotropia; PEK punctate epithelial keratitis; PEE punctate epithelial erosions; DES dry eye syndrome; MGD meibomian gland dysfunction; ATs artificial tears; PFAT's preservative free artificial tears; NSC nuclear sclerotic cataract; PSC posterior subcapsular cataract; ERM epi-retinal membrane; PVD posterior vitreous detachment; RD retinal  detachment; DM diabetes mellitus; DR diabetic retinopathy; NPDR non-proliferative diabetic retinopathy; PDR proliferative diabetic retinopathy; CSME clinically significant macular edema; DME diabetic macular edema; dbh dot blot hemorrhages; CWS cotton wool spot; POAG primary open angle glaucoma; C/D cup-to-disc ratio; HVF humphrey visual field; GVF goldmann visual field; OCT optical coherence tomography; IOP intraocular pressure; BRVO Branch retinal vein occlusion; CRVO central retinal vein occlusion; CRAO central retinal artery occlusion; BRAO branch retinal artery occlusion; RT retinal tear; SB scleral buckle; PPV pars plana vitrectomy; VH Vitreous hemorrhage; PRP panretinal laser photocoagulation; IVK intravitreal kenalog; VMT vitreomacular traction; MH Macular hole;  NVD neovascularization of the disc; NVE neovascularization elsewhere; AREDS age related eye disease study; ARMD age related macular degeneration; POAG primary open angle glaucoma; EBMD epithelial/anterior basement membrane dystrophy; ACIOL anterior chamber intraocular lens; IOL intraocular lens; PCIOL posterior chamber intraocular lens; Phaco/IOL phacoemulsification with intraocular lens placement; PRK photorefractive keratectomy; LASIK laser assisted in situ keratomileusis; HTN hypertension; DM diabetes mellitus; COPD chronic obstructive pulmonary disease

## 2023-11-20 ENCOUNTER — Encounter (INDEPENDENT_AMBULATORY_CARE_PROVIDER_SITE_OTHER): Payer: Self-pay | Admitting: Ophthalmology

## 2023-11-20 ENCOUNTER — Ambulatory Visit (INDEPENDENT_AMBULATORY_CARE_PROVIDER_SITE_OTHER): Admitting: Ophthalmology

## 2023-11-20 DIAGNOSIS — E113513 Type 2 diabetes mellitus with proliferative diabetic retinopathy with macular edema, bilateral: Secondary | ICD-10-CM | POA: Diagnosis not present

## 2023-11-20 DIAGNOSIS — Z794 Long term (current) use of insulin: Secondary | ICD-10-CM

## 2023-11-20 DIAGNOSIS — Z7984 Long term (current) use of oral hypoglycemic drugs: Secondary | ICD-10-CM

## 2023-11-20 DIAGNOSIS — H34811 Central retinal vein occlusion, right eye, with macular edema: Secondary | ICD-10-CM

## 2023-11-20 DIAGNOSIS — Z961 Presence of intraocular lens: Secondary | ICD-10-CM

## 2023-11-20 DIAGNOSIS — I10 Essential (primary) hypertension: Secondary | ICD-10-CM

## 2023-11-20 DIAGNOSIS — Z9889 Other specified postprocedural states: Secondary | ICD-10-CM

## 2023-11-20 DIAGNOSIS — H35033 Hypertensive retinopathy, bilateral: Secondary | ICD-10-CM

## 2023-11-20 DIAGNOSIS — H40051 Ocular hypertension, right eye: Secondary | ICD-10-CM

## 2023-11-20 MED ORDER — AFLIBERCEPT 2MG/0.05ML IZ SOLN FOR KALEIDOSCOPE
2.0000 mg | INTRAVITREAL | Status: AC | PRN
  Administered 2023-11-20: 2 mg via INTRAVITREAL

## 2023-11-21 LAB — COLOGUARD: COLOGUARD: NEGATIVE

## 2023-11-27 ENCOUNTER — Other Ambulatory Visit (HOSPITAL_BASED_OUTPATIENT_CLINIC_OR_DEPARTMENT_OTHER)

## 2023-12-04 ENCOUNTER — Telehealth: Payer: Self-pay | Admitting: Nurse Practitioner

## 2023-12-04 ENCOUNTER — Other Ambulatory Visit: Payer: Self-pay

## 2023-12-04 DIAGNOSIS — I10 Essential (primary) hypertension: Secondary | ICD-10-CM

## 2023-12-04 MED ORDER — OLMESARTAN MEDOXOMIL 40 MG PO TABS: 40.0000 mg | ORAL_TABLET | Freq: Every day | ORAL | 2 refills | Status: AC

## 2023-12-04 NOTE — Telephone Encounter (Signed)
*  STAT* If patient is at the pharmacy, call can be transferred to refill team.   1. Which medications need to be refilled? (please list name of each medication and dose if known) olmesartan  (BENICAR ) 40 MG tablet     4. Which pharmacy/location (including street and city if local pharmacy) is medication to be sent to?  [facility] - [place],  - [address]     5. Do they need a 30 day or 90 day supply? 90

## 2023-12-04 NOTE — Progress Notes (Shared)
 [facility] - Clinic Note  12/18/2023     CHIEF COMPLAINT Patient presents for No chief complaint on file.   HISTORY OF PRESENT ILLNESS: Ann Gomez is a 73 y.o. female who presents to the clinic today for:    Patient states the vision is stable. She reports using the dark blue gtt BID OU and purple top once. The green one at bedtime.   Referring physician: Earvin Johnston PARAS, FNP [address] [place],  [place] [address]  HISTORICAL INFORMATION:   Selected notes from the MEDICAL RECORD NUMBER Dr. Elner pt here due to insurance reasons LEE:  Ocular Hx- PMH-    CURRENT MEDICATIONS: Current Outpatient Medications (Ophthalmic Drugs)  Medication Sig   brimonidine  (ALPHAGAN ) 0.2 % ophthalmic solution Place 1 drop into the right eye 2 (two) times daily.   dorzolamide-timolol (COSOPT) 22.3-6.8 MG/ML ophthalmic solution Place 1 drop into both eyes 2 (two) times daily.   latanoprost (XALATAN) 0.005 % ophthalmic solution Place 1 drop into both eyes at bedtime.   Latanoprostene Bunod (VYZULTA) 0.024 % SOLN Place 1 drop into both eyes at bedtime. QHS OU   No current facility-administered medications for this visit. (Ophthalmic Drugs)   Current Outpatient Medications (Other)  Medication Sig   aspirin  EC 81 MG tablet Take 1 tablet (81 mg total) by mouth daily.   carvedilol  (COREG ) 12.5 MG tablet Take 1 tablet (12.5 mg total) by mouth 2 (two) times daily with a meal.   Cholecalciferol (VITAMIN D3 PO) Take 1 capsule by mouth daily.   empagliflozin (JARDIANCE) 10 MG TABS tablet Take 1 tablet by mouth daily.   furosemide  (LASIX ) 20 MG tablet Take 20 mg by mouth daily.   Ginger, Zingiber officinalis, (GINGER ROOT PO) Take 550 mg by mouth daily.   glipiZIDE (GLUCOTROL) 10 MG tablet Take 10 mg by mouth in the morning and at bedtime.   hydrALAZINE  (APRESOLINE ) 100 MG tablet Take 1 tablet by mouth 3 (three) times daily.   LEVEMIR FLEXTOUCH 100 UNIT/ML FlexTouch Pen  Inject 17 Units into the skin 2 (two) times daily.   mupirocin  ointment (BACTROBAN ) 2 % APPLY 1 APPLICATION TOPICALLY DAILY   olmesartan  (BENICAR ) 40 MG tablet Take 1 tablet (40 mg total) by mouth daily.   No current facility-administered medications for this visit. (Other)   REVIEW OF SYSTEMS:    ALLERGIES Allergies  Allergen Reactions   Hydrochlorothiazide Other (See Comments)    Dizziness   PAST MEDICAL HISTORY Past Medical History:  Diagnosis Date   Diabetes mellitus    Edema    Essential hypertension, benign    Hyperlipidemia    Obesity, unspecified    Past Surgical History:  Procedure Laterality Date   AMPUTATION Right 11/20/2021   Procedure: AMPUTATION GREAT TOE;  Surgeon: Silva Juliene SAUNDERS, DPM;  Location: [facility] OR;  Service: Podiatry;  Laterality: Right;   CATARACT EXTRACTION     CESAREAN SECTION     EYE SURGERY     FAMILY HISTORY Family History  Family history unknown: Yes   SOCIAL HISTORY Social History   Tobacco Use   Smoking status: Never   Smokeless tobacco: Never  Vaping Use   Vaping status: Never Used  Substance Use Topics   Alcohol use: Yes    Comment: occasional   Drug use: No       OPHTHALMIC EXAM:  Not recorded    IMAGING AND PROCEDURES  Imaging and Procedures for 12/18/2023  ASSESSMENT/PLAN:    ICD-10-CM   1. Central retinal vein occlusion with macular edema of right eye  H34.8110     2. Proliferative diabetic retinopathy of both eyes with macular edema associated with type 2 diabetes mellitus (HCC)  Z88.6486     3. Long term (current) use of oral hypoglycemic drugs  Z79.84     4. Encounter for long-term (current) use of insulin  (HCC)  Z79.4     5. History of vitrectomy  Z98.890     6. Essential hypertension  I10     7. Hypertensive retinopathy of both eyes  H35.033     8. Pseudophakia, both eyes  Z96.1     9. Ocular hypertension of right eye  H40.051       1. CRVO w/ CME, OD  - delayed f/u -- 6.9 wks  instead of 4 (08.13.24 to 09.30.24 -- transportation issues)  - delayed f/u -- 3.5 mos instead of 4 wks due to insurance issues (3.19.24 to 07.08.24)  - history of multiple IVA OD w/ [facility] (last 06.06.23) - s/p IVA OD #1 (02.20.24), #2 (03.19.24) #3 (07.08.24), #4 (08.13.24), #5 (09.30.24), #6 (10.30.24) -- IVA resistance - s/p IVE OD #1 (02.04.25), #2 (03.11.25), #3 (04.15.25),  #4 (05.16.25), #5 (06.30.25), #6 (07.28.25)  - BCVA OD 20/200 from 20/300 - OCT shows  OD: Mild cystic changes nasal mac--persistent, diffuse IRA, patchy ORA at 4 weeks - recommend IVE OD -- see below  - RBA of procedure discussed, questions answered  - IVE informed consent obtained and signed, 02.04.25 (OU) - see procedure note - f/u 4 wks -- DFE/OCT, possible injection  2-5. Proliferative diabetic retinopathy, both eyes  - A1C 9.2 on 04.08.25  - history of PPV w/ membrane peel and laser OS for PDR ([facility] - 06.28.23) - s/p IVA OU #1 (02.20.24), #2 (03.19.24), #3 (07.08.24), #4 (08.13.24), #5 (09.30.24), #6 (10.30.24), #7 (12.06.24), #8 (01.07.25) -- IVA resistance - s/p IVE OU #1 (02.04.25), #2 (03.11.25), #3 (04.15.25),  #4 (05.16.25), #5 (06.30.25), #6 (07.28.25) - exam shows scattered MA, DBH and preretinal heme OU; OS with PRP 360 - FA (02.20.24) shows OD: fine NVD; scattered patches of vascular nonperfusion; enlarged FAZ; diffuse leaking MA; OS: Dense cluster of leaking MA's ST mac; staining of PRP laser 360; no active NV - OCT shows OD: Mild cystic changes nasal mac--persistent, diffuse IRA, patchy ORA; OS: Persistent central cystic changes--slightly improved at 4 weeks - recommend IVE today OU #7 (08.25.25) for DME w/ f/u in 4 wks - pt wishes to proceed with injections - RBA of procedure discussed, questions answered - Eylea  informed consent obtained and signed OU 02.04.25 - see procedure note - f/u in 4wks -- DFE/OCT, possible injection(s)  6,7. Hypertensive retinopathy OU - discussed importance of  tight BP control - continue to monitor  8. Pseudophakia OU  - s/p CE/IOL OU  - IOL in good position, doing well  - continue to monitor  9. Ocular Hypertension OD  - IOP today: 30,20 -- didn't use drops this morning  - on Cosopt BID OU and Latanoprost QHS OU per Dr. Octavia  - started Brimonidine  bid OD only in March 2024  - questionable compliance  Ophthalmic Meds Ordered this visit:  No orders of the defined types were placed in this encounter.    No follow-ups on file.  There are no Patient Instructions on file for this visit.  This document serves as a record of services personally performed by Redell JUDITHANN Hans,  MD, PhD. It was created on their behalf by Avelina Pereyra, COA an ophthalmic technician. The creation of this record is the provider's dictation and/or activities during the visit.   Electronically signed by: Avelina GORMAN Pereyra, COT  12/04/23  10:38 AM     Redell JUDITHANN Hans, M.D., Ph.D. Diseases & Surgery of the Retina and Vitreous [facility]    Abbreviations: M myopia (nearsighted); A astigmatism; H hyperopia (farsighted); P presbyopia; Mrx spectacle prescription;  CTL contact lenses; OD right eye; OS left eye; OU both eyes  XT exotropia; ET esotropia; PEK punctate epithelial keratitis; PEE punctate epithelial erosions; DES dry eye syndrome; MGD meibomian gland dysfunction; ATs artificial tears; PFAT's preservative free artificial tears; NSC nuclear sclerotic cataract; PSC posterior subcapsular cataract; ERM epi-retinal membrane; PVD posterior vitreous detachment; RD retinal detachment; DM diabetes mellitus; DR diabetic retinopathy; NPDR non-proliferative diabetic retinopathy; PDR proliferative diabetic retinopathy; CSME clinically significant macular edema; DME diabetic macular edema; dbh dot blot hemorrhages; CWS cotton wool spot; POAG primary open angle glaucoma; C/D cup-to-disc ratio; HVF humphrey visual field; GVF goldmann visual field; OCT optical  coherence tomography; IOP intraocular pressure; BRVO Branch retinal vein occlusion; CRVO central retinal vein occlusion; CRAO central retinal artery occlusion; BRAO branch retinal artery occlusion; RT retinal tear; SB scleral buckle; PPV pars plana vitrectomy; VH Vitreous hemorrhage; PRP panretinal laser photocoagulation; IVK intravitreal kenalog; VMT vitreomacular traction; MH Macular hole;  NVD neovascularization of the disc; NVE neovascularization elsewhere; AREDS age related eye disease study; ARMD age related macular degeneration; POAG primary open angle glaucoma; EBMD epithelial/anterior basement membrane dystrophy; ACIOL anterior chamber intraocular lens; IOL intraocular lens; PCIOL posterior chamber intraocular lens; Phaco/IOL phacoemulsification with intraocular lens placement; PRK photorefractive keratectomy; LASIK laser assisted in situ keratomileusis; HTN hypertension; DM diabetes mellitus; COPD chronic obstructive pulmonary disease

## 2023-12-13 ENCOUNTER — Ambulatory Visit: Admitting: Podiatry

## 2023-12-18 ENCOUNTER — Encounter (INDEPENDENT_AMBULATORY_CARE_PROVIDER_SITE_OTHER): Admitting: Ophthalmology

## 2023-12-18 DIAGNOSIS — Z9889 Other specified postprocedural states: Secondary | ICD-10-CM

## 2023-12-18 DIAGNOSIS — Z7984 Long term (current) use of oral hypoglycemic drugs: Secondary | ICD-10-CM

## 2023-12-18 DIAGNOSIS — E113513 Type 2 diabetes mellitus with proliferative diabetic retinopathy with macular edema, bilateral: Secondary | ICD-10-CM

## 2023-12-18 DIAGNOSIS — H35033 Hypertensive retinopathy, bilateral: Secondary | ICD-10-CM

## 2023-12-18 DIAGNOSIS — H34811 Central retinal vein occlusion, right eye, with macular edema: Secondary | ICD-10-CM

## 2023-12-18 DIAGNOSIS — Z961 Presence of intraocular lens: Secondary | ICD-10-CM

## 2023-12-18 DIAGNOSIS — H40051 Ocular hypertension, right eye: Secondary | ICD-10-CM

## 2023-12-18 DIAGNOSIS — Z794 Long term (current) use of insulin: Secondary | ICD-10-CM

## 2023-12-18 DIAGNOSIS — I10 Essential (primary) hypertension: Secondary | ICD-10-CM

## 2023-12-21 ENCOUNTER — Ambulatory Visit: Admitting: Podiatry

## 2023-12-21 NOTE — Progress Notes (Signed)
 [facility] - Clinic Note  01/02/2024     CHIEF COMPLAINT Patient presents for Retina Follow Up   HISTORY OF PRESENT ILLNESS: Ann Gomez is a 73 y.o. female who presents to the clinic today for:   HPI     Retina Follow Up   Patient presents with  CRVO/BRVO.  In right eye.  This started 4 weeks ago.  I, the attending physician,  performed the HPI with the patient and updated documentation appropriately.        Comments   Patient here for 4 weeks retina follow up for CRVO OD/PDR OU. Patient states vision the same. No eye pain. Dr Octavia stopped latanoprost and started Rocklatan QHS OU, still on Brimonidine  BID OU and Dorzolamide/Timolol BID OU.       Last edited by Okema Rollinson, MD on 01/02/2024 12:26 PM.     Patient states she has been under the weather and has to reschedule her last appt.   Referring physician: Earvin Johnston PARAS, FNP [address] [place],  [place] [address]  HISTORICAL INFORMATION:   Selected notes from the MEDICAL RECORD NUMBER Dr. Elner pt here due to insurance reasons LEE:  Ocular Hx- PMH-    CURRENT MEDICATIONS: Current Outpatient Medications (Ophthalmic Drugs)  Medication Sig   brimonidine  (ALPHAGAN ) 0.2 % ophthalmic solution Place 1 drop into the right eye 2 (two) times daily.   dorzolamide-timolol (COSOPT) 22.3-6.8 MG/ML ophthalmic solution Place 1 drop into both eyes 2 (two) times daily.   Netarsudil-Latanoprost (ROCKLATAN) 0.02-0.005 % SOLN Place 1 drop into both eyes at bedtime.   latanoprost (XALATAN) 0.005 % ophthalmic solution Place 1 drop into both eyes at bedtime. (Patient not taking: Reported on 01/02/2024)   Latanoprostene Bunod (VYZULTA) 0.024 % SOLN Place 1 drop into both eyes at bedtime. QHS OU (Patient not taking: Reported on 01/02/2024)   No current facility-administered medications for this visit. (Ophthalmic Drugs)   Current Outpatient Medications (Other)  Medication Sig   aspirin  EC 81 MG tablet Take 1  tablet (81 mg total) by mouth daily.   carvedilol  (COREG ) 12.5 MG tablet Take 1 tablet (12.5 mg total) by mouth 2 (two) times daily with a meal.   Cholecalciferol (VITAMIN D3 PO) Take 1 capsule by mouth daily.   empagliflozin (JARDIANCE) 10 MG TABS tablet Take 1 tablet by mouth daily.   furosemide  (LASIX ) 20 MG tablet Take 20 mg by mouth daily.   Ginger, Zingiber officinalis, (GINGER ROOT PO) Take 550 mg by mouth daily.   glipiZIDE (GLUCOTROL) 10 MG tablet Take 10 mg by mouth in the morning and at bedtime.   hydrALAZINE  (APRESOLINE ) 100 MG tablet Take 1 tablet by mouth 3 (three) times daily.   LEVEMIR FLEXTOUCH 100 UNIT/ML FlexTouch Pen Inject 17 Units into the skin 2 (two) times daily.   mupirocin  ointment (BACTROBAN ) 2 % APPLY 1 APPLICATION TOPICALLY DAILY   olmesartan  (BENICAR ) 40 MG tablet Take 1 tablet (40 mg total) by mouth daily.   No current facility-administered medications for this visit. (Other)   REVIEW OF SYSTEMS: ROS   Positive for: Endocrine, Cardiovascular, Eyes Negative for: Constitutional, Gastrointestinal, Neurological, Skin, Genitourinary, Musculoskeletal, HENT, Respiratory, Psychiatric, Allergic/Imm, Heme/Lymph Last edited by Orval Asberry GORMAN, COA on 01/02/2024  8:34 AM.       ALLERGIES Allergies  Allergen Reactions   Hydrochlorothiazide Other (See Comments)    Dizziness  Other Reaction(s): Dizziness   PAST MEDICAL HISTORY Past Medical History:  Diagnosis Date  Diabetes mellitus    Edema    Essential hypertension, benign    Hyperlipidemia    Obesity, unspecified    Past Surgical History:  Procedure Laterality Date   AMPUTATION Right 11/20/2021   Procedure: AMPUTATION GREAT TOE;  Surgeon: Silva Juliene SAUNDERS, DPM;  Location: [facility] OR;  Service: Podiatry;  Laterality: Right;   CATARACT EXTRACTION     CESAREAN SECTION     EYE SURGERY     FAMILY HISTORY Family History  Family history unknown: Yes   SOCIAL HISTORY Social History   Tobacco Use   Smoking  status: Never   Smokeless tobacco: Never  Vaping Use   Vaping status: Never Used  Substance Use Topics   Alcohol use: Yes    Comment: occasional   Drug use: No       OPHTHALMIC EXAM:  Base Eye Exam     Visual Acuity (Snellen - Linear)       Right Left   Dist [place] 20/250 -1 20/20         Tonometry (Tonopen, 8:31 AM)       Right Left   Pressure 16 19         Pupils       Dark Light Shape React APD   Right 3 2 Round Brisk None   Left 3 2 Round Brisk None         Visual Fields (Counting fingers)       Left Right    Full Full         Extraocular Movement       Right Left    Full, Ortho Full, Ortho         Neuro/Psych     Oriented x3: Yes   Mood/Affect: Normal         Dilation     Both eyes: 1.0% Mydriacyl, 2.5% Phenylephrine @ 8:30 AM           Slit Lamp and Fundus Exam     Slit Lamp Exam       Right Left   Lids/Lashes Dermatochalasis - upper lid Dermatochalasis - upper lid   Conjunctiva/Sclera Melanosis Melanosis   Cornea 3+ Punctate epithelial erosions, well healed cataract wound arcus, trace PEE, mild endo pigment, well healed cataract wound   Anterior Chamber deep, clear, narrow angles deep, clear, narrow angles   Iris Round and dilated Round and dilated   Lens PC IOL in good position with open PC PC IOL in good position   Anterior Vitreous mild syneresis post vitrectomy         Fundus Exam       Right Left   Disc 2-3+Pallor, Sharp rim 2+ Pallor, Sharp rim   C/D Ratio 0.4 0.5   Macula Flat, Blunted foveal reflex, scattered IRH/DBH and exudates, focal cystic changes nasal macula, diffuse atrophy Flat, Blunted foveal reflex, scattered MA/DBH greatest temporally, central cystic changes / edema, dense laser temporal macula   Vessels attenuated, Tortuous attenuated, Tortuous   Periphery Attached, scattered MA/DBH and exudates Attached, scattered MA / DBH, scattered patches of PRP with room for fill in           IMAGING AND  PROCEDURES  Imaging and Procedures for 01/02/2024  OCT, Retina - OU - Both Eyes       Right Eye Quality was good. Scan locations included subfoveal. Central Foveal Thickness: 202. Progression has been stable. Findings include normal foveal contour, no SRF, intraretinal hyper-reflective material, intraretinal fluid, inner retinal  atrophy, vitreomacular adhesion (Mild cystic changes nasal mac -- persistent, diffuse IRA, patchy ORA ).   Left Eye Quality was good. Scan locations included subfoveal. Central Foveal Thickness: 339. Progression has been stable. Findings include no SRF, abnormal foveal contour, subretinal hyper-reflective material, epiretinal membrane, intraretinal fluid, subretinal fluid (Persistent central cystic changes ).   Notes *Images captured and stored on drive  Diagnosis / Impression:  OD: CRVO -- Mild cystic changes nasal mac -- persistent, diffuse IRA, patchy ORA   OS: +DME -- Persistent central cystic changes  Clinical management:  See below  Abbreviations: NFP - Normal foveal profile. CME - cystoid macular edema. PED - pigment epithelial detachment. IRF - intraretinal fluid. SRF - subretinal fluid. EZ - ellipsoid zone. ERM - epiretinal membrane. ORA - outer retinal atrophy. ORT - outer retinal tubulation. SRHM - subretinal hyper-reflective material. IRHM - intraretinal hyper-reflective material      Intravitreal Injection, Pharmacologic Agent - OD - Right Eye       Time Out 01/02/2024. 9:34 AM. Confirmed correct patient, procedure, site, and patient consented.   Anesthesia Topical anesthesia was used. Anesthetic medications included Lidocaine  2%, Proparacaine 0.5%.   Procedure Preparation included 5% betadine to ocular surface, eyelid speculum. A (32g) needle was used.   Injection: 2 mg aflibercept  2 MG/0.05ML   Route: Intravitreal, Site: Right Eye   NDC: D2246706, Lot: [id number], Expiration date: 02/21/2025, Waste: 0 mL   Post-op Post injection  exam found visual acuity of at least counting fingers. The patient tolerated the procedure well. There were no complications. The patient received written and verbal post procedure care education. Post injection medications were not given.      Intravitreal Injection, Pharmacologic Agent - OS - Left Eye       Time Out 01/02/2024. 9:34 AM. Confirmed correct patient, procedure, site, and patient consented.   Anesthesia Topical anesthesia was used. Anesthetic medications included Lidocaine  2%, Proparacaine 0.5%.   Procedure Preparation included 5% betadine to ocular surface, eyelid speculum. A (32g) needle was used.   Injection: 2 mg aflibercept  2 MG/0.05ML   Route: Intravitreal, Site: Left Eye   NDC: D2246706, Lot: [id number], Expiration date: 02/21/2025, Waste: 0 mL   Post-op Post injection exam found visual acuity of at least counting fingers. The patient tolerated the procedure well. There were no complications. The patient received written and verbal post procedure care education.              ASSESSMENT/PLAN:    ICD-10-CM   1. Central retinal vein occlusion with macular edema of right eye  H34.8110 OCT, Retina - OU - Both Eyes    Intravitreal Injection, Pharmacologic Agent - OD - Right Eye    aflibercept  (EYLEA ) SOLN 2 mg    2. Proliferative diabetic retinopathy of both eyes with macular edema associated with type 2 diabetes mellitus (HCC)  E11.3513 OCT, Retina - OU - Both Eyes    Intravitreal Injection, Pharmacologic Agent - OD - Right Eye    Intravitreal Injection, Pharmacologic Agent - OS - Left Eye    aflibercept  (EYLEA ) SOLN 2 mg    aflibercept  (EYLEA ) SOLN 2 mg    3. Long term (current) use of oral hypoglycemic drugs  Z79.84     4. Encounter for long-term (current) use of insulin  (HCC)  Z79.4     5. History of vitrectomy  Z98.890     6. Essential hypertension  I10     7. Hypertensive retinopathy of both eyes  H35.033  8. Pseudophakia, both eyes   Z96.1     9. Ocular hypertension of right eye  H40.051      1. CRVO w/ CME, OD - delayed f/u -- 6.9 wks instead of 4 (08.13.24 to 09.30.24 -- transportation issues) - delayed f/u -- 3.5 mos instead of 4 wks due to insurance issues (3.19.24 to 07.08.24)  - history of multiple IVA OD w/ [facility] (last 06.06.23) - s/p IVA OD #1 (02.20.24), #2 (03.19.24) #3 (07.08.24), #4 (08.13.24), #5 (09.30.24), #6 (10.30.24) -- IVA resistance =================== - s/p IVE OD #1 (02.04.25), #2 (03.11.25), #3 (04.15.25),  #4 (05.16.25), #5 (06.30.25), #6 (07.28.25)  - BCVA OD 20/250 from 20/300 - OCT shows  OD: Mild cystic changes nasal mac--persistent, diffuse IRA, patchy ORA at 6 weeks - recommend IVE OD #7 -- see below  - RBA of procedure discussed, questions answered  - IVE informed consent obtained and signed, 02.04.25 (OU) - see procedure note - f/u 4-5 wks -- DFE/OCT, possible injection  2-5. Proliferative diabetic retinopathy, both eyes  - A1C 7.9 (07.08.25), 9.2 on 04.08.25 - history of PPV w/ membrane peel and laser OS for PDR ([facility] - 06.28.23) - s/p IVA OU #1 (02.20.24), #2 (03.19.24), #3 (07.08.24), #4 (08.13.24), #5 (09.30.24), #6 (10.30.24), #7 (12.06.24), #8 (01.07.25) -- IVA resistance ===================== - s/p IVE OU #1 (02.04.25), #2 (03.11.25), #3 (04.15.25),  #4 (05.16.25), #5 (06.30.25), #6 (07.28.25) - exam shows scattered MA, DBH and preretinal heme OU; OS with PRP 360 - FA (02.20.24) shows OD: fine NVD; scattered patches of vascular nonperfusion; enlarged FAZ; diffuse leaking MA; OS: Dense cluster of leaking MA's ST mac; staining of PRP laser 360; no active NV - OCT shows OD: Mild cystic changes nasal mac--persistent, diffuse IRA, patchy ORA; OS: Persistent central cystic changes--slightly improved at 6 weeks - recommend IVE today OU #7 (09.09.25) for DME w/ f/u in 4-5 wks - pt wishes to proceed with injections - RBA of procedure discussed, questions answered - Eylea  informed  consent obtained and signed OU 02.04.25 - see procedure note - f/u in 4-5 wks -- DFE/OCT, possible injection(s)  6,7. Hypertensive retinopathy OU - discussed importance of tight BP control - continue to monitor  8. Pseudophakia OU  - s/p CE/IOL OU  - IOL in good position, doing well  - continue to monitor  9. Ocular Hypertension OD  - IOP today: 16, 19  - on Cosopt BID OU and Rocklatan QHS OU per Dr. Octavia  - Brimonidine  OU BID  - questionable compliance  Ophthalmic Meds Ordered this visit:  Meds ordered this encounter  Medications   aflibercept  (EYLEA ) SOLN 2 mg   aflibercept  (EYLEA ) SOLN 2 mg     Return in about 5 weeks (around 02/06/2024) for f/u, PDR, DFE, OCT, Possible, IVE, OU.  There are no Patient Instructions on file for this visit.  This document serves as a record of services personally performed by Redell JUDITHANN Hans, MD, PhD. It was created on their behalf by Wanda GEANNIE Keens, COT an ophthalmic technician. The creation of this record is the provider's dictation and/or activities during the visit.    Electronically signed by:  Wanda GEANNIE Keens, COT  01/08/24 2:10 AM  Redell JUDITHANN Hans, M.D., Ph.D. Diseases & Surgery of the Retina and Vitreous [facility]  I have reviewed the above documentation for accuracy and completeness, and I agree with the above. Redell JUDITHANN Hans, M.D., Ph.D. 01/08/24 2:13 AM   Abbreviations: CHRISTELLA myopia (  nearsighted); A astigmatism; H hyperopia (farsighted); P presbyopia; Mrx spectacle prescription;  CTL contact lenses; OD right eye; OS left eye; OU both eyes  XT exotropia; ET esotropia; PEK punctate epithelial keratitis; PEE punctate epithelial erosions; DES dry eye syndrome; MGD meibomian gland dysfunction; ATs artificial tears; PFAT's preservative free artificial tears; NSC nuclear sclerotic cataract; PSC posterior subcapsular cataract; ERM epi-retinal membrane; PVD posterior vitreous detachment; RD retinal  detachment; DM diabetes mellitus; DR diabetic retinopathy; NPDR non-proliferative diabetic retinopathy; PDR proliferative diabetic retinopathy; CSME clinically significant macular edema; DME diabetic macular edema; dbh dot blot hemorrhages; CWS cotton wool spot; POAG primary open angle glaucoma; C/D cup-to-disc ratio; HVF humphrey visual field; GVF goldmann visual field; OCT optical coherence tomography; IOP intraocular pressure; BRVO Branch retinal vein occlusion; CRVO central retinal vein occlusion; CRAO central retinal artery occlusion; BRAO branch retinal artery occlusion; RT retinal tear; SB scleral buckle; PPV pars plana vitrectomy; VH Vitreous hemorrhage; PRP panretinal laser photocoagulation; IVK intravitreal kenalog; VMT vitreomacular traction; MH Macular hole;  NVD neovascularization of the disc; NVE neovascularization elsewhere; AREDS age related eye disease study; ARMD age related macular degeneration; POAG primary open angle glaucoma; EBMD epithelial/anterior basement membrane dystrophy; ACIOL anterior chamber intraocular lens; IOL intraocular lens; PCIOL posterior chamber intraocular lens; Phaco/IOL phacoemulsification with intraocular lens placement; PRK photorefractive keratectomy; LASIK laser assisted in situ keratomileusis; HTN hypertension; DM diabetes mellitus; COPD chronic obstructive pulmonary disease

## 2023-12-27 NOTE — Progress Notes (Deleted)
 Office Visit    Patient Name: Ann Gomez Date of Encounter: 12/27/2023  Primary Care Provider:  Earvin Johnston PARAS, FNP Primary Cardiologist:  Jerel Balding, MD  Chief Complaint    73 year old female with a history of mild mitral valve regurgitation, hypertension, hyperlipidemia, type 2 diabetes, s/p amputation of the right great toe, CKD stage III, and obesity who presents for follow-up related to hypertension.   Past Medical History    Past Medical History:  Diagnosis Date   Diabetes mellitus    Edema    Essential hypertension, benign    Hyperlipidemia    Obesity, unspecified    Past Surgical History:  Procedure Laterality Date   AMPUTATION Right 11/20/2021   Procedure: AMPUTATION GREAT TOE;  Surgeon: Silva Juliene SAUNDERS, DPM;  Location: [facility] OR;  Service: Podiatry;  Laterality: Right;   CATARACT EXTRACTION     CESAREAN SECTION     EYE SURGERY      Allergies  Allergies  Allergen Reactions   Hydrochlorothiazide Other (See Comments)    Dizziness  Other Reaction(s): Dizziness     Labs/Other Studies Reviewed    The following studies were reviewed today:  Cardiac Studies & Procedures   ______________________________________________________________________________________________     ECHOCARDIOGRAM  ECHOCARDIOGRAM COMPLETE 03/08/2022  Narrative ECHOCARDIOGRAM REPORT    Patient Name:   Ann Gomez Date of Exam: 03/08/2022 Medical Rec #:  [id number]      Height:       63.5 in Accession #:    [id number]     Weight:       211.0 lb Date of Birth:  04/03/1951      BSA:          1.990 m Patient Age:    71 years       BP:           154/84 mmHg Patient Gender: F              HR:           84 bpm. Exam Location:  [address]  Procedure: 2D Echo, Cardiac Doppler and Color Doppler  Indications:    R60.0 Bilateral Lower Extremity Edema.  History:        Patient has no prior history of Echocardiogram examinations. Signs/Symptoms:Edema; Risk Factors:Hypertension,  Diabetes and Dyslipidemia.  Sonographer:    Carl Rodgers-Jones RDCS Referring Phys: [id number] MIHAI CROITORU  IMPRESSIONS   1. Left ventricular ejection fraction, by estimation, is 60 to 65%. The left ventricle has normal function. The left ventricle has no regional wall motion abnormalities. Left ventricular diastolic parameters are consistent with Grade I diastolic dysfunction (impaired relaxation). 2. Right ventricular systolic function is normal. The right ventricular size is normal. There is mildly elevated pulmonary artery systolic pressure. 3. Left atrial size was mildly dilated. 4. Mild mitral valve regurgitation. 5. The aortic valve is tricuspid. Aortic valve regurgitation is not visualized. 6. The inferior vena cava is normal in size with greater than 50% respiratory variability, suggesting right atrial pressure of 3 mmHg.  Comparison(s): No prior Echocardiogram.  FINDINGS Left Ventricle: Left ventricular ejection fraction, by estimation, is 60 to 65%. The left ventricle has normal function. The left ventricle has no regional wall motion abnormalities. The left ventricular internal cavity size was normal in size. There is borderline left ventricular hypertrophy. Left ventricular diastolic parameters are consistent with Grade I diastolic dysfunction (impaired relaxation).  Right Ventricle: The right ventricular size is normal. Right ventricular systolic function is normal.  There is mildly elevated pulmonary artery systolic pressure. The tricuspid regurgitant velocity is 2.98 m/s, and with an assumed right atrial pressure of 3 mmHg, the estimated right ventricular systolic pressure is 38.5 mmHg.  Left Atrium: Left atrial size was mildly dilated.  Right Atrium: Right atrial size was normal in size.  Pericardium: There is no evidence of pericardial effusion.  Mitral Valve: There is mild thickening of the mitral valve leaflet(s). Mild mitral valve regurgitation.  Tricuspid  Valve: Tricuspid valve regurgitation is mild.  Aortic Valve: The aortic valve is tricuspid. Aortic valve regurgitation is not visualized.  Pulmonic Valve: Pulmonic valve regurgitation is not visualized.  Aorta: The aortic root and ascending aorta are structurally normal, with no evidence of dilitation.  Venous: The inferior vena cava is normal in size with greater than 50% respiratory variability, suggesting right atrial pressure of 3 mmHg.  IAS/Shunts: No atrial level shunt detected by color flow Doppler.   LEFT VENTRICLE PLAX 2D LVIDd:         4.40 cm   Diastology LVIDs:         3.10 cm   LV e' medial:    5.11 cm/s LV PW:         1.10 cm   LV E/e' medial:  19.5 LV IVS:        0.90 cm   LV e' lateral:   6.53 cm/s LVOT diam:     1.90 cm   LV E/e' lateral: 15.3 LV SV:         55 LV SV Index:   27 LVOT Area:     2.84 cm   RIGHT VENTRICLE             IVC RV Basal diam:  3.30 cm     IVC diam: 1.70 cm RV S prime:     16.50 cm/s TAPSE (M-mode): 2.9 cm  LEFT ATRIUM             Index        RIGHT ATRIUM           Index LA diam:        4.60 cm 2.31 cm/m   RA Area:     16.90 cm LA Vol (A2C):   68.0 ml 34.17 ml/m  RA Volume:   50.30 ml  25.28 ml/m LA Vol (A4C):   63.8 ml 32.06 ml/m LA Biplane Vol: 68.4 ml 34.38 ml/m AORTIC VALVE LVOT Vmax:   95.00 cm/s LVOT Vmean:  66.150 cm/s LVOT VTI:    0.192 m  AORTA Ao Root diam: 3.20 cm Ao Asc diam:  3.20 cm  MITRAL VALVE                TRICUSPID VALVE MV Area (PHT): 4.89 cm     TR Peak grad:   35.5 mmHg MV Decel Time: 155 msec     TR Vmax:        298.00 cm/s MV E velocity: 99.70 cm/s MV A velocity: 129.00 cm/s  SHUNTS MV E/A ratio:  0.77         Systemic VTI:  0.19 m Systemic Diam: 1.90 cm  Ronal Ross Electronically signed by Ronal Ross Signature Date/Time: 03/08/2022/12:08:41 PM    Final          ______________________________________________________________________________________________     Recent  Labs: 09/20/2023: BUN 34; Creatinine, Ser 1.76; Potassium 4.7; Sodium 136  Recent Lipid Panel    Component Value Date/Time   CHOL 148 07/08/2010 2320  TRIG 54 07/08/2010 2320   HDL 76 07/08/2010 2320   CHOLHDL 1.9 Ratio 07/08/2010 2320   VLDL 11 07/08/2010 2320   LDLCALC 61 07/08/2010 2320    History of Present Illness    73 year old female with the above past medical history including mild mitral valve regurgitation, hypertension, hyperlipidemia, type 2 diabetes, s/p amputation of the right great toe, CKD stage III, and obesity.   She has followed with cardiology (Dr. Francyne) for management of hypertension.  She has multiple drug allergies.  Echocardiogram in 2023 showed EF 60 to 65%, G1 DD, mildly elevated PASP, mild LAE, mild MR.  She was last seen in the office on 06/28/2022 and was stable from a cardiac standpoint.  She was started on amlodipine  5 mg daily.  She was advised to follow-up with advanced hypertension clinic however, this never occurred.  She was last seen in the office on 09/15/2023 and was stable from a cardiac standpoint.  She stopped taking her amlodipine  in the setting of increased lower extremity edema.  She was transitioned from losartan  to olmesartan .  Carvedilol  was increased to 12.5 mg twice daily.   She presents today for follow-up.  Since her last visit she has   1. Hypertension: Previously intolerant to HCTZ. She did not tolerate amlodipine  due to increased lower extremity edema.  BP remains elevated above goal.  Will stop losartan  and start olmesartan  40 mg daily.  Will increase carvedilol  to 12.5 mg twice daily.  If BP remains elevated above goal, consider referral to hypertension clinic Pharm.D.  Continue to monitor BP, HR, report SBP consistently<100 mmHg, HR consistently< 55 bpm. Will repeat BMET in 2 weeks.    2. Mitral valve regurgitation: Echocardiogram in 2023 showed EF 60 to 65%, G1 DD, mildly elevated PASP, mild LAE, mild MR. Spears increased bilateral  lower extremity edema in the setting of amlodipine  use.  This has improved with discontinuation of amlodipine .  She does have nonpitting, largely dependent, bilateral lower extremity edema, weight has been stable.  If lower extremity edema persists or worsens, consider increasing Lasix , will defer for now. Consider repeat echo as clinically indicated.    3. Hyperlipidemia: LDL was 74 in 07/2023.  She is not on any lipid-lowering therapy at this point in time. Monitored and managed per PCP.   4. Type 2 diabetes: A1c was 9.2 in 07/2023.  Monitored and managed per PCP.   5. CKD stage III: Creatinine was stable at 1.48 in 07/2023.  Repeat BMET pending as above.  Followed by nephrology.    6. Disposition: Follow-up in  Home Medications    Current Outpatient Medications  Medication Sig Dispense Refill   aspirin  EC 81 MG tablet Take 1 tablet (81 mg total) by mouth daily. 30 tablet 3   brimonidine  (ALPHAGAN ) 0.2 % ophthalmic solution Place 1 drop into the right eye 2 (two) times daily. 10 mL 3   carvedilol  (COREG ) 12.5 MG tablet Take 1 tablet (12.5 mg total) by mouth 2 (two) times daily with a meal. 60 tablet 3   Cholecalciferol (VITAMIN D3 PO) Take 1 capsule by mouth daily.     dorzolamide-timolol (COSOPT) 22.3-6.8 MG/ML ophthalmic solution Place 1 drop into both eyes 2 (two) times daily.     empagliflozin (JARDIANCE) 10 MG TABS tablet Take 1 tablet by mouth daily.     furosemide  (LASIX ) 20 MG tablet Take 20 mg by mouth daily.     Ginger, Zingiber officinalis, (GINGER ROOT PO) Take 550 mg by mouth daily.  glipiZIDE (GLUCOTROL) 10 MG tablet Take 10 mg by mouth in the morning and at bedtime.     hydrALAZINE  (APRESOLINE ) 100 MG tablet Take 1 tablet by mouth 3 (three) times daily.     latanoprost (XALATAN) 0.005 % ophthalmic solution Place 1 drop into both eyes at bedtime.     Latanoprostene Bunod (VYZULTA) 0.024 % SOLN Place 1 drop into both eyes at bedtime. QHS OU     LEVEMIR FLEXTOUCH 100 UNIT/ML  FlexTouch Pen Inject 17 Units into the skin 2 (two) times daily.     mupirocin  ointment (BACTROBAN ) 2 % APPLY 1 APPLICATION TOPICALLY DAILY 22 g 0   olmesartan  (BENICAR ) 40 MG tablet Take 1 tablet (40 mg total) by mouth daily. 90 tablet 2   No current facility-administered medications for this visit.     Review of Systems    ***.  All other systems reviewed and are otherwise negative except as noted above.    Physical Exam    VS:  There were no vitals taken for this visit. , BMI There is no height or weight on file to calculate BMI.     GEN: Well nourished, well developed, in no acute distress. HEENT: normal. Neck: Supple, no JVD, carotid bruits, or masses. Cardiac: RRR, no murmurs, rubs, or gallops. No clubbing, cyanosis, edema.  Radials/DP/PT 2+ and equal bilaterally.  Respiratory:  Respirations regular and unlabored, clear to auscultation bilaterally. GI: Soft, nontender, nondistended, BS + x 4. MS: no deformity or atrophy. Skin: warm and dry, no rash. Neuro:  Strength and sensation are intact. Psych: Normal affect.  Accessory Clinical Findings    ECG personally reviewed by me today -    - no acute changes.   Lab Results  Component Value Date   WBC 3.1 (L) 12/12/2021   HGB 11.6 (L) 12/12/2021   HCT 35.7 (L) 12/12/2021   MCV 84.2 12/12/2021   PLT 174 12/12/2021   Lab Results  Component Value Date   CREATININE 1.76 (H) 09/20/2023   BUN 34 (H) 09/20/2023   NA 136 09/20/2023   K 4.7 09/20/2023   CL 104 09/20/2023   CO2 18 (L) 09/20/2023   Lab Results  Component Value Date   ALT 22 12/12/2021   AST 24 12/12/2021   ALKPHOS 63 12/12/2021   BILITOT 0.8 12/12/2021   Lab Results  Component Value Date   CHOL 148 07/08/2010   HDL 76 07/08/2010   LDLCALC 61 07/08/2010   TRIG 54 07/08/2010   CHOLHDL 1.9 Ratio 07/08/2010    Lab Results  Component Value Date   HGBA1C 7.8 (H) 11/19/2021    Assessment & Plan    1.  ***  No BP recorded.  {Refresh Note OR Click  here to enter BP  :1}***   Damien JAYSON Braver, NP 12/27/2023, 11:18 AM

## 2023-12-28 ENCOUNTER — Ambulatory Visit: Admitting: Nurse Practitioner

## 2023-12-28 DIAGNOSIS — E785 Hyperlipidemia, unspecified: Secondary | ICD-10-CM

## 2023-12-28 DIAGNOSIS — E119 Type 2 diabetes mellitus without complications: Secondary | ICD-10-CM

## 2023-12-28 DIAGNOSIS — N1831 Chronic kidney disease, stage 3a: Secondary | ICD-10-CM

## 2023-12-28 DIAGNOSIS — I340 Nonrheumatic mitral (valve) insufficiency: Secondary | ICD-10-CM

## 2023-12-28 DIAGNOSIS — I10 Essential (primary) hypertension: Secondary | ICD-10-CM

## 2024-01-02 ENCOUNTER — Encounter (INDEPENDENT_AMBULATORY_CARE_PROVIDER_SITE_OTHER): Payer: Self-pay | Admitting: Ophthalmology

## 2024-01-02 ENCOUNTER — Ambulatory Visit (INDEPENDENT_AMBULATORY_CARE_PROVIDER_SITE_OTHER): Admitting: Ophthalmology

## 2024-01-02 DIAGNOSIS — Z7984 Long term (current) use of oral hypoglycemic drugs: Secondary | ICD-10-CM | POA: Diagnosis not present

## 2024-01-02 DIAGNOSIS — Z9889 Other specified postprocedural states: Secondary | ICD-10-CM

## 2024-01-02 DIAGNOSIS — H34811 Central retinal vein occlusion, right eye, with macular edema: Secondary | ICD-10-CM

## 2024-01-02 DIAGNOSIS — Z794 Long term (current) use of insulin: Secondary | ICD-10-CM

## 2024-01-02 DIAGNOSIS — H40051 Ocular hypertension, right eye: Secondary | ICD-10-CM

## 2024-01-02 DIAGNOSIS — E113513 Type 2 diabetes mellitus with proliferative diabetic retinopathy with macular edema, bilateral: Secondary | ICD-10-CM | POA: Diagnosis not present

## 2024-01-02 DIAGNOSIS — I10 Essential (primary) hypertension: Secondary | ICD-10-CM

## 2024-01-02 DIAGNOSIS — H35033 Hypertensive retinopathy, bilateral: Secondary | ICD-10-CM

## 2024-01-02 DIAGNOSIS — Z961 Presence of intraocular lens: Secondary | ICD-10-CM

## 2024-01-02 MED ORDER — AFLIBERCEPT 2MG/0.05ML IZ SOLN FOR KALEIDOSCOPE
2.0000 mg | INTRAVITREAL | Status: AC | PRN
  Administered 2024-01-02: 2 mg via INTRAVITREAL

## 2024-01-08 ENCOUNTER — Ambulatory Visit (INDEPENDENT_AMBULATORY_CARE_PROVIDER_SITE_OTHER): Admitting: Podiatry

## 2024-01-08 ENCOUNTER — Encounter: Payer: Self-pay | Admitting: Podiatry

## 2024-01-08 DIAGNOSIS — E1142 Type 2 diabetes mellitus with diabetic polyneuropathy: Secondary | ICD-10-CM

## 2024-01-08 DIAGNOSIS — M79674 Pain in right toe(s): Secondary | ICD-10-CM

## 2024-01-08 DIAGNOSIS — Z89421 Acquired absence of other right toe(s): Secondary | ICD-10-CM | POA: Diagnosis not present

## 2024-01-08 DIAGNOSIS — B351 Tinea unguium: Secondary | ICD-10-CM

## 2024-01-08 DIAGNOSIS — M79675 Pain in left toe(s): Secondary | ICD-10-CM

## 2024-01-08 NOTE — Progress Notes (Signed)
 This patient returns to my office for at risk foot care.  This patient requires this care by a professional since this patient will be at risk due to having diabetes.  This patient is unable to cut nails himself since the patient cannot reach his nails.These nails are painful walking and wearing shoes.  Amputation right big toe. This patient presents for at risk foot care today.  General Appearance  Alert, conversant and in no acute stress.  Vascular  Dorsalis pedis and posterior tibial  pulses are  weakly palpable  bilaterally.  Capillary return is within normal limits  bilaterally. Temperature is within normal limits  bilaterally.  Neurologic  Senn-Weinstein monofilament wire test diminished   bilaterally. Muscle power within normal limits bilaterally.  Nails Thick disfigured discolored nails with subungual debris  from hallux to fifth toes bilaterally. No evidence of bacterial infection or drainage bilaterally.  Orthopedic  No limitations of motion  feet .  No crepitus or effusions noted.  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  Skin  normotropic skin with no porokeratosis noted bilaterally.  No signs of infections or ulcers noted.   [place] durum fifth toes  B/L asymptomatic.  Onychomycosis  Pain in right toes  Pain in left toes   HD fifth toes  B/L.  Consent was obtained for treatment procedures.   Mechanical debridement of nails 1-5  bilaterally performed with a nail nipper.  Filed with dremel without incident.      Return office visit     3 months                 Told patient to return for periodic foot care and evaluation due to potential at risk complications.   Ruffin Cotton DPM

## 2024-01-23 NOTE — Progress Notes (Signed)
 [facility] - Clinic Note  02/06/2024     CHIEF COMPLAINT Patient presents for Retina Follow Up   HISTORY OF PRESENT ILLNESS: Ann Gomez is a 72 y.o. female who presents to the clinic today for:   HPI     Retina Follow Up   Patient presents with  Diabetic Retinopathy.  In both eyes.  This started 5 weeks ago.  I, the attending physician,  performed the HPI with the patient and updated documentation appropriately.        Comments   Patient here for 5 weeks retina follow up for PDR OU. Patient states vision is been doing good. Sees big letters. No eye pain. Been out of blue top drops.       Last edited by Valdemar Rogue, MD on 02/06/2024  8:52 AM.    Patient states   Referring physician: Earvin Johnston PARAS, FNP [address] [place],  [place] [address]  HISTORICAL INFORMATION:   Selected notes from the MEDICAL RECORD NUMBER Dr. Elner pt here due to insurance reasons LEE:  Ocular Hx- PMH-    CURRENT MEDICATIONS: Current Outpatient Medications (Ophthalmic Drugs)  Medication Sig   brimonidine  (ALPHAGAN ) 0.2 % ophthalmic solution Place 1 drop into the right eye 2 (two) times daily.   dorzolamide-timolol (COSOPT) 22.3-6.8 MG/ML ophthalmic solution Place 1 drop into both eyes 2 (two) times daily.   latanoprost (XALATAN) 0.005 % ophthalmic solution Place 1 drop into both eyes at bedtime.   Latanoprostene Bunod (VYZULTA) 0.024 % SOLN Place 1 drop into both eyes at bedtime. QHS OU   Netarsudil-Latanoprost (ROCKLATAN) 0.02-0.005 % SOLN Place 1 drop into both eyes at bedtime.   No current facility-administered medications for this visit. (Ophthalmic Drugs)   Current Outpatient Medications (Other)  Medication Sig   aspirin  EC 81 MG tablet Take 1 tablet (81 mg total) by mouth daily.   carvedilol  (COREG ) 12.5 MG tablet Take 1 tablet (12.5 mg total) by mouth 2 (two) times daily with a meal.   Cholecalciferol (VITAMIN D3 PO) Take 1 capsule by mouth daily.    empagliflozin (JARDIANCE) 10 MG TABS tablet Take 1 tablet by mouth daily.   furosemide  (LASIX ) 20 MG tablet Take 20 mg by mouth daily.   Ginger, Zingiber officinalis, (GINGER ROOT PO) Take 550 mg by mouth daily.   glipiZIDE (GLUCOTROL) 10 MG tablet Take 10 mg by mouth in the morning and at bedtime.   hydrALAZINE  (APRESOLINE ) 100 MG tablet Take 1 tablet by mouth 3 (three) times daily.   LEVEMIR FLEXTOUCH 100 UNIT/ML FlexTouch Pen Inject 17 Units into the skin 2 (two) times daily.   mupirocin  ointment (BACTROBAN ) 2 % APPLY 1 APPLICATION TOPICALLY DAILY   olmesartan  (BENICAR ) 40 MG tablet Take 1 tablet (40 mg total) by mouth daily.   pravastatin  (PRAVACHOL ) 20 MG tablet Take 20 mg by mouth daily.   No current facility-administered medications for this visit. (Other)   REVIEW OF SYSTEMS: ROS   Positive for: Endocrine, Cardiovascular, Eyes Negative for: Constitutional, Gastrointestinal, Neurological, Skin, Genitourinary, Musculoskeletal, HENT, Respiratory, Psychiatric, Allergic/Imm, Heme/Lymph Last edited by Orval Asberry GORMAN, COA on 02/06/2024  8:13 AM.        ALLERGIES Allergies  Allergen Reactions   Hydrochlorothiazide Other (See Comments)    Dizziness  Other Reaction(s): Dizziness   PAST MEDICAL HISTORY Past Medical History:  Diagnosis Date   Diabetes mellitus    Edema    Essential hypertension, benign    Hyperlipidemia  Obesity, unspecified    Past Surgical History:  Procedure Laterality Date   AMPUTATION Right 11/20/2021   Procedure: AMPUTATION GREAT TOE;  Surgeon: Silva Juliene SAUNDERS, DPM;  Location: [facility] OR;  Service: Podiatry;  Laterality: Right;   CATARACT EXTRACTION     CESAREAN SECTION     EYE SURGERY     FAMILY HISTORY Family History  Family history unknown: Yes   SOCIAL HISTORY Social History   Tobacco Use   Smoking status: Never   Smokeless tobacco: Never  Vaping Use   Vaping status: Never Used  Substance Use Topics   Alcohol use: Yes    Comment:  occasional   Drug use: No       OPHTHALMIC EXAM:  Base Eye Exam     Visual Acuity (Snellen - Linear)       Right Left   Dist [place] 20/250 -1 20/20 -2         Tonometry (Tonopen, 8:10 AM)       Right Left   Pressure 21 22         Pupils       Dark Light Shape React APD   Right 3 2 Round Brisk None   Left 3 2 Round Brisk None         Visual Fields (Counting fingers)       Left Right    Full Full         Extraocular Movement       Right Left    Full, Ortho Full, Ortho         Neuro/Psych     Oriented x3: Yes   Mood/Affect: Normal         Dilation     Both eyes: 1.0% Mydriacyl, 2.5% Phenylephrine @ 8:10 AM           Slit Lamp and Fundus Exam     Slit Lamp Exam       Right Left   Lids/Lashes Dermatochalasis - upper lid Dermatochalasis - upper lid   Conjunctiva/Sclera Melanosis Melanosis   Cornea 3+ Punctate epithelial erosions, well healed cataract wound arcus, trace PEE, mild endo pigment, well healed cataract wound   Anterior Chamber deep, clear, narrow angles deep, clear, narrow angles   Iris Round and dilated Round and dilated   Lens PC IOL in good position with open PC PC IOL in good position   Anterior Vitreous mild syneresis post vitrectomy         Fundus Exam       Right Left   Disc 2-3+Pallor, Sharp rim 2+ Pallor, Sharp rim   C/D Ratio 0.4 0.5   Macula Flat, Blunted foveal reflex, scattered IRH/DBH and exudates, focal cystic changes nasal macula--slightly improved, diffuse atrophy Flat, Blunted foveal reflex, scattered MA/DBH greatest temporally, central cystic changes / edema, dense laser temporal macula   Vessels attenuated, Tortuous attenuated, Tortuous   Periphery Attached, scattered MA/DBH and exudates Attached, scattered MA / DBH, scattered patches of PRP with room for fill in           IMAGING AND PROCEDURES  Imaging and Procedures for 02/06/2024  OCT, Retina - OU - Both Eyes       Right Eye Quality was  good. Scan locations included subfoveal. Central Foveal Thickness: 209. Progression has been stable. Findings include normal foveal contour, no SRF, intraretinal hyper-reflective material, intraretinal fluid, inner retinal atrophy, vitreomacular adhesion (Mild cystic changes nasal mac -- persistent, diffuse IRA, patchy ORA ).  Left Eye Quality was good. Scan locations included subfoveal. Central Foveal Thickness: 338. Progression has been stable. Findings include no SRF, abnormal foveal contour, subretinal hyper-reflective material, epiretinal membrane, intraretinal fluid, subretinal fluid (Persistent central cystic changes ).   Notes *Images captured and stored on drive  Diagnosis / Impression:  OD: CRVO -- Mild cystic changes nasal mac -- persistent, diffuse IRA, patchy ORA   OS: +DME -- Persistent central cystic changes  Clinical management:  See below  Abbreviations: NFP - Normal foveal profile. CME - cystoid macular edema. PED - pigment epithelial detachment. IRF - intraretinal fluid. SRF - subretinal fluid. EZ - ellipsoid zone. ERM - epiretinal membrane. ORA - outer retinal atrophy. ORT - outer retinal tubulation. SRHM - subretinal hyper-reflective material. IRHM - intraretinal hyper-reflective material      Intravitreal Injection, Pharmacologic Agent - OD - Right Eye       Time Out 02/06/2024. 8:19 AM. Confirmed correct patient, procedure, site, and patient consented.   Anesthesia Topical anesthesia was used. Anesthetic medications included Lidocaine  2%, Proparacaine 0.5%.   Procedure Preparation included 5% betadine to ocular surface, eyelid speculum. A (32g) needle was used.   Injection: 2 mg aflibercept  2 MG/0.05ML   Route: Intravitreal, Site: Right Eye   NDC: Q956576, Lot: [id number], Expiration date: 02/22/2025, Waste: 0 mL   Post-op Post injection exam found visual acuity of at least counting fingers. The patient tolerated the procedure well. There were no  complications. The patient received written and verbal post procedure care education. Post injection medications were not given.      Intravitreal Injection, Pharmacologic Agent - OS - Left Eye       Time Out 02/06/2024. 8:20 AM. Confirmed correct patient, procedure, site, and patient consented.   Anesthesia Topical anesthesia was used. Anesthetic medications included Lidocaine  2%, Proparacaine 0.5%.   Procedure Preparation included 5% betadine to ocular surface, eyelid speculum. A (32g) needle was used.   Injection: 2 mg aflibercept  2 MG/0.05ML   Route: Intravitreal, Site: Left Eye   NDC: Q956576, Lot: [id number], Expiration date: 03/24/2025, Waste: 0 mL   Post-op Post injection exam found visual acuity of at least counting fingers. The patient tolerated the procedure well. There were no complications. The patient received written and verbal post procedure care education.               ASSESSMENT/PLAN:    ICD-10-CM   1. Central retinal vein occlusion with macular edema of right eye (HCC)  H34.8110 OCT, Retina - OU - Both Eyes    2. Proliferative diabetic retinopathy of both eyes with macular edema associated with type 2 diabetes mellitus (HCC)  Z88.6486 Intravitreal Injection, Pharmacologic Agent - OD - Right Eye    Intravitreal Injection, Pharmacologic Agent - OS - Left Eye    aflibercept  (EYLEA ) SOLN 2 mg    aflibercept  (EYLEA ) SOLN 2 mg    3. Long term (current) use of oral hypoglycemic drugs  Z79.84     4. Encounter for long-term (current) use of insulin  (HCC)  Z79.4     5. History of vitrectomy  Z98.890     6. Essential hypertension  I10     7. Hypertensive retinopathy of both eyes  H35.033     8. Pseudophakia, both eyes  Z96.1     9. Ocular hypertension of right eye  H40.051      1. CRVO w/ CME, OD - delayed f/u -- 6.9 wks instead of 4 (08.13.24 to 09.30.24 -- transportation issues) -  delayed f/u -- 3.5 mos instead of 4 wks due to insurance  issues (3.19.24 to 07.08.24)  - history of multiple IVA OD w/ [facility] (last 06.06.23) - s/p IVA OD #1 (02.20.24), #2 (03.19.24) #3 (07.08.24), #4 (08.13.24), #5 (09.30.24), #6 (10.30.24) -- IVA resistance =================== - s/p IVE OD #1 (02.04.25), #2 (03.11.25), #3 (04.15.25),  #4 (05.16.25), #5 (06.30.25), #6 (07.28.25), #7 (09.09.25)  - BCVA OD 20/250 stable - OCT shows  OD: Mild cystic changes nasal mac--persistent, diffuse IRA, patchy ORA at 5 weeks - recommend IVE OD #7 -- see below  - RBA of procedure discussed, questions answered  - IVE informed consent obtained and signed, 02.04.25 (OU) - see procedure note - f/u 6-7 wks -- DFE/OCT, possible injection  2-5. Proliferative diabetic retinopathy, both eyes  - A1C 7.9 (07.08.25), 9.2 on 04.08.25 - history of PPV w/ membrane peel and laser OS for PDR ([facility] - 06.28.23) - s/p IVA OU #1 (02.20.24), #2 (03.19.24), #3 (07.08.24), #4 (08.13.24), #5 (09.30.24), #6 (10.30.24), #7 (12.06.24), #8 (01.07.25) -- IVA resistance ===================== - s/p IVE OU #1 (02.04.25), #2 (03.11.25), #3 (04.15.25),  #4 (05.16.25), #5 (06.30.25), #6 (07.28.25), #7 (09.09.25) - exam shows scattered MA, DBH and preretinal heme OU; OS with PRP 360 - FA (02.20.24) shows OD: fine NVD; scattered patches of vascular nonperfusion; enlarged FAZ; diffuse leaking MA; OS: Dense cluster of leaking MA's ST mac; staining of PRP laser 360; no active NV - OCT shows OD: Mild cystic changes nasal mac--persistent, diffuse IRA, patchy ORA; OS: Persistent central cystic changes--slightly improved at 5 weeks - recommend IVE today OU #8 (10.14.25) for DME w/ f/u ext to 6-7 wks - pt wishes to proceed with injections - RBA of procedure discussed, questions answered - Eylea  informed consent obtained and signed OU 02.04.25 - see procedure note - f/u in 6-7 wks -- DFE/OCT, possible injection(s)  6,7. Hypertensive retinopathy OU - discussed importance of tight BP control -  continue to monitor  8. Pseudophakia OU  - s/p CE/IOL OU  - IOL in good position, doing well  - continue to monitor  9. Ocular Hypertension OD  - IOP today: 21,22  - on Cosopt BID OU and Rocklatan QHS OU per Dr. Octavia  - Brimonidine  OU BID  - pt ran out of Cosopt  - questionable compliance  Ophthalmic Meds Ordered this visit:  Meds ordered this encounter  Medications   aflibercept  (EYLEA ) SOLN 2 mg   aflibercept  (EYLEA ) SOLN 2 mg     Return for 6-7wks CRVO OD/PDR OS, DFE, OCT, Possible Injxn.  There are no Patient Instructions on file for this visit.  This document serves as a record of services personally performed by Redell JUDITHANN Hans, MD, PhD. It was created on their behalf by Wanda GEANNIE Keens, COT an ophthalmic technician. The creation of this record is the provider's dictation and/or activities during the visit.    Electronically signed by:  Wanda GEANNIE Keens, COT  02/06/24 8:53 AM  This document serves as a record of services personally performed by Redell JUDITHANN Hans, MD, PhD. It was created on their behalf by Almetta Pesa, an ophthalmic technician. The creation of this record is the provider's dictation and/or activities during the visit.    Electronically signed by: Almetta Pesa, OA, 02/06/24  8:53 AM   Redell JUDITHANN Hans, M.D., Ph.D. Diseases & Surgery of the Retina and Vitreous [facility]  I have reviewed the above documentation for accuracy and completeness, and  I agree with the above. Redell JUDITHANN Hans, M.D., Ph.D. 02/06/24 8:55 AM   Abbreviations: M myopia (nearsighted); A astigmatism; H hyperopia (farsighted); P presbyopia; Mrx spectacle prescription;  CTL contact lenses; OD right eye; OS left eye; OU both eyes  XT exotropia; ET esotropia; PEK punctate epithelial keratitis; PEE punctate epithelial erosions; DES dry eye syndrome; MGD meibomian gland dysfunction; ATs artificial tears; PFAT's preservative free artificial tears; NSC  nuclear sclerotic cataract; PSC posterior subcapsular cataract; ERM epi-retinal membrane; PVD posterior vitreous detachment; RD retinal detachment; DM diabetes mellitus; DR diabetic retinopathy; NPDR non-proliferative diabetic retinopathy; PDR proliferative diabetic retinopathy; CSME clinically significant macular edema; DME diabetic macular edema; dbh dot blot hemorrhages; CWS cotton wool spot; POAG primary open angle glaucoma; C/D cup-to-disc ratio; HVF humphrey visual field; GVF goldmann visual field; OCT optical coherence tomography; IOP intraocular pressure; BRVO Branch retinal vein occlusion; CRVO central retinal vein occlusion; CRAO central retinal artery occlusion; BRAO branch retinal artery occlusion; RT retinal tear; SB scleral buckle; PPV pars plana vitrectomy; VH Vitreous hemorrhage; PRP panretinal laser photocoagulation; IVK intravitreal kenalog; VMT vitreomacular traction; MH Macular hole;  NVD neovascularization of the disc; NVE neovascularization elsewhere; AREDS age related eye disease study; ARMD age related macular degeneration; POAG primary open angle glaucoma; EBMD epithelial/anterior basement membrane dystrophy; ACIOL anterior chamber intraocular lens; IOL intraocular lens; PCIOL posterior chamber intraocular lens; Phaco/IOL phacoemulsification with intraocular lens placement; PRK photorefractive keratectomy; LASIK laser assisted in situ keratomileusis; HTN hypertension; DM diabetes mellitus; COPD chronic obstructive pulmonary disease

## 2024-01-29 ENCOUNTER — Encounter: Payer: Self-pay | Admitting: Emergency Medicine

## 2024-01-29 ENCOUNTER — Ambulatory Visit: Attending: Emergency Medicine | Admitting: Emergency Medicine

## 2024-01-29 ENCOUNTER — Other Ambulatory Visit (INDEPENDENT_AMBULATORY_CARE_PROVIDER_SITE_OTHER): Payer: Self-pay | Admitting: Ophthalmology

## 2024-01-29 VITALS — BP 122/58 | HR 76 | Ht 63.5 in | Wt 201.0 lb

## 2024-01-29 DIAGNOSIS — N184 Chronic kidney disease, stage 4 (severe): Secondary | ICD-10-CM

## 2024-01-29 DIAGNOSIS — I10 Essential (primary) hypertension: Secondary | ICD-10-CM

## 2024-01-29 DIAGNOSIS — I340 Nonrheumatic mitral (valve) insufficiency: Secondary | ICD-10-CM | POA: Diagnosis not present

## 2024-01-29 DIAGNOSIS — Z79899 Other long term (current) drug therapy: Secondary | ICD-10-CM

## 2024-01-29 DIAGNOSIS — E782 Mixed hyperlipidemia: Secondary | ICD-10-CM

## 2024-01-29 DIAGNOSIS — E1142 Type 2 diabetes mellitus with diabetic polyneuropathy: Secondary | ICD-10-CM | POA: Diagnosis not present

## 2024-01-29 NOTE — Patient Instructions (Addendum)
 Medication Instructions:  NO CHANGES  Lab Work: NONE TO BE DONE TODAY.  Testing/Procedures: NONE  Follow-Up: At [facility], you and your health needs are our priority.  As part of our continuing mission to provide you with exceptional heart care, our providers are all part of one team.  This team includes your primary Cardiologist (physician) and Advanced Practice Providers or APPs (Physician Assistants and Nurse Practitioners) who all work together to provide you with the care you need, when you need it.  Your next appointment:  6 MONTHS  Provider:   MADISON FOUNTAIN, NP   We recommend signing up for the patient portal called MyChart.  Sign up information is provided on this After Visit Summary.  MyChart is used to connect with patients for Virtual Visits (Telemedicine).  Patients are able to view lab/test results, encounter notes, upcoming appointments, etc.  Non-urgent messages can be sent to your provider as well.   To learn more about what you can do with MyChart, go to [email or web address].

## 2024-01-29 NOTE — Progress Notes (Signed)
 ,  Cardiology Office Note:    Date:  01/29/2024  ID:  Ann Gomez, DOB [birth year], MRN [id number] PCP: Earvin Johnston PARAS, FNP  [facility] Providers Cardiologist:  Joelle VEAR Ren Donley, MD Cardiology APP:  Rana Lum CROME, NP       Patient Profile:       Chief Complaint: Follow-up for hypertension History of Present Illness:  Ann Gomez is a 73 y.o. female with visit-pertinent history of mild mitral valve regurgitation, hypertension, hyperlipidemia, type 2 diabetes, s/p amputation of right great toe, CKD stage III, obesity  She is established with cardiology service on 02/01/2022 with Dr. JAYSON for evaluation of hypertension.  She is found to have evidence of hypervolemia.  Chlorthalidone  was added to medication regimen.  Echocardiogram in 2023 showed LVEF 60 to 65%, grade 1 DD, mildly elevated PASP, mild LAE, mild MR.  Was seen for follow-up on 06/28/2022.  Blood pressure remained elevated despite addition of chlorthalidone .  Amlodipine  5 mg daily was added to regimen.  She was continued on losartan  100 mg daily and hydralazine  25 mg 3 times daily.  She was last seen in clinic on 09/15/2023 by Damien, NP.  She had stopped taking her amlodipine  in the setting of increased edema and her symptoms have not resolved.  She was also intolerant to chlorthalidone .  Her blood pressure was elevated in clinic.  Her losartan  was changed to olmesartan  40 mg daily.  Her carvedilol  was increased to 12.5 mg twice daily.  She was continued on furosemide  20 mg daily and hydralazine  100 mg 3 times daily.   Discussed the use of AI scribe software for clinical note transcription with the patient, who gave verbal consent to proceed.  History of Present Illness Ann Gomez is a 73 year old female who presents for follow-up of her blood pressure management.  Her blood pressure at home ranges from 120s to 140s over 80s.  She did not bring her home blood pressure log into office for evaluation today.   She takes her blood pressure twice daily.  She reports she sees more 120s than most other readings.  She experiences occasional dizziness when standing quickly. There is no chest pain or shortness of breath.  She denies orthopnea, PND, syncope or presyncope.  Her current medications include carvedilol  12.5 mg twice daily, olmesartan  40 mg daily, hydralazine  100 mg three times a day, and Lasix  20 mg once daily. She sometimes misses the afternoon dose of hydralazine . Amlodipine  was previously discontinued due to swelling.  She also noted to have intolerance to chlorthalidone .  She also takes pravastatin  for cholesterol.  She does not smoke and occasionally drinks alcohol. Her diet has improved by avoiding fried foods. She stays active by caring for her great-granddaughter daily.   Review of systems:  Please see the history of present illness. All other systems are reviewed and otherwise negative.      Studies Reviewed:        Echocardiogram 03/08/2022  1. Left ventricular ejection fraction, by estimation, is 60 to 65%. The  left ventricle has normal function. The left ventricle has no regional  wall motion abnormalities. Left ventricular diastolic parameters are  consistent with Grade I diastolic  dysfunction (impaired relaxation).   2. Right ventricular systolic function is normal. The right ventricular  size is normal. There is mildly elevated pulmonary artery systolic  pressure.   3. Left atrial size was mildly dilated.   4. Mild mitral valve regurgitation.  5. The aortic valve is tricuspid. Aortic valve regurgitation is not  visualized.   6. The inferior vena cava is normal in size with greater than 50%  respiratory variability, suggesting right atrial pressure of 3 mmHg.    Risk Assessment/Calculations:              Physical Exam:   VS:  BP (!) 122/58 (BP Location: Right Arm, Patient Position: Sitting, Cuff Size: Normal)   Pulse 76   Ht 5' 3.5 (1.613 m)   Wt 201 lb (91.2  kg)   SpO2 99%   BMI 35.05 kg/m    Wt Readings from Last 3 Encounters:  01/29/24 201 lb (91.2 kg)  09/15/23 206 lb 6.4 oz (93.6 kg)  06/28/22 197 lb 6.4 oz (89.5 kg)    GEN: Well nourished, well developed in no acute distress NECK: No JVD; No carotid bruits CARDIAC: RRR, no murmurs, rubs, gallops RESPIRATORY:  Clear to auscultation without rales, wheezing or rhonchi  ABDOMEN: Soft, non-tender, non-distended EXTREMITIES:  No edema; No acute deformity      Assessment and Plan:  Hypertension Blood pressure today is well-controlled at 122/58 Previous intolerance to amlodipine  and chlorthalidone  - Reports home blood pressures average in the 120s/80s.  Did not bring home BP log in for review.  Remains compliant with medication regimen - Overall blood pressure much better controlled.  No changes today - Continue carvedilol  12.5 mg twice daily - Continue furosemide  20 mg daily - Continue hydralazine  100 mg 3 times daily - Continue olmesartan  40 mg daily  Mitral valve regurgitation Echocardiogram 02/2022 showed mild mitral valve regurgitation - Today patient appears euvolemic on exam.  She denies chest pains, dyspnea, and LEE - There are no indications for further intervention at this time  Hyperlipidemia LDL 95 on 10/2023 - Continue pravastatin  20 mg daily  Type 2 diabetes A1c 7.9% on 10/2023 and uncontrolled - Managed on glipizide, Jardiance, and Levemir per PCP  CKD stage IV Creatinine 2.16 and GFR 23 on 12/2023 - Stable.  Managed by nephrology      Dispo:  Return in about 6 months (around 07/29/2024).  Signed, Lum LITTIE Louis, NP

## 2024-02-05 ENCOUNTER — Other Ambulatory Visit (INDEPENDENT_AMBULATORY_CARE_PROVIDER_SITE_OTHER): Payer: Self-pay

## 2024-02-05 MED ORDER — BRIMONIDINE TARTRATE 0.2 % OP SOLN: 1.0000 [drp] | Freq: Two times a day (BID) | OPHTHALMIC | 3 refills | Status: AC

## 2024-02-06 ENCOUNTER — Encounter (INDEPENDENT_AMBULATORY_CARE_PROVIDER_SITE_OTHER): Payer: Self-pay | Admitting: Ophthalmology

## 2024-02-06 ENCOUNTER — Ambulatory Visit (INDEPENDENT_AMBULATORY_CARE_PROVIDER_SITE_OTHER): Admitting: Ophthalmology

## 2024-02-06 DIAGNOSIS — Z794 Long term (current) use of insulin: Secondary | ICD-10-CM | POA: Diagnosis not present

## 2024-02-06 DIAGNOSIS — Z961 Presence of intraocular lens: Secondary | ICD-10-CM

## 2024-02-06 DIAGNOSIS — E113513 Type 2 diabetes mellitus with proliferative diabetic retinopathy with macular edema, bilateral: Secondary | ICD-10-CM

## 2024-02-06 DIAGNOSIS — Z7984 Long term (current) use of oral hypoglycemic drugs: Secondary | ICD-10-CM | POA: Diagnosis not present

## 2024-02-06 DIAGNOSIS — Z9889 Other specified postprocedural states: Secondary | ICD-10-CM

## 2024-02-06 DIAGNOSIS — H35033 Hypertensive retinopathy, bilateral: Secondary | ICD-10-CM

## 2024-02-06 DIAGNOSIS — H34811 Central retinal vein occlusion, right eye, with macular edema: Secondary | ICD-10-CM | POA: Diagnosis not present

## 2024-02-06 DIAGNOSIS — H40051 Ocular hypertension, right eye: Secondary | ICD-10-CM

## 2024-02-06 DIAGNOSIS — I10 Essential (primary) hypertension: Secondary | ICD-10-CM

## 2024-02-06 MED ORDER — AFLIBERCEPT 2MG/0.05ML IZ SOLN FOR KALEIDOSCOPE
2.0000 mg | INTRAVITREAL | Status: AC | PRN
  Administered 2024-02-06: 2 mg via INTRAVITREAL

## 2024-02-14 ENCOUNTER — Telehealth: Payer: Self-pay | Admitting: Emergency Medicine

## 2024-02-14 MED ORDER — PRAVASTATIN SODIUM 20 MG PO TABS: 20.0000 mg | ORAL_TABLET | Freq: Every day | ORAL | 3 refills | Status: AC

## 2024-02-14 NOTE — Telephone Encounter (Signed)
 Pt's medication was sent to pt's pharmacy as requested. Confirmation received.

## 2024-02-14 NOTE — Telephone Encounter (Signed)
*  STAT* If patient is at the pharmacy, call can be transferred to refill team.   1. Which medications need to be refilled? (please list name of each medication and dose if known)   pravastatin  (PRAVACHOL ) 20 MG tablet     2. Would you like to learn more about the convenience, safety, & potential cost savings by using the [facility]? NO    3. Are you open to using the [facility] ([facility]. No    4. Which pharmacy/location (including street and city if local pharmacy) is medication to be sent to? [facility] - , [place] - [address]     5. Do they need a 30 day or 90 day supply? 90

## 2024-03-12 NOTE — Progress Notes (Shared)
 [facility] - Clinic Note  03/26/2024     CHIEF COMPLAINT Patient presents for No chief complaint on file.   HISTORY OF PRESENT ILLNESS: Ann Gomez is a 73 y.o. female who presents to the clinic today for:    Patient states   Referring physician: Earvin Johnston PARAS, FNP [address] [place],  [place] [address]  HISTORICAL INFORMATION:   Selected notes from the MEDICAL RECORD NUMBER Dr. Elner pt here due to insurance reasons LEE:  Ocular Hx- PMH-    CURRENT MEDICATIONS: Current Outpatient Medications (Ophthalmic Drugs)  Medication Sig   brimonidine  (ALPHAGAN ) 0.2 % ophthalmic solution Place 1 drop into the right eye 2 (two) times daily.   dorzolamide-timolol (COSOPT) 22.3-6.8 MG/ML ophthalmic solution Place 1 drop into both eyes 2 (two) times daily.   latanoprost (XALATAN) 0.005 % ophthalmic solution Place 1 drop into both eyes at bedtime.   Latanoprostene Bunod (VYZULTA) 0.024 % SOLN Place 1 drop into both eyes at bedtime. QHS OU   Netarsudil-Latanoprost (ROCKLATAN) 0.02-0.005 % SOLN Place 1 drop into both eyes at bedtime.   No current facility-administered medications for this visit. (Ophthalmic Drugs)   Current Outpatient Medications (Other)  Medication Sig   aspirin  EC 81 MG tablet Take 1 tablet (81 mg total) by mouth daily.   carvedilol  (COREG ) 12.5 MG tablet Take 1 tablet (12.5 mg total) by mouth 2 (two) times daily with a meal.   Cholecalciferol (VITAMIN D3 PO) Take 1 capsule by mouth daily.   empagliflozin (JARDIANCE) 10 MG TABS tablet Take 1 tablet by mouth daily.   furosemide  (LASIX ) 20 MG tablet Take 20 mg by mouth daily.   Ginger, Zingiber officinalis, (GINGER ROOT PO) Take 550 mg by mouth daily.   glipiZIDE (GLUCOTROL) 10 MG tablet Take 10 mg by mouth in the morning and at bedtime.   hydrALAZINE  (APRESOLINE ) 100 MG tablet Take 1 tablet by mouth 3 (three) times daily.   LEVEMIR FLEXTOUCH 100 UNIT/ML FlexTouch Pen Inject 17 Units into  the skin 2 (two) times daily.   mupirocin  ointment (BACTROBAN ) 2 % APPLY 1 APPLICATION TOPICALLY DAILY   olmesartan  (BENICAR ) 40 MG tablet Take 1 tablet (40 mg total) by mouth daily.   pravastatin  (PRAVACHOL ) 20 MG tablet Take 1 tablet (20 mg total) by mouth daily.   No current facility-administered medications for this visit. (Other)   REVIEW OF SYSTEMS:      ALLERGIES Allergies  Allergen Reactions   Hydrochlorothiazide Other (See Comments)    Dizziness  Other Reaction(s): Dizziness   PAST MEDICAL HISTORY Past Medical History:  Diagnosis Date   Diabetes mellitus    Edema    Essential hypertension, benign    Hyperlipidemia    Obesity, unspecified    Past Surgical History:  Procedure Laterality Date   AMPUTATION Right 11/20/2021   Procedure: AMPUTATION GREAT TOE;  Surgeon: Silva Juliene SAUNDERS, DPM;  Location: [facility] OR;  Service: Podiatry;  Laterality: Right;   CATARACT EXTRACTION     CESAREAN SECTION     EYE SURGERY     FAMILY HISTORY Family History  Family history unknown: Yes   SOCIAL HISTORY Social History   Tobacco Use   Smoking status: Never   Smokeless tobacco: Never  Vaping Use   Vaping status: Never Used  Substance Use Topics   Alcohol use: Yes    Comment: occasional   Drug use: No       OPHTHALMIC EXAM:  Not recorded  IMAGING AND PROCEDURES  Imaging and Procedures for 03/26/2024             ASSESSMENT/PLAN:  No diagnosis found.  1. CRVO w/ CME, OD - delayed f/u -- 6.9 wks instead of 4 (08.13.24 to 09.30.24 -- transportation issues) - delayed f/u -- 3.5 mos instead of 4 wks due to insurance issues (3.19.24 to 07.08.24)  - history of multiple IVA OD w/ [facility] (last 06.06.23) - s/p IVA OD #1 (02.20.24), #2 (03.19.24) #3 (07.08.24), #4 (08.13.24), #5 (09.30.24), #6 (10.30.24) -- IVA resistance =================== - s/p IVE OD #1 (02.04.25), #2 (03.11.25), #3 (04.15.25),  #4 (05.16.25), #5 (06.30.25), #6 (07.28.25), #7 (09.09.25)  -  BCVA OD 20/250 stable - OCT shows  OD: Mild cystic changes nasal mac--persistent, diffuse IRA, patchy ORA at 5 weeks - recommend IVE OD #7 -- see below  - RBA of procedure discussed, questions answered  - IVE informed consent obtained and signed, 02.04.25 (OU) - see procedure note - f/u 6-7 wks -- DFE/OCT, possible injection  2-5. Proliferative diabetic retinopathy, both eyes  - A1C 7.9 (07.08.25), 9.2 on 04.08.25 - history of PPV w/ membrane peel and laser OS for PDR ([facility] - 06.28.23) - s/p IVA OU #1 (02.20.24), #2 (03.19.24), #3 (07.08.24), #4 (08.13.24), #5 (09.30.24), #6 (10.30.24), #7 (12.06.24), #8 (01.07.25) -- IVA resistance ===================== - s/p IVE OU #1 (02.04.25), #2 (03.11.25), #3 (04.15.25),  #4 (05.16.25), #5 (06.30.25), #6 (07.28.25), #7 (09.09.25), #8 (10.14.25) - exam shows scattered MA, DBH and preretinal heme OU; OS with PRP 360 - FA (02.20.24) shows OD: fine NVD; scattered patches of vascular nonperfusion; enlarged FAZ; diffuse leaking MA; OS: Dense cluster of leaking MA's ST mac; staining of PRP laser 360; no active NV - OCT shows OD: Mild cystic changes nasal mac--persistent, diffuse IRA, patchy ORA; OS: Persistent central cystic changes--slightly improved at 5 weeks - recommend IVE today OU #9 (12.02.25) for DME w/ f/u ext to 6-7 wks - pt wishes to proceed with injections - RBA of procedure discussed, questions answered - Eylea  informed consent obtained and signed OU 02.04.25 - see procedure note - f/u in 6-7 wks -- DFE/OCT, possible injection(s)  6,7. Hypertensive retinopathy OU - discussed importance of tight BP control - continue to monitor  8. Pseudophakia OU  - s/p CE/IOL OU  - IOL in good position, doing well  - continue to monitor  9. Ocular Hypertension OD  - IOP today: 21,22  - on Cosopt BID OU and Rocklatan QHS OU per Dr. Octavia  - Brimonidine  OU BID  - pt ran out of Cosopt  - questionable compliance  Ophthalmic Meds Ordered this visit:   No orders of the defined types were placed in this encounter.    No follow-ups on file.  There are no Patient Instructions on file for this visit.  This document serves as a record of services personally performed by Redell JUDITHANN Hans, MD, PhD. It was created on their behalf by Wanda GEANNIE Keens, COT an ophthalmic technician. The creation of this record is the provider's dictation and/or activities during the visit.    Electronically signed by:  Wanda GEANNIE Keens, COT  03/12/24 7:20 AM     Redell JUDITHANN Hans, M.D., Ph.D. Diseases & Surgery of the Retina and Vitreous [facility]   Abbreviations: M myopia (nearsighted); A astigmatism; H hyperopia (farsighted); P presbyopia; Mrx spectacle prescription;  CTL contact lenses; OD right eye; OS left eye; OU both eyes  XT exotropia;  ET esotropia; PEK punctate epithelial keratitis; PEE punctate epithelial erosions; DES dry eye syndrome; MGD meibomian gland dysfunction; ATs artificial tears; PFAT's preservative free artificial tears; NSC nuclear sclerotic cataract; PSC posterior subcapsular cataract; ERM epi-retinal membrane; PVD posterior vitreous detachment; RD retinal detachment; DM diabetes mellitus; DR diabetic retinopathy; NPDR non-proliferative diabetic retinopathy; PDR proliferative diabetic retinopathy; CSME clinically significant macular edema; DME diabetic macular edema; dbh dot blot hemorrhages; CWS cotton wool spot; POAG primary open angle glaucoma; C/D cup-to-disc ratio; HVF humphrey visual field; GVF goldmann visual field; OCT optical coherence tomography; IOP intraocular pressure; BRVO Branch retinal vein occlusion; CRVO central retinal vein occlusion; CRAO central retinal artery occlusion; BRAO branch retinal artery occlusion; RT retinal tear; SB scleral buckle; PPV pars plana vitrectomy; VH Vitreous hemorrhage; PRP panretinal laser photocoagulation; IVK intravitreal kenalog; VMT vitreomacular traction; MH Macular  hole;  NVD neovascularization of the disc; NVE neovascularization elsewhere; AREDS age related eye disease study; ARMD age related macular degeneration; POAG primary open angle glaucoma; EBMD epithelial/anterior basement membrane dystrophy; ACIOL anterior chamber intraocular lens; IOL intraocular lens; PCIOL posterior chamber intraocular lens; Phaco/IOL phacoemulsification with intraocular lens placement; PRK photorefractive keratectomy; LASIK laser assisted in situ keratomileusis; HTN hypertension; DM diabetes mellitus; COPD chronic obstructive pulmonary disease

## 2024-03-26 ENCOUNTER — Encounter (INDEPENDENT_AMBULATORY_CARE_PROVIDER_SITE_OTHER): Admitting: Ophthalmology

## 2024-03-26 DIAGNOSIS — H40051 Ocular hypertension, right eye: Secondary | ICD-10-CM

## 2024-03-26 DIAGNOSIS — I10 Essential (primary) hypertension: Secondary | ICD-10-CM

## 2024-03-26 DIAGNOSIS — E113513 Type 2 diabetes mellitus with proliferative diabetic retinopathy with macular edema, bilateral: Secondary | ICD-10-CM

## 2024-03-26 DIAGNOSIS — Z9889 Other specified postprocedural states: Secondary | ICD-10-CM

## 2024-03-26 DIAGNOSIS — Z794 Long term (current) use of insulin: Secondary | ICD-10-CM

## 2024-03-26 DIAGNOSIS — Z7984 Long term (current) use of oral hypoglycemic drugs: Secondary | ICD-10-CM

## 2024-03-26 DIAGNOSIS — H34811 Central retinal vein occlusion, right eye, with macular edema: Secondary | ICD-10-CM

## 2024-03-26 DIAGNOSIS — H35033 Hypertensive retinopathy, bilateral: Secondary | ICD-10-CM

## 2024-03-26 DIAGNOSIS — Z961 Presence of intraocular lens: Secondary | ICD-10-CM

## 2024-04-08 ENCOUNTER — Telehealth: Payer: Self-pay

## 2024-04-08 ENCOUNTER — Ambulatory Visit: Admitting: Podiatry

## 2024-04-08 NOTE — Telephone Encounter (Signed)
 Patient called in regards to a missed appointment for 04/08/24. Pt states calling on 03/06/24 to reschedule appointment due to eye surgery. Pt does not want to be charged a no-show fee and would like to reschedule for January with same provider.

## 2024-05-09 ENCOUNTER — Encounter: Payer: Self-pay | Admitting: Podiatry

## 2024-05-09 ENCOUNTER — Ambulatory Visit: Admitting: Podiatry

## 2024-05-09 DIAGNOSIS — M79674 Pain in right toe(s): Secondary | ICD-10-CM | POA: Diagnosis not present

## 2024-05-09 DIAGNOSIS — Z89421 Acquired absence of other right toe(s): Secondary | ICD-10-CM

## 2024-05-09 DIAGNOSIS — B351 Tinea unguium: Secondary | ICD-10-CM

## 2024-05-09 DIAGNOSIS — E1142 Type 2 diabetes mellitus with diabetic polyneuropathy: Secondary | ICD-10-CM | POA: Diagnosis not present

## 2024-05-09 DIAGNOSIS — M79675 Pain in left toe(s): Secondary | ICD-10-CM | POA: Diagnosis not present

## 2024-05-09 NOTE — Progress Notes (Signed)
 This patient returns to my office for at risk foot care.  This patient requires this care by a professional since this patient will be at risk due to having CKD and diabetes.  This patient is unable to cut nails herself since the patient cannot reach her nails.These nails are painful walking and wearing shoes.  This patient presents for at risk foot care today.  General Appearance  Alert, conversant and in no acute stress.  Vascular  Dorsalis pedis and posterior tibial  pulses are palpable  bilaterally.  Capillary return is within normal limits  bilaterally. Temperature is within normal limits  bilaterally.  Neurologic  Senn-Weinstein monofilament wire test within normal limits  bilaterally. Muscle power within normal limits bilaterally.  Nails Thick disfigured discolored nails with subungual debris  from hallux to fifth toes bilaterally. No evidence of bacterial infection or drainage bilaterally.  Orthopedic  No limitations of motion  feet .  No crepitus or effusions noted.  No bony pathology or digital deformities noted.  Skin  normotropic skin with no porokeratosis noted bilaterally.  No signs of infections or ulcers noted.     Onychomycosis  Pain in right toes  Pain in left toes  Consent was obtained for treatment procedures.   Mechanical debridement of nails 1-5  bilaterally performed with a nail nipper.  Filed with dremel without incident.    Return office visit    3 months                  Told patient to return for periodic foot care and evaluation due to potential at risk complications.   Cordella Bold ROSENA  [place]eldon

## 2024-07-09 ENCOUNTER — Other Ambulatory Visit (HOSPITAL_BASED_OUTPATIENT_CLINIC_OR_DEPARTMENT_OTHER)

## 2024-08-07 ENCOUNTER — Ambulatory Visit: Admitting: Podiatry
